# Patient Record
Sex: Male | Born: 1935 | Race: White | Hispanic: No | Smoking: Former smoker
Health system: Southern US, Community
[De-identification: ages and names within clinical notes are randomized; demographics above are authoritative.]

## PROBLEM LIST (undated history)

## (undated) DIAGNOSIS — E119 Type 2 diabetes mellitus without complications: Secondary | ICD-10-CM

## (undated) DIAGNOSIS — H9190 Unspecified hearing loss, unspecified ear: Secondary | ICD-10-CM

## (undated) DIAGNOSIS — I2111 ST elevation (STEMI) myocardial infarction involving right coronary artery: Secondary | ICD-10-CM

## (undated) DIAGNOSIS — C679 Malignant neoplasm of bladder, unspecified: Secondary | ICD-10-CM

## (undated) DIAGNOSIS — I2510 Atherosclerotic heart disease of native coronary artery without angina pectoris: Secondary | ICD-10-CM

## (undated) DIAGNOSIS — F0390 Unspecified dementia without behavioral disturbance: Secondary | ICD-10-CM

## (undated) DIAGNOSIS — C4490 Unspecified malignant neoplasm of skin, unspecified: Secondary | ICD-10-CM

## (undated) DIAGNOSIS — I10 Essential (primary) hypertension: Secondary | ICD-10-CM

## (undated) HISTORY — DX: ST elevation (STEMI) myocardial infarction involving right coronary artery: I21.11

## (undated) HISTORY — PX: EYE SURGERY: SHX253

## (undated) HISTORY — PX: PROSTATE SURGERY: SHX751

## (undated) HISTORY — PX: HERNIA REPAIR: SHX51

## (undated) HISTORY — PX: APPENDECTOMY: SHX54

## (undated) HISTORY — PX: CHOLECYSTECTOMY: SHX55

---

## 1998-02-12 ENCOUNTER — Encounter: Admission: RE | Admit: 1998-02-12 | Discharge: 1998-05-13 | Payer: Self-pay | Admitting: Internal Medicine

## 2000-06-29 DIAGNOSIS — C4490 Unspecified malignant neoplasm of skin, unspecified: Secondary | ICD-10-CM

## 2000-06-29 HISTORY — DX: Unspecified malignant neoplasm of skin, unspecified: C44.90

## 2005-08-19 ENCOUNTER — Encounter: Admission: RE | Admit: 2005-08-19 | Discharge: 2005-08-19 | Payer: Self-pay | Admitting: Orthopedic Surgery

## 2005-08-20 ENCOUNTER — Ambulatory Visit (HOSPITAL_BASED_OUTPATIENT_CLINIC_OR_DEPARTMENT_OTHER): Admission: RE | Admit: 2005-08-20 | Discharge: 2005-08-20 | Payer: Self-pay | Admitting: Orthopedic Surgery

## 2006-06-29 DIAGNOSIS — I2111 ST elevation (STEMI) myocardial infarction involving right coronary artery: Secondary | ICD-10-CM

## 2006-06-29 HISTORY — PX: CORONARY ANGIOPLASTY WITH STENT PLACEMENT: SHX49

## 2006-06-29 HISTORY — DX: ST elevation (STEMI) myocardial infarction involving right coronary artery: I21.11

## 2006-12-18 ENCOUNTER — Ambulatory Visit: Payer: Self-pay | Admitting: *Deleted

## 2006-12-18 ENCOUNTER — Inpatient Hospital Stay (HOSPITAL_COMMUNITY): Admission: EM | Admit: 2006-12-18 | Discharge: 2006-12-20 | Payer: Self-pay | Admitting: Emergency Medicine

## 2006-12-30 ENCOUNTER — Ambulatory Visit: Payer: Self-pay | Admitting: Cardiology

## 2007-01-05 ENCOUNTER — Ambulatory Visit (HOSPITAL_COMMUNITY): Admission: RE | Admit: 2007-01-05 | Discharge: 2007-01-06 | Payer: Self-pay | Admitting: Urology

## 2007-01-18 ENCOUNTER — Ambulatory Visit: Payer: Self-pay | Admitting: Cardiology

## 2007-01-18 LAB — CONVERTED CEMR LAB
AST: 18 units/L (ref 0–37)
Alkaline Phosphatase: 102 units/L (ref 39–117)
Bilirubin, Direct: 0.1 mg/dL (ref 0.0–0.3)
Chloride: 101 meq/L (ref 96–112)
Glucose, Bld: 185 mg/dL — ABNORMAL HIGH (ref 70–99)
HDL: 24.7 mg/dL — ABNORMAL LOW (ref >39.0)
LDL Cholesterol: 48 mg/dL (ref 0–99)
Potassium: 4.3 meq/L (ref 3.5–5.1)
Total Bilirubin: 1.3 mg/dL — ABNORMAL HIGH (ref 0.3–1.2)
Total CHOL/HDL Ratio: 4.5
Total Protein: 7.1 g/dL (ref 6.0–8.3)

## 2008-03-07 ENCOUNTER — Emergency Department (HOSPITAL_COMMUNITY): Admission: EM | Admit: 2008-03-07 | Discharge: 2008-03-07 | Payer: Self-pay | Admitting: Emergency Medicine

## 2008-03-09 ENCOUNTER — Emergency Department (HOSPITAL_COMMUNITY): Admission: EM | Admit: 2008-03-09 | Discharge: 2008-03-09 | Payer: Self-pay | Admitting: Family Medicine

## 2009-04-23 ENCOUNTER — Ambulatory Visit: Admission: RE | Admit: 2009-04-23 | Discharge: 2009-06-12 | Payer: Self-pay | Admitting: Radiation Oncology

## 2010-11-11 NOTE — Op Note (Signed)
NAME:  NUR, KRASINSKI                 ACCOUNT NO.:  [id number]   MEDICAL RECORD NO.:  [id number]          PATIENT TYPE:  OIB   LOCATION:  1406                         FACILITY:  [facility]   PHYSICIAN:  Sigmund I. Tannenbaum, M.D.DATE OF BIRTH:  1926/12/16   DATE OF PROCEDURE:  DATE OF DISCHARGE:                               OPERATIVE REPORT   PREOP DIAGNOSIS:  Acute urinary retention secondary to clot retention,  secondary to Plavix for cardiac stent.   POSTOP DIAGNOSIS:  Acute urinary retention secondary to clot retention,  secondary to Plavix for cardiac stent.   OPERATION:  Cystourethroscopy, clot evacuation, catheterization.   SURGEON:  Tannenbaum.   ANESTHESIA:  General LMA.   PREPARATION:  After appropriate preanesthesia, the patient is brought to  the operating room, placed on the operating table in the dorsal supine  position where general LMA anesthesia was induced.  He was then replaced  in the dorsal lithotomy position.  The pubis was prepped with Betadine  solution and draped in the usual fashion.   HISTORY:  This 77 year old male was status post TURP approximately 20  years per Dr. Etta Grandchild.  More recently, the patient has had a cardiac stent  placed, on Plavix.  He developed difficulty voiding over the weekend,  with gross hematuria.  He currently has urinary clot retention, and  attempts at catheterization in the office have failed.  Cystoscopy in  the office failed because of massive of clot retention.  The patient has  significant pain as well as increased pulse rate, and he is considered  emergency for surgical evacuation of bladder clot.   PROCEDURE:  Cystourethroscopy was accomplished, and the bladder neck  with contracture as identified.  A large amount of clot is identified.  The scope was placed in the bladder, the bladder evacuated of at least 1  unit of old crank case oil blood, along with more recent clots.  Following complete irrigation of clots, repeat  cystoscopy was  accomplished, and showed massive trabeculation, cellule formation, with  regrowth of prostate, bladder neck  contracture.  A guidewire was placed in the bladder, and a 24- Ainsworth  catheter was placed into the bladder with some difficulty.  Irrigation  was accomplished and showed clear results.  The patient was then  awakened and taken to recovery room in good condition.  20 mL were  placed in the Ainsworth catheter.      Sigmund I. Patsi Sears, M.D.  Electronically Signed     SIT/MEDQ  D:  01/05/2007  T:  01/06/2007  Job:  [id number]   cc:   Larina Earthly, M.D.  Fax: [phone]   Everardo Beals. Juanda Chance, MD, [facility]  [address]  [place], [place] [address]

## 2010-11-11 NOTE — Assessment & Plan Note (Signed)
[name]                            CARDIOLOGY OFFICE NOTE   [name], [name]                        MRN:          [id number]  DATE:12/30/2006                            DOB:          12/15/1926    PRIMARY CARE PHYSICIAN:  [name] [name], M.D.   CLINICAL HISTORY:  [name] [name] is 77 years old and came to [facility] via EMS on June 21 with an acute diaphragmatic wall infarction.  He was taken to the cath lab and we stented a subtotal lesion in the  posterior descending branch of the right coronary artery.  He had  residual 80% ostial narrowing in the marginal branch and 80% narrowing  in the distal circumflex artery, which we felt should best be treated  medically.  His overall LV function was good with estimated ejection  fraction of 60%.   He has done quite well since that time, has had no recent chest pain,  shortness of breath or palpitations.  He has been out working in the  yard a little bit already.   PAST MEDICAL HISTORY:  Significant for hyperlipidemia and diabetes.   He has been intolerant to lisinopril.   CURRENT MEDICATIONS INCLUDE:  1. Aspirin.  2. Plavix.  3. Toprol XL 50 mg daily.  4. Avapro 75 mg daily.  5. Lipitor 80 mg daily.  6. Glipizide.   SOCIAL HISTORY:  His wife died in 15-Jul-2022.  They had been married for 57  years.  She had diabetes and amputations and was on dialysis.   ON EXAMINATION:  The blood pressure is 161/78 and the pulse 78 and  regular.  There was no jugular venous distention.  The carotid pulses  were full without bruit.  Chest was clear without rales or rhonchi.  Cardiac rhythm was regular.  The heart sounds were normal, there were no  murmurs or gallops.  There abdomen was soft, without organomegaly.  Peripheral pulses were full and there was no peripheral edema.   Electrocardiogram showed recent diaphragmatic wall infarction.   IMPRESSION:  1. Recent diaphragmatic wall infarction (December 18, 2006), treated with      a Taxus drug eluting stent to the posterior descending branch of      the right coronary artery with residual 80% ostial stenosis of a      marginal branch and 80% distal stenosis in the circumflex artery.  2. Good left ventricular function with ejection fraction of 60%.  3. Diabetes.  4. Hypertension.  5. Hyperlipidemia.   RECOMMENDATIONS:  I think [name] [name] is doing quite well.  We will plan  to check a lipid profile in three weeks to follow up on his recent  initiation of Lipitor.  We will get liver function tests at the same  time.  He has a followup to see [name] [name] in two months and I will plan  to see him back in three months.  His blood pressure was somewhat  elevated today and his heart rate somewhat elevated, as well, so we will  go up on the Toprol  from 50 to 75 a day.  He is to check his pressures  at home and let us or [name] [name] know if his pressures run more than 140  systolic more than occasionally.     [name] Elvera Lennox Juanda Chance, MD, [name] [name]  Electronically Signed    BRB/MedQ  DD: 12/30/2006  DT: 12/31/2006  Job #: [id number]   cc:   [name] [name], M.D.

## 2010-11-11 NOTE — H&P (Signed)
NAME:  [name], [name] NO.:  [id number]   MEDICAL RECORD NO.:  [id number]          PATIENT TYPE:  INP   LOCATION:  1828                         FACILITY:  [facility]   PHYSICIAN:  Rod Holler, MD     DATE OF BIRTH:  12/03/1926   DATE OF ADMISSION:  12/18/2006  DATE OF DISCHARGE:                              HISTORY & PHYSICAL   PRIMARY CARE Korion Cuevas:  Dr. Felipa Eth.   CHIEF COMPLAINT:  Chest pain.   HISTORY OF PRESENT ILLNESS:  [name] [name] is a 77 year old male with a  history of diabetes mellitus who presented to the emergency department  via EMS with complaints of chest pain.  About an hour prior to  presenting to the emergency department, the patient had onset of left-  sided substernal chest discomfort.  There was no associated nausea,  diaphoresis, shortness of breath  he had never had chest pain prior to  this episode.  At home the patient took 4 baby aspirin.  He had no  complaints of syncope, no presyncope, no palpitations.  He has had no  recent PND or orthopnea, no lower extremity swelling, no dyspnea on  exertion.  EMS was activated, and inferior ST elevation was noted by  EMS.  A code STEMI was activated.  In the emergency department the  patient received heparin 3000 units IV, Pepcid 20 mg IV, Plavix 600 mg  p.o.  He continued to have 5/10 chest discomfort.   PAST MEDICAL HISTORY:  Diabetes mellitus.   MEDICATIONS:  1. Metformin 500 mg p.o. b.i.d.  2. Lisinopril 10 mg p.o. daily.   ALLERGIES:  No known drug allergies.   SOCIAL HISTORY:  The patient is a nonsmoker and lives at home.   FAMILY HISTORY:  No known history of coronary artery disease.   REVIEW OF SYSTEMS:  All systems are reviewed in detail and are negative  except as noted in the History of Present Illness.   PHYSICAL EXAM:  VITAL SIGNS: Heart rate 81, blood pressure 119/60/  GENERAL: Well-developed, well-nourished, alert and oriented x3, no  apparent distress.  HEENT: Atraumatic,  normocephalic.  Pupils equal, round, and reactive to  light.  Extraocular movements intact.  NECK: Supple, no adenopathy, no JVD, no carotid bruits.  CHEST: Lungs clear to auscultation bilaterally with equal bilateral  breath sounds.  CORONARY: Regular rhythm, normal rate, normal S1/S2.  No murmurs, rubs  or gallops, 2+ peripheral pulses, 2+ right femoral pulse without bruit.  ABDOMEN: Soft, nontender, nondistended.  Active bowel sounds.  EXTREMITIES: No clubbing, cyanosis or edema.  NEUROLOGIC: No focal deficits.   EKG shows sinus rhythm, inferior ST elevation with bilateral ST segment  depressions.   LABS:  Pending.   IMPRESSION:  Inferior ST-elevation myocardial infarction.   PLAN:  1. To the cardiac catheterization lab emergently.  2. Admit to CCU.  Serial cardiac enzymes, aspirin, Plavix daily, ACE-      inhibitor daily, beta-blocker, Lipitor daily.  We will check a      lipid panel.  3. Sliding scale insulin given his history of diabetes mellitus.  4. Labs to include CMP, CBC, magnesium level, BMP.      Rod Holler, MD  Electronically Signed     TRK/MEDQ  D:  12/18/2006  T:  12/18/2006  Job:  [id number]

## 2010-11-11 NOTE — Cardiovascular Report (Signed)
NAME:  [name] [name], [name] [name] NO.:  [id number]   MEDICAL RECORD NO.:  [id number]          PATIENT TYPE:  INP   LOCATION:  2033                         FACILITY:  [facility]   PHYSICIAN:  Bruce R. Juanda Chance, MD, FACCDATE OF BIRTH:  05-22-1927   DATE OF PROCEDURE:  12/18/2006  DATE OF DISCHARGE:                            CARDIAC CATHETERIZATION   PROCEDURE:  Cardiac catheterization and percutaneous intervention.   PAST MEDICAL HISTORY:  Mr. [name] [name] is 77 years old and is a retired  Agricultural engineer.  There is no prior history of  heart disease.  He developed chest pain at home and called EMS and was  brought to [facility].  A code STEMI was called en route for  EKG changes of an inferior infarction.  He was seen by Dr. Joaquim Lai,  our cardiology fellow; and transported promptly to the catheterization  laboratory.   DESCRIPTION OF PROCEDURE:  The procedure was performed via right femoral  artery and arterial sheath and 6-French preformed coronary catheters.  A  front wall arterial puncture was performed and Omnipaque contrast was  used.  After completion of the diagnostic study I made decision to  proceed with intervention on the lesion in the posterior descending  branch in the right coronary.   The patient had been given chewable aspirin and Plavix earlier.  We gave  Angiomax bolus and infusion.  We chose a right saphenous vein bypass  graft guiding catheter 6-French with side holes.  We passed a Prowater  wire down the vessel and across the lesion without difficulty.  We  predilated with a 2.25 x 20 mm Maverick balloon performing two  inflations up to 8 atmospheres for 30 seconds.  This resulted in some  slow flow which was treated by intracoronary verapamil.  (TIMI 2 flow).  We then deployed a 2.5 x 20 mm Taxus stent deploying this with one  inflation of 10 atmospheres for 30 seconds.  We postdilated with a 2.75  x 50 mm Quantum Maverick  performing two inflations up to 16 atmospheres  for 30 seconds.  Final diagnostic was then performed through the guiding  catheter.  The patient tolerated the procedure well; and left the  laboratory in satisfactory condition.   RESULTS:  1. The aortic pressure was 126/59 with a mean of 85; and the left      ventricle pressure was 126/14.  2. LEFT MAIN CORONARY ARTERY:  The left main coronary artery was free      of significant disease.  3. LEFT ANTERIOR DESCENDING ARTERY:  The left anterior descending      artery gave rise to three diagonal branches and a septal      perforator.  There was 30% of the proximal vessel and      irregularities in the proximal-and-mid vessel.  4. CIRCUMFLEX ARTERY:  The circumflex artery was a small-to-moderate-      sized vessel that gave rise to a marginal branch and a      posterolateral branch.  There was 80% osteal stenosis in the  marginal branch.  There was 80% stenosis in the mid-to-distal      vessel.  This vessel was a small caliber vessel that was 2.25 at      most in diameter.  5. RIGHT CORONARY ARTERY:  The right coronary artery is a moderate-      sized vessel that gave rise to a conus branch, a right ventricle      branch, a posterior descending branch, and two small posterolateral      branches.  There was 30% narrowing in the proximal right coronary      artery.  There was 70% ostial narrowing in a right ventricle      branch.  There was 95% narrowing in the proximal-to-mid, posterior,      descending branch.   LEFT VENTRICULOGRAM:  1. The left ventriculogram performed in the RAO projection showed      hypokinesis of the mid inferior wall.  The overall wall motion was      good with an estimated fraction of 60%.  2. The left ventriculogram performed in the LAO projection showed good      wall motion with no areas of hypokinesis.   Following stenting of the lesion in the posterior descending branch of  the right coronary artery,  stenosis improved from 95% to 0% and the flow  was TIMI 3; before, and at the end of intervention.   The patient had the onset of chest pain at 1730, and arrived in the  emergency department at 1826.  He arrived in the cath lab at 1850.  First balloon inflation was at 1921.  This gave a door to balloon time  of 85 minutes and refusion time of 1 hour 51 minutes.   CONCLUSION:  1. Acute inferior wall myocardial infarction with 95% stenosis in the      posterior branch of the right coronary artery, 30% narrowing in the      proximal right coronary, 30% narrowing in the proximal LAD, 80%      narrowing at the ostium of a first marginal branch of the      circumflex artery, and 80% narrowing in the mid distal circumflex      artery with mild mid inferior wall hypokinesis and estimated      fraction of 60%.  2. Successful PCI of the lesion in the posterior branch of the right      coronary using a Taxus drug-eluting stent with improvement in the      central narrowing from 95% to 0%.   DISPOSITION:  The patient was returned to the recovery room for further  observation.  I will review with my colleagues whether we should  consider intervention on the lesion in the mid-to-distal circumflex  artery.      Bruce Elvera Lennox Juanda Chance, MD, [facility]  Electronically Signed     BRB/MEDQ  D:  12/18/2006  T:  12/19/2006  Job:  [id number]   cc:   Larina Earthly, M.D.  Cardiopulmonary Lab

## 2010-11-11 NOTE — Discharge Summary (Signed)
NAME:  [name] [name], [name] [name] NO.:  [id number]   MEDICAL RECORD NO.:  [id number]          PATIENT TYPE:  INP   LOCATION:  2033                         FACILITY:  [facility]   PHYSICIAN:  Bruce R. Juanda Chance, MD, FACCDATE OF BIRTH:  1926/12/17   DATE OF ADMISSION:  12/18/2006  DATE OF DISCHARGE:  12/20/2006                         DISCHARGE SUMMARY - REFERRING   DISCHARGE DIAGNOSES:  1. Acute inferior myocardial infarction.  2. Coronary artery disease.  3. Status post drug-eluting stent to the posterior descending artery.  4. Hyperglycemia with a elevated hemoglobin A1c.  5. Hyperlipidemia.  6. Intolerance to lisinopril secondary to weakness.   SUMMARY OF HISTORY:  [name] [name] is a 77 year old male who presented to  the emergency room via EMS with chest discomfort that started an hour  prior to presentation.  He did not have any associated symptoms.  EKG on  arrival showed inferior ST-segment elevation and code STEMI was  activated.   PAST MEDICAL HISTORY:  Notable for diabetes.   LABORATORY DATA:  Chest x-ray on December 18, 2006, showed no acute  processes.   Admission H&H was 14.3 and 42, normal indices, platelets 331, WBCs 9.1.  On December 19, 2006, H&H was 12.4 and 36.4, normal indices, platelets 302,  WBCs 9.7.  Admission sodium was 137, potassium of 3.9, BUN 14,  creatinine 0.9, glucose 186.  On December 19, 2006, sodium was 137,  potassium 4.2, BUN 10, creatinine 0.81, glucose 227.  Normal LFTs on  admission.  Hemoglobin A1c was elevated at 7.3.  Initial CK-MB was 187  and 11.4 with a relative index 6.1, troponin of 0.42.  Second CK was  565, MB 64.8, relative index 11.5, troponin 23.71.  Subsequent CK total,  Modified barium swallow, relative indexes and troponins were declining.  BNP was 84.  Fasting lipids on December 19, 2006, showed a total cholesterol  162, triglycerides 276, HDL 26, LDL 81.   EKGs on December 18, 2006, showed inferior ST-segment elevations as well as  in V6,  he ST-segment depression in I and aVL.   HOSPITAL COURSE:  [name] [name] was taken emergently to the cardiac  catheterization lab by Dr. Charlies Constable.  Catheterization showed EF of  60% with inferior hypokinesis.  He had 30% proximal LAD with some  irregularities, 80% OM-1, 80% mid circumflex, 30% proximal RCA, 70%  branch off the RCA,  the 95% PDA.  Dr. Juanda Chance performed Taxus stenting  reducing the 95% lesion to 0% restoring TIMI-3 flow.  Dr. Juanda Chance  commented that he would review with his colleagues on Monday if they  should consider intervention on the circumflex.  Dr. Juanda Chance also noted  that the patient stated that he could not take an ACE inhibitor,  however, prior to admission he was taking an ACE inhibitor.  Overnight  he did not have any further chest discomfort.  Medications were  adjusted.  Metformin was placed on hold given his cardiac  catheterization.  Dr. Antoine Poche noted that the lisinopril that he was on  prior to admission had recently been started and that he was complaining  of general weakness, thus this was discontinued.  He was placed on an  ARB.  By December 20, 2006, the patient was ambulating without difficulty.  Catheterization site was intact and Dr. Juanda Chance felt that the patient  could be discharged home.   PROCEDURES PERFORMED:  Cardiac catheterization and drug-eluting stent to  the PDA by Dr. Juanda Chance on December 18, 2006.   DISPOSITION:  [name] [name] is discharged home.  He is asked to avoid  lifting, driving, sexual activity or heavy exertion for 2 weeks.  Wound  care is per supplemental sheet.  He will follow low-sodium heart-healthy  ADA diet.   NEW MEDICATIONS:  1. Aspirin 325 daily.  2. Plavix 75 daily.  3. Toprol XL 50 daily.  4. Avapro 75 daily.  5. Lipitor 80 mg nightly.  6. Glipizide 5 mg daily.  7. Nitroglycerin 0.4 as needed.  8. Lorazepam 0.5 t.i.d.  9. He was advised not to take metformin or lisinopril.   FOLLOW UP:  He will follow up with Dr. Juanda Chance  on December 30, 2006, at 3:45  p.m.  He is asked to arrange a 3-week appointment with Dr. Felipa Eth to  follow up on his diabetes.  He will need blood work in approximately 6-8  weeks, given the initiation of Lipitor.  He was also asked to bring all  medications to all appointments.  Prior to discharge, I will arrange for  cardiac rehab to see the patient.   DISCHARGE TIME:  35 minutes.      Joellyn Rued, PA-C      Bruce R. Juanda Chance, MD, [facility]  Electronically Signed    EW/MEDQ  D:  12/20/2006  T:  12/20/2006  Job:  [id number]   cc:   Larina Earthly, M.D.

## 2010-11-14 NOTE — Op Note (Signed)
NAME:  [name], [name]                 ACCOUNT NO.:  [id number]   MEDICAL RECORD NO.:  [id number]          PATIENT TYPE:  AMB   LOCATION:  [facility]                         [facility]ACILITY[facility]  [facility]   PHYSICIAN:  Ollen Gross, M.D.    DATE OF BIRTH:  Aug 10, 1926   DATE OF PROCEDURE:  08/20/2005  DATE OF DISCHARGE:                                 OPERATIVE REPORT   PREOPERATIVE DIAGNOSIS:  Left knee medial meniscal tear.   POSTOPERATIVE DIAGNOSIS:  Left knee medial meniscal tear plus osteochondral  defect medial femoral condyle.   PROCEDURE:  Left knee arthroscopy with meniscal debridement and  chondroplasty.   SURGEON:  Dr. Lequita Halt   ASSISTANT:  None.   ANESTHESIA:  Local with MAC.   ESTIMATED BLOOD LOSS:  Minimal.   DRAINS:  None.   COMPLICATIONS:  None.   CONDITION:  Stable to recovery.   BRIEF CLINICAL NOTE:  [name] Cook is a 77 year old male, who has had a  several month history of progressively worsening left knee pain and  mechanical symptoms.  MRI suggested a medial meniscal tear plus  osteochondral defect, and he presents now for arthroscopy and debridement.   PROCEDURE IN DETAIL:  After the successful administration of local with MAC  anesthetic, a tourniquet is placed high on the left thigh and left lower  extremity is prepped and draped in the usual sterile fashion.  Standard  superomedial and inferolateral incisions are made, inflow cannula passed  superomedial and camera passed inferolateral.  Arthroscopic visualization  proceeds.  The undersurface of the patella looks fairly normal with some  grade 2 change of the cartilage but no full-thickness defects or unstable  cartilage.  Trochlea similarly had some grade 2 change but again no full-  thickness defects or unstable cartilage.  Medial and lateral gutters are  visualized.  There are no loose bodies.  Flexion and valgus force is applied  to the knee, and the medial compartment is entered.  It does have  degeneration and a  small tear in the posterior horn of the medial meniscus.  Spinal needles used to localize the inferomedial portal, a small incision  made, dilator placed and probe placed.  The tear is unstable.  It is  debrided back to a stable base with baskets and a 4.2 mm shaver.  On the  femoral surface, there is an area of gross instability in the articular  cartilage.  There is about a 2 x 2 cm area posteriorly.  I probed this, and  the cartilage as well as some bone were coming out of the defect in the bone  itself.  We debrided this back to a stable bony base with stable  cartilaginous edges.  A size of about 2 x 2 cm.  It is again probed, and the  edges are found to be stable.  Intercondylar notch is then visualized; ACL  is normal.  Lateral compartment is normal also.  The arthroscopic  equipment is removed from the inferior portals which are closed with  interrupted 4-0 nylon.  Marcaine 0.25% 20 mL with epinephrine are injected  through the inflow cannula, and then that is removed and that portal closed  with nylon.  Bulky sterile dressing is applied.  He is awakened and  transported to recovery in stable condition.      Ollen Gross, M.D.  Electronically Signed     FA/MEDQ  D:  08/20/2005  T:  08/21/2005  Job:  [id number]

## 2011-04-14 LAB — BASIC METABOLIC PANEL
BUN: 12
CO2: 22
Calcium: 9.1
Chloride: 104
Creatinine, Ser: 0.9
GFR calc Af Amer: >60
Glucose, Bld: 198 — ABNORMAL HIGH

## 2011-04-14 LAB — APTT
aPTT: 24
aPTT: 29

## 2011-04-14 LAB — HEMOGLOBIN AND HEMATOCRIT, BLOOD: HCT: 35.9 — ABNORMAL LOW

## 2011-04-14 LAB — PROTIME-INR: Prothrombin Time: 14.5

## 2011-04-15 LAB — COMPREHENSIVE METABOLIC PANEL
ALT: 17
ALT: 26
Albumin: 3.7
BUN: 10
Calcium: 8.7
Chloride: 105
Chloride: 105
Creatinine, Ser: 0.78
GFR calc Af Amer: >60
GFR calc non Af Amer: >60
Potassium: 3.8
Potassium: 4.2
Sodium: 135
Sodium: 137
Total Bilirubin: 1
Total Protein: 6.9

## 2011-04-15 LAB — TROPONIN I: Troponin I: 0.42 — ABNORMAL HIGH

## 2011-04-15 LAB — POCT CARDIAC MARKERS
Operator id: 161631
Troponin i, poc: 0.28 — ABNORMAL HIGH

## 2011-04-15 LAB — LIPID PANEL
HDL: 26 — ABNORMAL LOW
Total CHOL/HDL Ratio: 6.2
Triglycerides: 276 — ABNORMAL HIGH

## 2011-04-15 LAB — CBC
HCT: 36.4 — ABNORMAL LOW
Hemoglobin: 12.4 — ABNORMAL LOW
Hemoglobin: 13.4
MCV: 91.3
RDW: 13.4
WBC: 9.1
WBC: 9.7

## 2011-04-15 LAB — I-STAT 8, (EC8 V) (CONVERTED LAB)
Acid-Base Excess: 2
BUN: 14
Bicarbonate: 23.8
Glucose, Bld: 186 — ABNORMAL HIGH
HCT: 42
Operator id: 161631

## 2011-04-15 LAB — B-NATRIURETIC PEPTIDE (CONVERTED LAB): Pro B Natriuretic peptide (BNP): 84

## 2011-04-15 LAB — POCT I-STAT CREATININE
Creatinine, Ser: 0.9
Operator id: 161631

## 2011-04-15 LAB — DIFFERENTIAL
Basophils Absolute: 0
Eosinophils Relative: 2
Lymphocytes Relative: 28
Monocytes Absolute: 0.6
Monocytes Relative: 6
Neutro Abs: 5.7
Neutrophils Relative %: 63

## 2011-04-15 LAB — CARDIAC PANEL(CRET KIN+CKTOT+MB+TROPI): Relative Index: 10.4 — ABNORMAL HIGH

## 2011-04-15 LAB — HEMOGLOBIN A1C: Hgb A1c MFr Bld: 7.3 — ABNORMAL HIGH

## 2011-04-15 LAB — MAGNESIUM: Magnesium: 1.7

## 2012-08-05 ENCOUNTER — Encounter (HOSPITAL_COMMUNITY): Payer: Self-pay | Admitting: Emergency Medicine

## 2012-08-05 ENCOUNTER — Emergency Department (HOSPITAL_COMMUNITY): Payer: Medicare Other

## 2012-08-05 ENCOUNTER — Emergency Department (HOSPITAL_COMMUNITY)
Admission: EM | Admit: 2012-08-05 | Discharge: 2012-08-05 | Disposition: A | Discharge status: Home / Self Care | Payer: Medicare Other | Attending: Emergency Medicine | Admitting: Emergency Medicine

## 2012-08-05 DIAGNOSIS — W19XXXA Unspecified fall, initial encounter: Secondary | ICD-10-CM

## 2012-08-05 DIAGNOSIS — S0181XA Laceration without foreign body of other part of head, initial encounter: Secondary | ICD-10-CM

## 2012-08-05 DIAGNOSIS — Z23 Encounter for immunization: Secondary | ICD-10-CM | POA: Insufficient documentation

## 2012-08-05 DIAGNOSIS — S0990XA Unspecified injury of head, initial encounter: Secondary | ICD-10-CM

## 2012-08-05 DIAGNOSIS — W010XXA Fall on same level from slipping, tripping and stumbling without subsequent striking against object, initial encounter: Secondary | ICD-10-CM | POA: Insufficient documentation

## 2012-08-05 DIAGNOSIS — Y929 Unspecified place or not applicable: Secondary | ICD-10-CM | POA: Insufficient documentation

## 2012-08-05 DIAGNOSIS — Y939 Activity, unspecified: Secondary | ICD-10-CM | POA: Insufficient documentation

## 2012-08-05 DIAGNOSIS — Z9861 Coronary angioplasty status: Secondary | ICD-10-CM | POA: Insufficient documentation

## 2012-08-05 DIAGNOSIS — E119 Type 2 diabetes mellitus without complications: Secondary | ICD-10-CM | POA: Insufficient documentation

## 2012-08-05 DIAGNOSIS — IMO0002 Reserved for concepts with insufficient information to code with codable children: Secondary | ICD-10-CM | POA: Insufficient documentation

## 2012-08-05 HISTORY — DX: Type 2 diabetes mellitus without complications: E11.9

## 2012-08-05 MED ORDER — BACITRACIN ZINC 500 UNIT/GM EX OINT
1.0000 "application " | TOPICAL_OINTMENT | Freq: Two times a day (BID) | CUTANEOUS | Status: DC
  Administered 2012-08-05: 1 via TOPICAL
  Filled 2012-08-05 (×3): qty 0.9

## 2012-08-05 MED ORDER — TETANUS-DIPHTH-ACELL PERTUSSIS 5-2.5-18.5 LF-MCG/0.5 IM SUSP
0.5000 mL | Freq: Once | INTRAMUSCULAR | Status: AC
  Administered 2012-08-05: 0.5 mL via INTRAMUSCULAR
  Filled 2012-08-05: qty 0.5

## 2012-08-05 NOTE — ED Notes (Signed)
Pt reports "fell on the curb and hit my knee and my head." Pt has a wound to upper left forehead area, bleeding controlled at this time.

## 2012-08-05 NOTE — ED Provider Notes (Signed)
History     CSN: [id number]  Arrival date & time 08/05/12  1206   First MD Initiated Contact with Patient 08/05/12 1244      Chief Complaint  Patient presents with  . Fall    (Consider location/radiation/quality/duration/timing/severity/associated sxs/prior treatment) HPI  [name] Cook is a 79 y.o. male with past medical history and diabetes complaining of slip and fall when getting out of his car this a.m. Patient had head trauma and has a laceration to the lateral left temple. Patient denies loss of consciousness, headache, nausea vomiting, change in vision, neck pain,  chest pain, shortness of breath, abdominal pain. He also has a abrasion to the left knee. Denies decreased range of motion or difficulty ambulating. Patient rates his pain as mild and refuses pain medication at this time. Patient does not take any anticoagulants or daily aspirin.  Past Medical History  Diagnosis Date  . Stented coronary artery   . Diabetes mellitus without complication     Past Surgical History  Procedure Date  . Coronary angioplasty with stent placement     No family history on file.  History  Substance Use Topics  . Smoking status: Not on file  . Smokeless tobacco: Not on file  . Alcohol Use: No      Review of Systems  Constitutional: Negative for fever.  Respiratory: Negative for shortness of breath.   Cardiovascular: Negative for chest pain.  Gastrointestinal: Negative for nausea, vomiting, abdominal pain and diarrhea.  Skin: Positive for wound.  Neurological: Negative for syncope, speech difficulty, numbness and headaches.  All other systems reviewed and are negative.    Allergies  Review of patient's allergies indicates no known allergies.  Home Medications  No current outpatient prescriptions on file.  BP 169/86  Pulse 118  Temp 98.7 F (37.1 C) (Oral)  Resp 18  SpO2 99%  Physical Exam  Nursing note and vitals reviewed. Constitutional: He is oriented to  person, place, and time. He appears well-developed and well-nourished. No distress.  HENT:  Head: Normocephalic.    Mouth/Throat: Oropharynx is clear and moist.       No tenderness to palpation or crepitus to orbital bones. EOMI, no Pain with movement  Eyes: Conjunctivae normal and EOM are normal. Pupils are equal, round, and reactive to light.  Cardiovascular: Normal rate, regular rhythm, normal heart sounds and intact distal pulses.   Pulmonary/Chest: Effort normal and breath sounds normal. No stridor. No respiratory distress. He has no wheezes. He has no rales. He exhibits no tenderness.  Abdominal: Soft. Bowel sounds are normal. He exhibits no distension and no mass. There is no tenderness. There is no rebound and no guarding.  Musculoskeletal: Normal range of motion.       Left Knee: No deformity. FROM. No effusion or crepitance. Anterior and posterior drawer show no abnormal laxity. Stable to valgus and varus stress. Joint lines are non-tender. Neurovascularly intact. Pt ambulates with non-antalgic gait.   Neurological: He is alert and oriented to person, place, and time.       Strength is 5 out of 5x4 extremities, distal sensation is grossly intact, finger to nose and heel-to-shin coordinated. Patient and relates with a coordinated gait  Skin:     Psychiatric: He has a normal mood and affect.    ED Course  Procedures (including critical care time)  LACERATION REPAIR Performed by: Wynetta Emery Authorized by: Wynetta Emery Consent: Verbal consent obtained. Risks and benefits: risks, benefits and alternatives were discussed Consent  given by: patient Patient identity confirmed: Wrist band  Prepped and Draped in normal sterile fashion  Tetanus: Updated today   Laceration Location: Left temple  Laceration Length: 1.5 cm  Anesthesia: Local   Local anesthetic: 2% with epinephrine  Anesthetic total: 3 ml  Irrigation method: syringe  Amount of cleaning: copious     Wound explored to depth in good light on a bloodless field with no foreign bodies seen or palpated.   Skin closure: 6-0 polypropylene  Number of sutures: 3   Technique:  Simple interrupted   Patient tolerance: Patient tolerated the procedure well with no immediate complications.  Antibx ointment applied. Instructions for care discussed verbally and patient provided with additional written instructions for homecare and f/u.  Labs Reviewed - No data to display Dg Knee 2 Views Left  08/05/2012  *RADIOLOGY REPORT*  Clinical Data: 79 year old male status post fall with left knee pain.  LEFT KNEE - 1-2 VIEW  Comparison: None.  Findings: No joint effusion identified.  The heart joint space loss and moderate to severe irregularity of the articular surface of the femoral medial condyle - the articular surface is flattened with subchondral sclerosis.  Associated medial compartment degenerative spurring.  Other joint spaces appear relatively preserved.  No other fracture or dislocation identified.  Calcified atherosclerosis.  IMPRESSION: Degenerative changes at the medial compartment with osteochondral defect of the medial femoral condyle.  Otherwise no acute osseous abnormality.   Original Report Authenticated By: Erskine Speed, M.D.    Ct Head Wo Contrast  08/05/2012  *RADIOLOGY REPORT*  Clinical Data:  Fall  CT HEAD WITHOUT CONTRAST CT CERVICAL SPINE WITHOUT CONTRAST  Technique:  Multidetector CT imaging of the head and cervical spine was performed following the standard protocol without intravenous contrast.  Multiplanar CT image reconstructions of the cervical spine were also generated.  Comparison:   None  CT HEAD  Findings: Generalized atrophy.  Chronic microvascular ischemia in the white matter.  No acute infarct.  Negative for hemorrhage or mass or skull fracture.  IMPRESSION: Atrophy and chronic microvascular ischemia.  No acute abnormality.  CT CERVICAL SPINE  Findings: Negative for fracture.   Normal alignment.  Diffuse cervical spondylosis is present.  There is uncinate spurring and facet degeneration throughout the cervical spine. Central disc protrusions at C3-4 and C4-5 and C5-6.  C3-4:  Disc degeneration and spondylosis.  Central disc protrusion. Facet degeneration and moderate spinal stenosis.  Foraminal narrowing bilaterally.  C4-5:  Disc degeneration and spondylosis.  Mild facet hypertrophy. Moderate to severe spinal stenosis.  Moderate foraminal encroachment bilaterally.  C5-6:  Central disc protrusion.  Diffuse uncinate spurring with moderate spinal stenosis.  Left foraminal narrowing.  C6-7:  Central disc protrusion.  There is spondylosis and foraminal encroachment bilaterally.  Moderate spinal stenosis.   Bilateral carotid artery calcification.  IMPRESSION: Moderate to severe degenerative changes.  Negative for cervical spine fracture.   Original Report Authenticated By: Janeece Riggers, M.D.    Ct Cervical Spine Wo Contrast  08/05/2012  *RADIOLOGY REPORT*  Clinical Data:  Fall  CT HEAD WITHOUT CONTRAST CT CERVICAL SPINE WITHOUT CONTRAST  Technique:  Multidetector CT imaging of the head and cervical spine was performed following the standard protocol without intravenous contrast.  Multiplanar CT image reconstructions of the cervical spine were also generated.  Comparison:   None  CT HEAD  Findings: Generalized atrophy.  Chronic microvascular ischemia in the white matter.  No acute infarct.  Negative for hemorrhage or mass or skull fracture.  IMPRESSION: Atrophy and chronic microvascular ischemia.  No acute abnormality.  CT CERVICAL SPINE  Findings: Negative for fracture.  Normal alignment.  Diffuse cervical spondylosis is present.  There is uncinate spurring and facet degeneration throughout the cervical spine. Central disc protrusions at C3-4 and C4-5 and C5-6.  C3-4:  Disc degeneration and spondylosis.  Central disc protrusion. Facet degeneration and moderate spinal stenosis.  Foraminal  narrowing bilaterally.  C4-5:  Disc degeneration and spondylosis.  Mild facet hypertrophy. Moderate to severe spinal stenosis.  Moderate foraminal encroachment bilaterally.  C5-6:  Central disc protrusion.  Diffuse uncinate spurring with moderate spinal stenosis.  Left foraminal narrowing.  C6-7:  Central disc protrusion.  There is spondylosis and foraminal encroachment bilaterally.  Moderate spinal stenosis.   Bilateral carotid artery calcification.  IMPRESSION: Moderate to severe degenerative changes.  Negative for cervical spine fracture.   Original Report Authenticated By: Janeece Riggers, M.D.      1. Facial laceration   2. Head trauma   3. Fall       MDM  Patient with negative head and cervical spine CT. Tetanus is updated and patient lacerations are closed. Return precautions discussed with both patient and daughter.    Filed Vitals:   08/05/12 1223  BP: 169/86  Pulse: 118  Temp: 98.7 F (37.1 C)  TempSrc: Oral  Resp: 18  SpO2: 99%     Pt verbalized understanding and agrees with care plan. Outpatient follow-up and return precautions given.    I personally performed the services described in this documentation, which was scribed in my presence. The recorded information has been reviewed and is accurate.         Wynetta Emery, PA-C 08/06/12 1439

## 2012-08-06 NOTE — ED Provider Notes (Signed)
Medical screening examination/treatment/procedure(s) were conducted as a shared visit with non-physician practitioner(s) and myself.  I personally evaluated the patient during the encounter  Seri Kimmer, MD 08/06/12 1825 

## 2012-08-12 ENCOUNTER — Emergency Department (HOSPITAL_COMMUNITY)
Admission: EM | Admit: 2012-08-12 | Discharge: 2012-08-12 | Disposition: A | Discharge status: Home / Self Care | Payer: Medicare Other

## 2014-09-05 ENCOUNTER — Emergency Department (HOSPITAL_COMMUNITY)
Admission: EM | Admit: 2014-09-05 | Discharge: 2014-09-05 | Disposition: A | Discharge status: Home / Self Care | Payer: Medicare Other | Attending: Emergency Medicine | Admitting: Emergency Medicine

## 2014-09-05 ENCOUNTER — Emergency Department (HOSPITAL_COMMUNITY): Payer: Medicare Other

## 2014-09-05 DIAGNOSIS — M25562 Pain in left knee: Secondary | ICD-10-CM

## 2014-09-05 DIAGNOSIS — Y9389 Activity, other specified: Secondary | ICD-10-CM | POA: Diagnosis not present

## 2014-09-05 DIAGNOSIS — Z79899 Other long term (current) drug therapy: Secondary | ICD-10-CM | POA: Insufficient documentation

## 2014-09-05 DIAGNOSIS — Z9861 Coronary angioplasty status: Secondary | ICD-10-CM | POA: Diagnosis not present

## 2014-09-05 DIAGNOSIS — E119 Type 2 diabetes mellitus without complications: Secondary | ICD-10-CM | POA: Insufficient documentation

## 2014-09-05 DIAGNOSIS — W010XXA Fall on same level from slipping, tripping and stumbling without subsequent striking against object, initial encounter: Secondary | ICD-10-CM | POA: Diagnosis not present

## 2014-09-05 DIAGNOSIS — Y998 Other external cause status: Secondary | ICD-10-CM | POA: Diagnosis not present

## 2014-09-05 DIAGNOSIS — Y92194 Driveway of other specified residential institution as the place of occurrence of the external cause: Secondary | ICD-10-CM | POA: Diagnosis not present

## 2014-09-05 DIAGNOSIS — R296 Repeated falls: Secondary | ICD-10-CM | POA: Diagnosis not present

## 2014-09-05 DIAGNOSIS — S8992XA Unspecified injury of left lower leg, initial encounter: Secondary | ICD-10-CM | POA: Insufficient documentation

## 2014-09-05 LAB — I-STAT CHEM 8, ED
BUN: 20 mg/dL (ref 6–23)
CALCIUM ION: 1.15 mmol/L (ref 1.13–1.30)
CHLORIDE: 103 mmol/L (ref 96–112)
CREATININE: 1 mg/dL (ref 0.50–1.35)
GLUCOSE: 149 mg/dL — AB (ref 70–99)
HCT: 42 % (ref 39.0–52.0)
HEMOGLOBIN: 14.3 g/dL (ref 13.0–17.0)
POTASSIUM: 4.1 mmol/L (ref 3.5–5.1)
SODIUM: 139 mmol/L (ref 135–145)
TCO2: 21 mmol/L (ref 0–100)

## 2014-09-05 MED ORDER — HYDROCODONE-ACETAMINOPHEN 5-325 MG PO TABS
1.0000 | ORAL_TABLET | ORAL | Status: DC | PRN
Start: 2014-09-05 — End: 2016-12-13

## 2014-09-05 MED ORDER — HYDROCODONE-ACETAMINOPHEN 5-325 MG PO TABS
1.0000 | ORAL_TABLET | Freq: Once | ORAL | Status: AC
  Administered 2014-09-05: 1 via ORAL
  Filled 2014-09-05: qty 1

## 2014-09-05 NOTE — ED Provider Notes (Signed)
CSN: [id number]     Arrival date & time 09/05/14  1712 History   First MD Initiated Contact with Patient 09/05/14 1714     Chief Complaint  Patient presents with  . Fall      HPI A Ok Edwards and presents to the emergency department after a fall today.  He was moving from his car and getting up his driveway when he slipped and fell.  He caught himself on the light pole and fell injuring his left knee.  He had a fall with a similar experience yesterday also injuring his left knee.  Family reports his had balance issues over the past several months and refuses to use any assistive devices for walking.  Patient denies head injury.  No neck pain.  No weakness of his arms or legs.  Denies chest pain shortness breath.  No abdominal pain.  Pain in his left knee is mild to moderate in severity.  Denies hip pain.  No altered mental status per family.   Past Medical History  Diagnosis Date  . Stented coronary artery   . Diabetes mellitus without complication    Past Surgical History  Procedure Laterality Date  . Coronary angioplasty with stent placement     No family history on file. History  Substance Use Topics  . Smoking status: Not on file  . Smokeless tobacco: Not on file  . Alcohol Use: No    Review of Systems  All other systems reviewed and are negative.     Allergies  Review of patient's allergies indicates no known allergies.  Home Medications   Prior to Admission medications   Medication Sig Start Date End Date Taking? Authorizing Provider  metoprolol (LOPRESSOR) 50 MG tablet Take 50 mg by mouth daily.   Yes Historical Provider, MD  saxagliptin HCl (ONGLYZA) 5 MG TABS tablet Take 5 mg by mouth daily.   Yes Historical Provider, MD   BP 114/51 mmHg  Pulse 107  Temp(Src) 98.6 F (37 C) (Oral)  Resp 20  SpO2 96% Physical Exam  Constitutional: He is oriented to person, place, and time. He appears well-developed and well-nourished.  HENT:  Head: Normocephalic and atraumatic.   Eyes: EOM are normal.  Neck: Normal range of motion.  Cardiovascular: Normal rate, regular rhythm, normal heart sounds and intact distal pulses.   Pulmonary/Chest: Effort normal and breath sounds normal. No respiratory distress.  Abdominal: Soft. He exhibits no distension. There is no tenderness.  Musculoskeletal: Normal range of motion.  Full range of motion bilateral hips knees and ankles.  Full range of motion bilateral wrists elbows and shoulders.  Mild pain with range of motion of his left knee.  There is a small joint effusion and mild tenderness of the anterior left knee.  Neurological: He is alert and oriented to person, place, and time.  Skin: Skin is warm and dry.  Psychiatric: He has a normal mood and affect. Judgment normal.  Nursing note and vitals reviewed.   ED Course  Procedures (including critical care time) Labs Review Labs Reviewed  I-STAT CHEM 8, ED - Abnormal; Notable for the following:    Glucose, Bld 149 (*)    All other components within normal limits    Imaging Review Dg Knee Complete 4 Views Left  09/05/2014   CLINICAL DATA:  Post fall onto left knee yesterday, now with medial knee pain.  EXAM: LEFT KNEE - COMPLETE 4+ VIEW  COMPARISON:  08/05/2012  FINDINGS: No fracture or dislocation. Severe degenerative change  involving the medial compartment of the right knee with joint space loss, articular surface irregularity and subchondral sclerosis. Again, there is deformity involving the weight-bearing surface of the knee joint space worrisome for an osteochondral defect. No joint effusion. Likely hemarthrosis. There is minimal enthesopathic change involving the superior and inferior poles of the patella. Vascular calcifications.  IMPRESSION: 1. No acute findings. 2. Severe degenerative change involving the medial compartment of the knee with suspected osteochondral defect.   Electronically Signed   By: Sandi Mariscal M.D.   On: 09/05/2014 19:42  I personally reviewed the  imaging tests through PACS system I reviewed available ER/hospitalization records through the EMR    EKG Interpretation None      MDM   Final diagnoses:  None    Mechanical fall.  Patient with frequent balance issues.  I have involved case management and he was given a walker to leave this emergency department.  I recommended that he use a walker for assistance to decrease his fall risk.  We will also be sending her home health team to his house to do a safety eval of the house and for a physical therapy evaluation as well to work on strengthening equilibrium.  X-ray of his left knee is normal.  Labs are without abnormality.  C-spine is cleared by Nexus criteria.  Primary care follow-up.    Jola Schmidt, MD 09/05/14 2141

## 2014-09-05 NOTE — Progress Notes (Addendum)
[facility] consulted by EDP to see patient regarding home health services and dme walker.  EDCM spoke to and his family at bedside, daughter Shauna Hugh and son in law [place].  Patient lives alone, patient's family live nearby and check in on patient.  Patient having falls at home per EDP.  Patient reports he is able to complete his ADL's on his own.  Patient has never had home health services.  Patient reports, "I think I have a few canes at home.  I gave quite a bit of equipment away."  Patient reports his pcp is Dr. Dagmar Hait of [facility].  System updated.  EDCM provided patient with a list of home health agencies in [facility], explained services.  Patient has chosen [facility] for home health RN and PT.  [facility] provided patient with walker from [facility] closet, appropriate paperwork filled out and placed on table to [facility] closet.  Patient reports he would like to return on discharge, not a facility.  EDCM assessed for further home health needs.  No further home health needs at this time.  Discussed with EDP who will place orders for home health RN and PT if patient discharged.  Discussed with EDRN.  No further EDCM needs at this time.  09/06/2014 0033am EDCM faxed home health orders for RN and PT to [facility] at 2316pm with confirmation of receipt at 2317pm

## 2014-09-05 NOTE — ED Notes (Signed)
Bed: WF09 Expected date:  Expected time:  Means of arrival:  Comments: Elderly, fall

## 2014-09-05 NOTE — ED Notes (Signed)
Per ems pt from home, co fall today, pt has fallen yesterday as well. Fall was witnessed by neighbor , no loc, no  Neck pain, left knee swelling.  no anticoagulant intake. Pt alert and oriented x 4.

## 2014-09-06 NOTE — Progress Notes (Signed)
  CARE MANAGEMENT ED NOTE 09/06/2014  Patient:  TRESHUN, WOLD   Account Number:  [id number]  Date Initiated:  09/06/2014  Documentation initiated by:  Livia Snellen  Subjective/Objective Assessment:   Patient presented to Ed on 03/09 post fall.     Subjective/Objective Assessment Detail:     Action/Plan:   Action/Plan Detail:   Anticipated DC Date:  09/05/2014     Status Recommendation to Physician:   Result of Recommendation:    Other ED Services  Consult Working [place]  CM consult  Other  Outpatient Services - Pt will follow up   [place]   Choice offered to / List presented to:  C-1 Patient  DME arranged  [place] [facility]     DME agency  [place] arranged  HH-1 RN  [place] agency  [facility]    Status of service:  Completed, signed off  ED Comments:   ED Comments Detail:  09/06/2014 A. Deajah Erkkila RNCM 1843pm ED CM called and spoke to patient's Diane for follow up. Per Laveda Norman has called patient and will be calling again tomorrow to set up RN visit for possibly Sat or Sun and PT on Monday.  Patient is using the walker provided to him by [facility]  Patient's daughter thankful for services.  No further EDCM needs at this time.

## 2015-06-10 ENCOUNTER — Emergency Department (HOSPITAL_COMMUNITY)
Admission: EM | Admit: 2015-06-10 | Discharge: 2015-06-10 | Disposition: A | Discharge status: Home / Self Care | Payer: Medicare Other | Attending: Emergency Medicine | Admitting: Emergency Medicine

## 2015-06-10 ENCOUNTER — Encounter (HOSPITAL_COMMUNITY): Payer: Self-pay

## 2015-06-10 ENCOUNTER — Emergency Department (HOSPITAL_COMMUNITY): Payer: Medicare Other

## 2015-06-10 DIAGNOSIS — Z9861 Coronary angioplasty status: Secondary | ICD-10-CM | POA: Insufficient documentation

## 2015-06-10 DIAGNOSIS — W01198A Fall on same level from slipping, tripping and stumbling with subsequent striking against other object, initial encounter: Secondary | ICD-10-CM | POA: Diagnosis not present

## 2015-06-10 DIAGNOSIS — S298XXA Other specified injuries of thorax, initial encounter: Secondary | ICD-10-CM

## 2015-06-10 DIAGNOSIS — Z79899 Other long term (current) drug therapy: Secondary | ICD-10-CM | POA: Insufficient documentation

## 2015-06-10 DIAGNOSIS — Y998 Other external cause status: Secondary | ICD-10-CM | POA: Insufficient documentation

## 2015-06-10 DIAGNOSIS — Y92009 Unspecified place in unspecified non-institutional (private) residence as the place of occurrence of the external cause: Secondary | ICD-10-CM | POA: Diagnosis not present

## 2015-06-10 DIAGNOSIS — Z87891 Personal history of nicotine dependence: Secondary | ICD-10-CM | POA: Diagnosis not present

## 2015-06-10 DIAGNOSIS — W19XXXA Unspecified fall, initial encounter: Secondary | ICD-10-CM

## 2015-06-10 DIAGNOSIS — S20212A Contusion of left front wall of thorax, initial encounter: Secondary | ICD-10-CM | POA: Diagnosis not present

## 2015-06-10 DIAGNOSIS — Y93E5 Activity, floor mopping and cleaning: Secondary | ICD-10-CM | POA: Insufficient documentation

## 2015-06-10 DIAGNOSIS — E119 Type 2 diabetes mellitus without complications: Secondary | ICD-10-CM | POA: Diagnosis not present

## 2015-06-10 DIAGNOSIS — Z7984 Long term (current) use of oral hypoglycemic drugs: Secondary | ICD-10-CM | POA: Insufficient documentation

## 2015-06-10 DIAGNOSIS — S29001A Unspecified injury of muscle and tendon of front wall of thorax, initial encounter: Secondary | ICD-10-CM | POA: Diagnosis present

## 2015-06-10 MED ORDER — NAPROXEN 375 MG PO TABS: 375.0000 mg | ORAL_TABLET | Freq: Two times a day (BID) | ORAL | Status: DC

## 2015-06-10 MED ORDER — IBUPROFEN 800 MG PO TABS
800.0000 mg | ORAL_TABLET | Freq: Once | ORAL | Status: AC
  Administered 2015-06-10: 800 mg via ORAL
  Filled 2015-06-10: qty 1

## 2015-06-10 NOTE — ED Provider Notes (Signed)
CSN: [id number]     Arrival date & time 06/10/15  1137 History  By signing my name below, I, [name] Cook, attest that this documentation has been prepared under the direction and in the presence of [name] Levering, PA-C  Electronically Signed: Erling Cook, ED Scribe. 06/10/2015. 12:50 PM..    Chief Complaint  Patient presents with  . Fall  . Rib Injury    The history is provided by the patient. No language interpreter was used.    HPI Comments: [name] Cook is a 81 y.o. male with a h/o DM and stented coronary artery who presents to the Emergency Department complaining of constant, moderate, sore, left rib pain onset 2 days ago. He states he was mopping the floor of his house and he slipped on soapy floor and fell and hit the wall on his left side. Describes the pain as very sore. He denies any head injury or LOC. He states he did not hit the floor. Pt reports the pain is exacerbated with certain movements including taking a deep breath. Pt took Tylenol at home with significant relief. He is not currently on any anticoagulant medications. He denies any h/o MI. Pt denies any chest pain, SOB, nausea, vomiting or other associated symptoms. Pt is concerned that he may have damage a rib and would like an xray.     Past Medical History  Diagnosis Date  . Stented coronary artery   . Diabetes mellitus without complication [facility])    Past Surgical History  Procedure Laterality Date  . Coronary angioplasty with stent placement     History reviewed. No pertinent family history. Social History  Substance Use Topics  . Smoking status: Former Research scientist (life sciences)  . Smokeless tobacco: None  . Alcohol Use: No    Review of Systems  All other systems reviewed and are negative.     Allergies  Review of patient's allergies indicates no known allergies.  Home Medications   Prior to Admission medications   Medication Sig Start Date End Date Taking? Authorizing Provider   HYDROcodone-acetaminophen (NORCO/VICODIN) 5-325 MG per tablet Take 1 tablet by mouth every 4 (four) hours as needed for moderate pain. 09/05/14   Jola Schmidt, MD  metoprolol (LOPRESSOR) 50 MG tablet Take 50 mg by mouth daily.    Historical Provider, MD  saxagliptin HCl (ONGLYZA) 5 MG TABS tablet Take 5 mg by mouth daily.    Historical Provider, MD   Triage Vitals: BP 165/73 mmHg  Pulse 75  Temp(Src) 97.3 F (36.3 C) (Oral)  Resp 16  SpO2 100%  Physical Exam  Constitutional: He is oriented to person, place, and time. He appears well-developed and well-nourished. No distress.  HENT:  Head: Normocephalic and atraumatic.  Mouth/Throat: Oropharynx is clear and moist. No oropharyngeal exudate.  Eyes: Conjunctivae and EOM are normal. Pupils are equal, round, and reactive to light. Right eye exhibits no discharge. Left eye exhibits no discharge. No scleral icterus.  Neck: Normal range of motion. Neck supple.  Cardiovascular: Normal rate, regular rhythm, normal heart sounds and intact distal pulses.  Exam reveals no gallop and no friction rub.   No murmur heard. Pulmonary/Chest: Effort normal and breath sounds normal. No respiratory distress. He has no wheezes. He has no rales.  Chest expansion equal bilaterally with inspiration.  Abdominal: Soft. Bowel sounds are normal. He exhibits no distension. There is no tenderness. There is no guarding.  Musculoskeletal: Normal range of motion. He exhibits no edema.  Very mild TTP over left  intercostal muscles in the mid axillary line. No obvious bony deformity. No edema or ecchymosis.  Lymphadenopathy:    He has no cervical adenopathy.  Neurological: He is alert and oriented to person, place, and time. No cranial nerve deficit.  Strength 5/5 throughout. No sensory deficits.  No gait abnormality.  Skin: Skin is warm and dry. No rash noted. He is not diaphoretic. No erythema. No pallor.  Psychiatric: He has a normal mood and affect. His behavior is  normal.  Nursing note and vitals reviewed.   ED Course  Procedures (including critical care time)  DIAGNOSTIC STUDIES: Oxygen Saturation is 100% on RA, normal by my interpretation.    COORDINATION OF CARE:   Labs Review Labs Reviewed - No data to display  Imaging Review Dg Chest 2 View  06/10/2015  CLINICAL DATA:  [place] 2 days ago and complains of left-sided chest pain. EXAM: CHEST  2 VIEW COMPARISON:  08/19/2005 FINDINGS: Both lungs are clear. Heart and mediastinum are within normal limits. Trachea is midline. Negative for a pneumothorax. Mild degenerative changes in the thoracic spine. No large pleural effusions. IMPRESSION: No active cardiopulmonary disease. Electronically Signed   By: Markus Daft M.D.   On: 06/10/2015 12:15   Dg Ribs Unilateral Left  06/10/2015  CLINICAL DATA:  Fall.  Left rib pain EXAM: LEFT RIBS - 2 VIEW COMPARISON:  06/10/2015 FINDINGS: Negative for rib fracture. Minimal left effusion. Negative for pneumothorax. IMPRESSION: Negative for left rib fracture. Electronically Signed   By: Franchot Gallo M.D.   On: 06/10/2015 13:22   I have personally reviewed and evaluated these images and lab results as part of my medical decision-making.   EKG Interpretation None      MDM   Final diagnoses:  Rib contusion, left, initial encounter    Otherwise healthy 81 year old male presents for soreness in his left chest after falling against a wall yesterday. No head injury. No loss of consciousness. No shortness of breath or chest pain. No blood thinners Chest wall expansion equal bilaterally with inspiration. X-ray reveals no acute fracture of rib, no pleural effusion, no pneumothorax. Patient appears well in ED, nontoxic. In no acute distress. Vital signs are stable. Suspect patient has bruising to the left intercostal muscles. Encourage ibuprofen as needed for pain as this is provided him relief at home. Will also given incentive spirometer to encourage patient to take  deep breaths. Patient will follow up with his primary care provider for reevaluation. Return precautions outlined in patient discharge instructions. I personally performed the services described in this documentation, which was scribed in my presence. The recorded information has been reviewed and is accurate.      Dondra Spry Byars, PA-C 06/11/15 [place], MD 06/12/15 [phone]

## 2015-06-10 NOTE — Discharge Instructions (Signed)
Contusion A contusion is a deep bruise. Contusions happen when an injury causes bleeding under the skin. Symptoms of bruising include pain, swelling, and discolored skin. The skin may turn blue, purple, or yellow. HOME CARE   Rest the injured area.  If told, put ice on the injured area.  Put ice in a plastic bag.  Place a towel between your skin and the bag.  Leave the ice on for 20 minutes, 2-3 times per day.  If told, put light pressure (compression) on the injured area using an elastic bandage. Make sure the bandage is not too tight. Remove it and put it back on as told by your doctor.  If possible, raise (elevate) the injured area above the level of your heart while you are sitting or lying down.  Take over-the-counter and prescription medicines only as told by your doctor. GET HELP IF:  Your symptoms do not get better after several days of treatment.  Your symptoms get worse.  You have trouble moving the injured area. GET HELP RIGHT AWAY IF:   You have very bad pain.  You have a loss of feeling (numbness) in a hand or foot.  Your hand or foot turns pale or cold.   This information is not intended to replace advice given to you by your health care provider. Make sure you discuss any questions you have with your health care provider.   Document Released: 12/02/2007 Document Revised: 03/06/2015 Document Reviewed: 10/31/2014 Elsevier Interactive Patient Education 2016 [place].  Rib Contusion A rib contusion is a deep bruise on your rib area. Contusions are the result of a blunt trauma that causes bleeding and injury to the tissues under the skin. A rib contusion may involve bruising of the ribs and of the skin and muscles in the area. The skin overlying the contusion may turn blue, purple, or yellow. Minor injuries will give you a painless contusion, but more severe contusions may stay painful and swollen for a few weeks. CAUSES  A contusion is usually caused by a  blow, trauma, or direct force to an area of the body. This often occurs while playing contact sports. SYMPTOMS  Swelling and redness of the injured area.  Discoloration of the injured area.  Tenderness and soreness of the injured area.  Pain with or without movement. DIAGNOSIS  The diagnosis can be made by taking a medical history and performing a physical exam. An X-ray, CT scan, or MRI may be needed to determine if there were any associated injuries, such as broken bones (fractures) or internal injuries. TREATMENT  Often, the best treatment for a rib contusion is rest. Icing or applying cold compresses to the injured area may help reduce swelling and inflammation. Deep breathing exercises may be recommended to reduce the risk of partial lung collapse and pneumonia. Over-the-counter or prescription medicines may also be recommended for pain control. HOME CARE INSTRUCTIONS   Apply ice to the injured area:  Put ice in a plastic bag.  Place a towel between your skin and the bag.  Leave the ice on for 20 minutes, 2-3 times per day.  Take medicines only as directed by your health care provider.  Rest the injured area. Avoid strenuous activity and any activities or movements that cause pain. Be careful during activities and avoid bumping the injured area.  Perform deep-breathing exercises as directed by your health care provider.  Do not lift anything that is heavier than 5 lb (2.3 kg) until your health care  provider approves.  Do not use any tobacco products, including cigarettes, chewing tobacco, or electronic cigarettes. If you need help quitting, ask your health care provider. SEEK MEDICAL CARE IF:   You have increased bruising or swelling.  You have pain that is not controlled with treatment.  You have a fever. SEEK IMMEDIATE MEDICAL CARE IF:   You have difficulty breathing or shortness of breath.  You develop a continual cough, or you cough up thick or bloody  sputum.  You feel sick to your stomach (nauseous), you throw up (vomit), or you have abdominal pain.   This information is not intended to replace advice given to you by your health care provider. Make sure you discuss any questions you have with your health care provider.  Follow-up with her primary care provider for reevaluation. Use incentive spirometry as instructed. Return to the emergency department if you experience severe increase in your pain, difficulty breathing, chest pain.

## 2015-06-10 NOTE — ED Notes (Signed)
Pt was mopping floor day before yesterday.  Fell against wall.  No head injury.  Did not hit floor. Pt c/o left rib cage.

## 2015-08-29 ENCOUNTER — Encounter (HOSPITAL_COMMUNITY): Payer: Self-pay | Admitting: Emergency Medicine

## 2015-08-29 ENCOUNTER — Emergency Department (HOSPITAL_COMMUNITY)
Admission: EM | Admit: 2015-08-29 | Discharge: 2015-08-30 | Disposition: A | Discharge status: Home / Self Care | Payer: Medicare Other | Attending: Emergency Medicine | Admitting: Emergency Medicine

## 2015-08-29 DIAGNOSIS — Z972 Presence of dental prosthetic device (complete) (partial): Secondary | ICD-10-CM | POA: Diagnosis not present

## 2015-08-29 DIAGNOSIS — Z0389 Encounter for observation for other suspected diseases and conditions ruled out: Secondary | ICD-10-CM | POA: Insufficient documentation

## 2015-08-29 DIAGNOSIS — E119 Type 2 diabetes mellitus without complications: Secondary | ICD-10-CM | POA: Diagnosis not present

## 2015-08-29 DIAGNOSIS — Z79899 Other long term (current) drug therapy: Secondary | ICD-10-CM | POA: Diagnosis not present

## 2015-08-29 DIAGNOSIS — T189XXA Foreign body of alimentary tract, part unspecified, initial encounter: Secondary | ICD-10-CM

## 2015-08-29 DIAGNOSIS — Z87891 Personal history of nicotine dependence: Secondary | ICD-10-CM | POA: Insufficient documentation

## 2015-08-29 DIAGNOSIS — Z9861 Coronary angioplasty status: Secondary | ICD-10-CM | POA: Insufficient documentation

## 2015-08-29 NOTE — ED Notes (Signed)
Patient states he believe he swallowed his body dentures because he can't find them. Patient denies pain, difficulty swallowing, SOB, N/V.

## 2015-08-30 ENCOUNTER — Emergency Department (HOSPITAL_COMMUNITY): Payer: Medicare Other

## 2015-08-30 DIAGNOSIS — Z0389 Encounter for observation for other suspected diseases and conditions ruled out: Secondary | ICD-10-CM | POA: Diagnosis not present

## 2015-08-30 NOTE — Discharge Instructions (Signed)
Your x-rays showed no evidence of any swallowed dentures. Your physical exam was reassuring. Follow-up with your doctor for reevaluation as needed.

## 2015-08-30 NOTE — ED Provider Notes (Signed)
CSN: [id number]     Arrival date & time 08/29/15  2313 History   First MD Initiated Contact with Patient 08/30/15 0105     Chief Complaint  Patient presents with  . Foreign Body     (Consider location/radiation/quality/duration/timing/severity/associated sxs/prior Treatment) HPI [name] Cook is a 82 y.o. male who comes in for evaluation of an suspected foreign body. Patient reports earlier last evening, he possibly swallowed his lower dentures. Patient reports he had his dentures on the end table earlier this evening, but then they were lost and he could not find them. He is concerned that he may have swallowed them. He does not remember swallowing dentures, denies any cough, difficulty swallowing, voice changes, chest pain, shortness of breath or any other medical symptoms. He does report that he lives with 4 cats and they will frequently play with his dentures and he thinks, possibly, they may have knocked his dentures under the furniture. No other alleviating or modifying factors.  Past Medical History  Diagnosis Date  . Stented coronary artery   . Diabetes mellitus without complication [facility])    Past Surgical History  Procedure Laterality Date  . Coronary angioplasty with stent placement     No family history on file. Social History  Substance Use Topics  . Smoking status: Former Research scientist (life sciences)  . Smokeless tobacco: None  . Alcohol Use: No    Review of Systems A 10 point review of systems was completed and was negative except for pertinent positives and negatives as mentioned in the history of present illness     Allergies  Review of patient's allergies indicates no known allergies.  Home Medications   Prior to Admission medications   Medication Sig Start Date End Date Taking? Authorizing Provider  metoprolol (LOPRESSOR) 50 MG tablet Take 50 mg by mouth daily.   Yes Historical Provider, MD  HYDROcodone-acetaminophen (NORCO/VICODIN) 5-325 MG per tablet Take 1 tablet by mouth every  4 (four) hours as needed for moderate pain. Patient not taking: Reported on 08/30/2015 09/05/14   Jola Schmidt, MD  naproxen (NAPROSYN) 375 MG tablet Take 1 tablet (375 mg total) by mouth 2 (two) times daily. Patient not taking: Reported on 08/30/2015 06/10/15   Samantha Tripp Dowless, PA-C   BP 178/85 mmHg  Pulse 90  Temp(Src) 98.5 F (36.9 C) (Oral)  Resp 20  SpO2 97% Physical Exam  Constitutional: He appears well-developed and well-nourished. No distress.  Awake, alert, nontoxic appearance.  HENT:  Head: Atraumatic.  Eyes: Right eye exhibits no discharge. Left eye exhibits no discharge.  Neck: Normal range of motion. Neck supple.  Cardiovascular: Normal rate, regular rhythm and normal heart sounds.   Pulmonary/Chest: Effort normal and breath sounds normal. No respiratory distress. He has no wheezes. He has no rales. He exhibits no tenderness.  Abdominal: Soft. There is no tenderness. There is no rebound.  Musculoskeletal: He exhibits no tenderness.  Baseline ROM, no obvious new focal weakness.  Neurological:  Mental status and motor strength appears baseline for patient and situation.  Skin: No rash noted. He is not diaphoretic.  Psychiatric: He has a normal mood and affect.  Nursing note and vitals reviewed.   ED Course  Procedures (including critical care time) Labs Review Labs Reviewed - No data to display  Imaging Review Dg Chest 1 View  08/30/2015  CLINICAL DATA:  82 year old male with concern for swelling dentures EXAM: ABDOMEN - 1 VIEW; CHEST  1 VIEW COMPARISON:  Chest radiograph dated 06/10/2015 FINDINGS: No radiopaque  foreign object identified in the chest abdomen or pelvis. Single-view of the chest demonstrates emphysematous changes of the lungs. There is no focal consolidation. No pleural effusion or pneumothorax. The cardiac silhouette is within normal limits. There is degenerative changes of the spine. No acute fracture. Copious amount of stool noted throughout the  colon. No evidence of bowel obstruction or free air. No radiopaque calculi. Right upper quadrant cholecystectomy clips. There is degenerative changes of the spine. IMPRESSION: No radiopaque foreign object. Electronically Signed   By: Anner Crete M.D.   On: 08/30/2015 01:20   Dg Abd 1 View  08/30/2015  CLINICAL DATA:  82 year old male with concern for swelling dentures EXAM: ABDOMEN - 1 VIEW; CHEST  1 VIEW COMPARISON:  Chest radiograph dated 06/10/2015 FINDINGS: No radiopaque foreign object identified in the chest abdomen or pelvis. Single-view of the chest demonstrates emphysematous changes of the lungs. There is no focal consolidation. No pleural effusion or pneumothorax. The cardiac silhouette is within normal limits. There is degenerative changes of the spine. No acute fracture. Copious amount of stool noted throughout the colon. No evidence of bowel obstruction or free air. No radiopaque calculi. Right upper quadrant cholecystectomy clips. There is degenerative changes of the spine. IMPRESSION: No radiopaque foreign object. Electronically Signed   By: Anner Crete M.D.   On: 08/30/2015 01:20   I have personally reviewed and evaluated these images and lab results as part of my medical decision-making.   EKG Interpretation None     Meds given in ED:  Medications - No data to display  Discharge Medication List as of 08/30/2015  1:31 AM     Filed Vitals:   08/29/15 2335  BP: 178/85  Pulse: 90  Temp: 98.5 F (36.9 C)  TempSrc: Oral  Resp: 20  SpO2: 97%    MDM  [name] Cook is a 82 y.o. male who comes in concerned that he swallowed his lower dentures. Patient confirms lower dentures is full set of teeth. I doubt patient swallowed a full set of teeth. Requesting x-rays. X-rays of chest and abdomen are negative. Denies any cardiopulmonary complaints, difficulty swallowing, chest pain, shortness of breath. Possibility exists that his cats knocked his dentures under furniture. Patient  overall appears very well, nontoxic, hemodynamically stable. Prior to patient discharge, I discussed and reviewed this case with Dr.Ward who also saw and evaluated the patient and agrees with plan. Final diagnoses:  Wears dentures        Comer Locket, PA-C 08/30/15 [place], DO 08/30/15 [id number]

## 2016-06-04 ENCOUNTER — Encounter: Payer: Self-pay | Admitting: *Deleted

## 2016-06-08 NOTE — H&P (Signed)
See scanned note.

## 2016-06-10 ENCOUNTER — Encounter
Admission: RE | Disposition: A | Discharge status: Home / Self Care | Payer: Self-pay | Source: Ambulatory Visit | Attending: Ophthalmology

## 2016-06-10 ENCOUNTER — Encounter: Payer: Self-pay | Admitting: *Deleted

## 2016-06-10 ENCOUNTER — Ambulatory Visit
Admission: RE | Admit: 2016-06-10 | Discharge: 2016-06-10 | Disposition: A | Discharge status: Home / Self Care | Payer: Medicare Other | Source: Ambulatory Visit | Attending: Ophthalmology | Admitting: Ophthalmology

## 2016-06-10 ENCOUNTER — Ambulatory Visit: Payer: Medicare Other | Admitting: Certified Registered Nurse Anesthetist

## 2016-06-10 DIAGNOSIS — I2510 Atherosclerotic heart disease of native coronary artery without angina pectoris: Secondary | ICD-10-CM | POA: Diagnosis not present

## 2016-06-10 DIAGNOSIS — Z955 Presence of coronary angioplasty implant and graft: Secondary | ICD-10-CM | POA: Insufficient documentation

## 2016-06-10 DIAGNOSIS — Z87891 Personal history of nicotine dependence: Secondary | ICD-10-CM | POA: Insufficient documentation

## 2016-06-10 DIAGNOSIS — I10 Essential (primary) hypertension: Secondary | ICD-10-CM | POA: Insufficient documentation

## 2016-06-10 DIAGNOSIS — E1136 Type 2 diabetes mellitus with diabetic cataract: Secondary | ICD-10-CM | POA: Diagnosis not present

## 2016-06-10 DIAGNOSIS — Z85828 Personal history of other malignant neoplasm of skin: Secondary | ICD-10-CM | POA: Insufficient documentation

## 2016-06-10 HISTORY — DX: Unspecified hearing loss, unspecified ear: H91.90

## 2016-06-10 HISTORY — DX: Unspecified dementia, unspecified severity, without behavioral disturbance, psychotic disturbance, mood disturbance, and anxiety: F03.90

## 2016-06-10 HISTORY — DX: Essential (primary) hypertension: I10

## 2016-06-10 HISTORY — DX: Atherosclerotic heart disease of native coronary artery without angina pectoris: I25.10

## 2016-06-10 HISTORY — PX: CATARACT EXTRACTION W/PHACO: SHX586

## 2016-06-10 LAB — GLUCOSE, CAPILLARY: GLUCOSE-CAPILLARY: 148 mg/dL — AB (ref 65–99)

## 2016-06-10 SURGERY — PHACOEMULSIFICATION, CATARACT, WITH IOL INSERTION
Anesthesia: General | Site: Eye | Laterality: Left | Wound class: Clean

## 2016-06-10 MED ORDER — METOPROLOL TARTRATE 50 MG PO TABS
50.0000 mg | ORAL_TABLET | Freq: Once | ORAL | Status: AC
Start: 2016-06-10 — End: 2016-06-10
  Administered 2016-06-10: 50 mg via ORAL

## 2016-06-10 MED ORDER — PHENYLEPHRINE HCL 10 % OP SOLN
OPHTHALMIC | Status: AC
  Administered 2016-06-10: 1 [drp] via OPHTHALMIC
  Filled 2016-06-10: qty 5

## 2016-06-10 MED ORDER — ALFENTANIL 500 MCG/ML IJ INJ
INJECTION | INTRAMUSCULAR | Status: DC | PRN
  Administered 2016-06-10: 200 ug via INTRAVENOUS
  Administered 2016-06-10: 300 ug via INTRAVENOUS

## 2016-06-10 MED ORDER — NA CHONDROIT SULF-NA HYALURON 40-17 MG/ML IO SOLN
INTRAOCULAR | Status: DC | PRN
  Administered 2016-06-10: 1 mL via INTRAOCULAR

## 2016-06-10 MED ORDER — CYCLOPENTOLATE HCL 2 % OP SOLN
OPHTHALMIC | Status: AC
  Administered 2016-06-10: 1 [drp] via OPHTHALMIC
  Filled 2016-06-10: qty 2

## 2016-06-10 MED ORDER — CEFUROXIME OPHTHALMIC INJECTION 1 MG/0.1 ML
INJECTION | OPHTHALMIC | Status: DC | PRN
  Administered 2016-06-10: 1 mg via INTRACAMERAL

## 2016-06-10 MED ORDER — MOXIFLOXACIN HCL 0.5 % OP SOLN
OPHTHALMIC | Status: AC
Start: 2016-06-10 — End: 2016-06-10
  Administered 2016-06-10: 1 [drp] via OPHTHALMIC
  Filled 2016-06-10: qty 3

## 2016-06-10 MED ORDER — METOPROLOL TARTRATE 50 MG PO TABS
ORAL_TABLET | ORAL | Status: AC
  Administered 2016-06-10: 50 mg via ORAL
  Filled 2016-06-10: qty 1

## 2016-06-10 MED ORDER — MOXIFLOXACIN HCL 0.5 % OP SOLN
OPHTHALMIC | Status: DC | PRN
  Administered 2016-06-10: 1 [drp] via OPHTHALMIC

## 2016-06-10 MED ORDER — POVIDONE-IODINE 5 % OP SOLN
OPHTHALMIC | Status: DC | PRN
  Administered 2016-06-10: 1 via OPHTHALMIC

## 2016-06-10 MED ORDER — EPINEPHRINE PF 1 MG/ML IJ SOLN
INTRAOCULAR | Status: DC | PRN
  Administered 2016-06-10: 200 mL via OPHTHALMIC

## 2016-06-10 MED ORDER — CEFUROXIME OPHTHALMIC INJECTION 1 MG/0.1 ML
INJECTION | OPHTHALMIC | Status: AC
  Filled 2016-06-10: qty 0.1

## 2016-06-10 MED ORDER — LIDOCAINE HCL (PF) 4 % IJ SOLN
INTRAMUSCULAR | Status: DC | PRN
  Administered 2016-06-10: 5 mL via OPHTHALMIC

## 2016-06-10 MED ORDER — NA CHONDROIT SULF-NA HYALURON 40-17 MG/ML IO SOLN
INTRAOCULAR | Status: AC
  Filled 2016-06-10: qty 1

## 2016-06-10 MED ORDER — HYALURONIDASE HUMAN 150 UNIT/ML IJ SOLN
INTRAMUSCULAR | Status: AC
  Filled 2016-06-10: qty 1

## 2016-06-10 MED ORDER — BUPIVACAINE HCL (PF) 0.75 % IJ SOLN
INTRAMUSCULAR | Status: AC
  Filled 2016-06-10: qty 10

## 2016-06-10 MED ORDER — TETRACAINE HCL 0.5 % OP SOLN
OPHTHALMIC | Status: DC | PRN
  Administered 2016-06-10: 2 [drp] via OPHTHALMIC

## 2016-06-10 MED ORDER — TETRACAINE HCL 0.5 % OP SOLN
OPHTHALMIC | Status: AC
  Filled 2016-06-10: qty 2

## 2016-06-10 MED ORDER — SODIUM CHLORIDE 0.9 % IV SOLN
INTRAVENOUS | Status: DC
  Administered 2016-06-10: 50 mL/h via INTRAVENOUS

## 2016-06-10 MED ORDER — EPINEPHRINE PF 1 MG/ML IJ SOLN
INTRAMUSCULAR | Status: AC
  Filled 2016-06-10: qty 1

## 2016-06-10 MED ORDER — CYCLOPENTOLATE HCL 2 % OP SOLN
1.0000 [drp] | OPHTHALMIC | Status: AC
  Administered 2016-06-10 (×4): 1 [drp] via OPHTHALMIC

## 2016-06-10 MED ORDER — LIDOCAINE HCL (PF) 4 % IJ SOLN
INTRAOCULAR | Status: DC | PRN
  Administered 2016-06-10: 4 mL via OPHTHALMIC

## 2016-06-10 MED ORDER — CARBACHOL 0.01 % IO SOLN
INTRAOCULAR | Status: DC | PRN
  Administered 2016-06-10: 0.5 mL via INTRAOCULAR

## 2016-06-10 MED ORDER — LIDOCAINE HCL (PF) 4 % IJ SOLN
INTRAMUSCULAR | Status: AC
  Filled 2016-06-10: qty 5

## 2016-06-10 MED ORDER — PHENYLEPHRINE HCL 10 % OP SOLN
1.0000 [drp] | OPHTHALMIC | Status: AC
  Administered 2016-06-10 (×4): 1 [drp] via OPHTHALMIC

## 2016-06-10 MED ORDER — POVIDONE-IODINE 5 % OP SOLN
OPHTHALMIC | Status: AC
  Filled 2016-06-10: qty 30

## 2016-06-10 MED ORDER — MOXIFLOXACIN HCL 0.5 % OP SOLN
1.0000 [drp] | OPHTHALMIC | Status: AC
  Administered 2016-06-10 (×3): 1 [drp] via OPHTHALMIC

## 2016-06-10 SURGICAL SUPPLY — 30 items
CANNULA ANT/CHMB 27G (MISCELLANEOUS) ×1 IMPLANT
CANNULA ANT/CHMB 27GA (MISCELLANEOUS) ×3 IMPLANT
CORD BIP STRL DISP 12FT (MISCELLANEOUS) ×3 IMPLANT
CUP MEDICINE 2OZ PLAST GRAD ST (MISCELLANEOUS) ×3 IMPLANT
DRAPE XRAY CASSETTE 23X24 (DRAPES) ×3 IMPLANT
ERASER HMR WETFIELD 18G (MISCELLANEOUS) ×3 IMPLANT
GLOVE BIO SURGEON STRL SZ8 (GLOVE) ×3 IMPLANT
GLOVE SURG LX 6.5 MICRO (GLOVE) ×2
GLOVE SURG LX 8.0 MICRO (GLOVE) ×2
GLOVE SURG LX STRL 6.5 MICRO (GLOVE) ×1 IMPLANT
GLOVE SURG LX STRL 8.0 MICRO (GLOVE) ×1 IMPLANT
GOWN STRL REUS W/ TWL LRG LVL3 (GOWN DISPOSABLE) ×1 IMPLANT
GOWN STRL REUS W/ TWL XL LVL3 (GOWN DISPOSABLE) ×1 IMPLANT
GOWN STRL REUS W/TWL LRG LVL3 (GOWN DISPOSABLE) ×3
GOWN STRL REUS W/TWL XL LVL3 (GOWN DISPOSABLE) ×3
LENS IOL ACRYSOF IQ 20.0 (Intraocular Lens) ×2 IMPLANT
PACK CATARACT (MISCELLANEOUS) ×3 IMPLANT
PACK CATARACT DINGLEDEIN LX (MISCELLANEOUS) ×3 IMPLANT
PACK EYE AFTER SURG (MISCELLANEOUS) ×3 IMPLANT
SHLD EYE VISITEC  UNIV (MISCELLANEOUS) ×3 IMPLANT
SOL BSS BAG (MISCELLANEOUS) ×3
SOL PREP PVP 2OZ (MISCELLANEOUS) ×3
SOLUTION BSS BAG (MISCELLANEOUS) ×1 IMPLANT
SOLUTION PREP PVP 2OZ (MISCELLANEOUS) ×1 IMPLANT
SUT SILK 5-0 (SUTURE) ×3 IMPLANT
SYR 3ML LL SCALE MARK (SYRINGE) ×3 IMPLANT
SYR 5ML LL (SYRINGE) ×3 IMPLANT
SYR TB 1ML 27GX1/2 LL (SYRINGE) ×3 IMPLANT
WATER STERILE IRR 250ML POUR (IV SOLUTION) ×3 IMPLANT
WIPE NON LINTING 3.25X3.25 (MISCELLANEOUS) ×3 IMPLANT

## 2016-06-10 NOTE — Anesthesia Postprocedure Evaluation (Signed)
Anesthesia Post Note  Patient: [name] Cook  Procedure(s) Performed: Procedure(s) (LRB): CATARACT EXTRACTION PHACO AND INTRAOCULAR LENS PLACEMENT (IOC) (Left)  Patient location during evaluation: PACU Anesthesia Type: MAC Level of consciousness: awake and alert and oriented Pain management: satisfactory to patient Vital Signs Assessment: post-procedure vital signs reviewed and stable Respiratory status: respiratory function stable Cardiovascular status: stable Anesthetic complications: no    Last Vitals:  Vitals:   06/10/16 0649  BP: (!) 157/68  Pulse: 88  Resp: 17  Temp: 36.5 C    Last Pain:  Vitals:   06/10/16 0649  TempSrc: Tympanic                 Blima Singer

## 2016-06-10 NOTE — Anesthesia Preprocedure Evaluation (Signed)
Anesthesia Evaluation  Patient identified by MRN, date of birth, ID band Patient awake    Reviewed: Allergy & Precautions, H&P , NPO status , Patient's Chart, lab work & pertinent test results, reviewed documented beta blocker date and time   Airway Mallampati: II   Neck ROM: full    Dental  (+) Poor Dentition, Teeth Intact   Pulmonary neg pulmonary ROS, former smoker,    Pulmonary exam normal        Cardiovascular hypertension, + CAD  negative cardio ROS Normal cardiovascular exam Rhythm:regular Rate:Normal     Neuro/Psych negative neurological ROS  negative psych ROS   GI/Hepatic negative GI ROS, Neg liver ROS,   Endo/Other  negative endocrine ROSdiabetes  Renal/GU negative Renal ROS  negative genitourinary   Musculoskeletal   Abdominal   Peds  Hematology negative hematology ROS (+)   Anesthesia Other Findings Past Medical History: 2002: Cancer ([place])     Comment: skin, removed from face No date: Coronary artery disease No date: Dementia     Comment: mild No date: Diabetes mellitus without complication ([place]) No date: HOH (hard of hearing)     Comment: does not wear hearing aides, but can hear you               if you speak loudly No date: Hypertension 2013: Stented coronary artery     Comment: was done as a precautionary approach due to               chest pain Past Surgical History: No date: APPENDECTOMY No date: CHOLECYSTECTOMY No date: CORONARY ANGIOPLASTY WITH STENT PLACEMENT No date: HERNIA REPAIR     Comment: bilateral inguinal hernia BMI    Body Mass Index:  22.43 kg/m     Reproductive/Obstetrics negative OB ROS                             Anesthesia Physical Anesthesia Plan  ASA: III  Anesthesia Plan: General   Post-op Pain Management:    Induction:   Airway Management Planned:   Additional Equipment:   Intra-op Plan:   Post-operative Plan:    Informed Consent: I have reviewed the patients History and Physical, chart, labs and discussed the procedure including the risks, benefits and alternatives for the proposed anesthesia with the patient or authorized representative who has indicated his/her understanding and acceptance.   Dental Advisory Given  Plan Discussed with: CRNA  Anesthesia Plan Comments:         Anesthesia Quick Evaluation

## 2016-06-10 NOTE — Op Note (Signed)
Date of Surgery: 06/10/2016 Date of Dictation: 06/10/2016 8:17 AM Pre-operative Diagnosis:  Nuclear Sclerotic Cataract left Eye Post-operative Diagnosis: same Procedure performed: Extra-capsular Cataract Extraction (ECCE) with placement of a posterior chamber intraocular lens (IOL) left Eye IOL:  Implant Name Type Inv. Item Serial No. Manufacturer Lot No. LRB No. Used  LENS IOL ACRYSOF IQ 20.0 - [id number] Intraocular Lens LENS IOL ACRYSOF IQ 20.0 [id number] ALCON   Left 1   Anesthesia: 2% Lidocaine and 4% Marcaine in a 50/50 mixture with 10 unites/ml of Hylenex given as a peribulbar Anesthesiologist: Anesthesiologist: Molli Barrows, MD CRNA: Demetrius Charity, CRNA Complications: none Estimated Blood Loss: less than 1 ml  Description of procedure:  The patient was given anesthesia and sedation via intravenous access. The patient was then prepped and draped in the usual fashion. A 25-gauge needle was bent for initiating the capsulorhexis. A 5-0 silk suture was placed through the conjunctiva superior and inferiorly to serve as bridle sutures. Hemostasis was obtained at the superior limbus using an eraser cautery. A partial thickness groove was made at the anterior surgical limbus with a 64 Beaver blade and this was dissected anteriorly with an Avaya. The anterior chamber was entered at 10 o'clock with a 1.0 mm paracentesis knife and through the lamellar dissection with a 2.6 mm Alcon keratome. Epi-Shugarcaine 0.5 CC [9 cc BSS Plus (Alcon), 3 cc 4% preservative-free lidocaine (Hospira) and 4 cc 1:1000 preservative-free, bisulfite-free epinephrine] was injected into the anterior chamber via the paracentesis tract. Epi-Shugarcaine 0.5 CC [9 cc BSS Plus (Alcon), 3 cc 4% preservative-free lidocaine (Hospira) and 4 cc 1:1000 preservative-free, bisulfite-free epinephrine] was injected into the anterior chamber via the paracentesis tract. DiscoVisc was injected to replace the aqueous and a  continuous tear curvilinear capsulorhexis was performed using a bent 25-gauge needle.  Balance salt on a syringe was used to perform hydro-dissection and phacoemulsification was carried out using a divide and conquer technique. Procedure(s) with comments: CATARACT EXTRACTION PHACO AND INTRAOCULAR LENS PLACEMENT (IOC) (Left) - Lot # [id number] H [place]: 01:04.0 AP% 25.5 CDE: 29.46. Irrigation/aspiration was used to remove the residual cortex and the capsular bag was inflated with DiscoVisc. The intraocular lens was inserted into the capsular bag using a pre-loaded UltraSert Delivery System. Irrigation/aspiration was used to remove the residual DiscoVisc. The wound was inflated with balanced salt and checked for leaks. None were found. Miostat was injected via the paracentesis track and 0.1 ml of cefuroxime containing 1 mg of drug  was injected via the paracentesis track. The wound was checked for leaks again and none were found.   The bridal sutures were removed and two drops of Vigamox were placed on the eye. An eye shield was placed to protect the eye and the patient was discharged to the recovery area in good condition.   Keldric Poyer MD

## 2016-06-10 NOTE — Discharge Instructions (Signed)
Eye Surgery Discharge Instructions  Expect mild scratchy sensation or mild soreness. DO NOT RUB YOUR EYE!  The day of surgery:  Minimal physical activity, but bed rest is not required  No reading, computer work, or close hand work  No bending, lifting, or straining.  May watch TV  For 24 hours:  No driving, legal decisions, or alcoholic beverages  Safety precautions  Eat anything you prefer: It is better to start with liquids, then soup then solid foods.  _____ Eye patch should be worn until postoperative exam tomorrow.  ____ Solar shield eyeglasses should be worn for comfort in the sunlight/patch while sleeping  Resume all regular medications including aspirin or Coumadin if these were discontinued prior to surgery. You may shower, bathe, shave, or wash your hair. Tylenol may be taken for mild discomfort.  Call your doctor if you experience significant pain, nausea, or vomiting, fever > 101 or other signs of infection. [phone] or [phone] Specific instructions:  Follow-up Information    Jailan Trimm, MD Follow up.   Specialty:  Ophthalmology Why:  December 14 at 10:10am Contact information: [address]   [place] [address] [phone]

## 2016-06-10 NOTE — Interval H&P Note (Signed)
History and Physical Interval Note:  06/10/2016 7:24 AM  [name] Cook  has presented today for surgery, with the diagnosis of cataract  The various methods of treatment have been discussed with the patient and family. After consideration of risks, benefits and other options for treatment, the patient has consented to  Procedure(s): CATARACT EXTRACTION PHACO AND INTRAOCULAR LENS PLACEMENT ([place]) (Left) as a surgical intervention .  The patient's history has been reviewed, patient examined, no change in status, stable for surgery.  I have reviewed the patient's chart and labs.  Questions were answered to the patient's satisfaction.     [name]

## 2016-06-10 NOTE — Transfer of Care (Signed)
Immediate Anesthesia Transfer of Care Note  Patient: [name] Cook  Procedure(s) Performed: Procedure(s) with comments: CATARACT EXTRACTION PHACO AND INTRAOCULAR LENS PLACEMENT (IOC) (Left) - Lot # [id number] H [place]: 01:04.0 AP% 25.5 CDE: 29.46  Patient Location: PACU  Anesthesia Type:MAC  Level of Consciousness: awake, alert  and oriented  Airway & Oxygen Therapy: Patient Spontanous Breathing  Post-op Assessment: Report given to RN and Post -op Vital signs reviewed and stable  Post vital signs: Reviewed and stable  Last Vitals:  Vitals:   06/10/16 0649  BP: (!) 157/68  Pulse: 88  Resp: 17  Temp: 36.5 C    Last Pain:  Vitals:   06/10/16 0649  TempSrc: Tympanic         Complications: No apparent anesthesia complications

## 2016-06-10 NOTE — Anesthesia Procedure Notes (Signed)
Procedure Name: MAC Performed by: Lani Havlik Pre-anesthesia Checklist: Patient identified, Emergency Drugs available, Patient being monitored, Suction available and Timeout performed Oxygen Delivery Method: Nasal cannula       

## 2016-12-12 ENCOUNTER — Encounter (HOSPITAL_COMMUNITY): Payer: Self-pay | Admitting: Emergency Medicine

## 2016-12-12 ENCOUNTER — Observation Stay (HOSPITAL_COMMUNITY)
Admission: EM | Admit: 2016-12-12 | Discharge: 2016-12-13 | Disposition: A | Discharge status: Home / Self Care | Payer: Medicare Other | Attending: Family Medicine | Admitting: Family Medicine

## 2016-12-12 ENCOUNTER — Emergency Department (HOSPITAL_COMMUNITY): Payer: Medicare Other

## 2016-12-12 DIAGNOSIS — I2510 Atherosclerotic heart disease of native coronary artery without angina pectoris: Secondary | ICD-10-CM | POA: Diagnosis not present

## 2016-12-12 DIAGNOSIS — E119 Type 2 diabetes mellitus without complications: Secondary | ICD-10-CM | POA: Diagnosis present

## 2016-12-12 DIAGNOSIS — R079 Chest pain, unspecified: Principal | ICD-10-CM | POA: Diagnosis present

## 2016-12-12 DIAGNOSIS — R945 Abnormal results of liver function studies: Secondary | ICD-10-CM

## 2016-12-12 DIAGNOSIS — Z79899 Other long term (current) drug therapy: Secondary | ICD-10-CM | POA: Insufficient documentation

## 2016-12-12 DIAGNOSIS — R9431 Abnormal electrocardiogram [ECG] [EKG]: Secondary | ICD-10-CM

## 2016-12-12 DIAGNOSIS — Z87891 Personal history of nicotine dependence: Secondary | ICD-10-CM | POA: Diagnosis not present

## 2016-12-12 DIAGNOSIS — F0390 Unspecified dementia without behavioral disturbance: Secondary | ICD-10-CM | POA: Insufficient documentation

## 2016-12-12 DIAGNOSIS — Z955 Presence of coronary angioplasty implant and graft: Secondary | ICD-10-CM | POA: Insufficient documentation

## 2016-12-12 DIAGNOSIS — R7989 Other specified abnormal findings of blood chemistry: Secondary | ICD-10-CM

## 2016-12-12 DIAGNOSIS — I10 Essential (primary) hypertension: Secondary | ICD-10-CM | POA: Diagnosis present

## 2016-12-12 LAB — CBC
HEMATOCRIT: 41.6 % (ref 39.0–52.0)
HEMOGLOBIN: 14.1 g/dL (ref 13.0–17.0)
MCH: 31.1 pg (ref 26.0–34.0)
MCHC: 33.9 g/dL (ref 30.0–36.0)
MCV: 91.6 fL (ref 78.0–100.0)
Platelets: 338 10*3/uL (ref 150–400)
RBC: 4.54 MIL/uL (ref 4.22–5.81)
RDW: 13.1 % (ref 11.5–15.5)
WBC: 10.6 10*3/uL — ABNORMAL HIGH (ref 4.0–10.5)

## 2016-12-12 LAB — I-STAT TROPONIN, ED: Troponin i, poc: 0.01 ng/mL (ref 0.00–0.08)

## 2016-12-12 LAB — BASIC METABOLIC PANEL
ANION GAP: 10 (ref 5–15)
BUN: 22 mg/dL — ABNORMAL HIGH (ref 6–20)
CHLORIDE: 96 mmol/L — AB (ref 101–111)
CO2: 24 mmol/L (ref 22–32)
Calcium: 8.9 mg/dL (ref 8.9–10.3)
Creatinine, Ser: 1.04 mg/dL (ref 0.61–1.24)
GFR calc non Af Amer: >60 mL/min (ref >60)
Glucose, Bld: 171 mg/dL — ABNORMAL HIGH (ref 65–99)
POTASSIUM: 4.2 mmol/L (ref 3.5–5.1)
Sodium: 130 mmol/L — ABNORMAL LOW (ref 135–145)

## 2016-12-12 MED ORDER — SODIUM CHLORIDE 0.9 % IV BOLUS (SEPSIS)
500.0000 mL | Freq: Once | INTRAVENOUS | Status: AC
  Administered 2016-12-13: 500 mL via INTRAVENOUS

## 2016-12-12 MED ORDER — GI COCKTAIL ~~LOC~~
30.0000 mL | Freq: Once | ORAL | Status: AC
  Administered 2016-12-12: 30 mL via ORAL
  Filled 2016-12-12: qty 30

## 2016-12-12 MED ORDER — ONDANSETRON HCL 4 MG/2ML IJ SOLN
4.0000 mg | Freq: Once | INTRAMUSCULAR | Status: AC
  Administered 2016-12-13: 4 mg via INTRAVENOUS
  Filled 2016-12-12: qty 2

## 2016-12-12 NOTE — ED Triage Notes (Signed)
Pt presents with family for midsternal CP with no radiation with N/V x 3 days; pt reports hx of stent placement; pt denies SOB, diaphoresis, lightheadedness

## 2016-12-12 NOTE — ED Provider Notes (Signed)
[facility] DEPT Provider Note   CSN: [id number] Arrival date & time: 12/12/16  2110  By signing my name below, I, Marcello Moores, attest that this documentation has been prepared under the direction and in the presence of Jamieson Hetland, Annie Main, MD. Electronically Signed: Marcello Moores, ED Scribe. 12/12/16. 11:40 PM.  History   Chief Complaint Chief Complaint  Patient presents with  . Chest Pain  . Emesis   The history is provided by the patient and a relative. No language interpreter was used.    HPI Comments: [name] is a 83 y.o. male, HTN, who presents to the Emergency Department complaining of currently mild non- radiating constant "achey" central chest pain with severe episodes of emesis that began 3 days ago. He indicates that he has not had similar problems before and has not tried any methods to alleviate his pain. Per daughter, the pt lives alone. The pt has a h/x of placement of coronary angioplasty with stent. He has a PMHx of DM where he has refused medication and denies a PMHx of PUD and GERD. He also has a h/x of appendicitis and cholecystectomy. Pt denies recent falls. He also denies diarrhea, change in appetite, back pain, cough, SOB, nausea, fever, testicular pain, and dysuria.   Past Medical History:  Diagnosis Date  . Cancer ([place]) 2002   skin, removed from face  . Coronary artery disease   . Dementia    mild  . Diabetes mellitus without complication ([place])   . HOH (hard of hearing)    does not wear hearing aides, but can hear you if you speak loudly  . Hypertension   . Stented coronary artery 2013   was done as a precautionary approach due to chest pain    There are no active problems to display for this patient.   Past Surgical History:  Procedure Laterality Date  . APPENDECTOMY    . CATARACT EXTRACTION W/PHACO Left 06/10/2016   Procedure: CATARACT EXTRACTION PHACO AND INTRAOCULAR LENS PLACEMENT (IOC);  Surgeon: Estill Cotta, MD;  Location:  [facility] ORS;  Service: Ophthalmology;  Laterality: Left;  Lot # [id number] H [place]: 01:04.0 AP% 25.5 CDE: 29.46  . CHOLECYSTECTOMY    . CORONARY ANGIOPLASTY WITH STENT PLACEMENT    . HERNIA REPAIR     bilateral inguinal hernia       Home Medications    Prior to Admission medications   Medication Sig Start Date End Date Taking? Authorizing Provider  glimepiride (AMARYL) 1 MG tablet Take 1 mg by mouth daily with breakfast. As needed if glucose more than 120    [provider]  HYDROcodone-acetaminophen (NORCO/VICODIN) 5-325 MG per tablet Take 1 tablet by mouth every 4 (four) hours as needed for moderate pain. 09/05/14   Jola Schmidt, MD  lisinopril (PRINIVIL,ZESTRIL) 10 MG tablet Take 10 mg by mouth daily.    [provider]  metFORMIN (GLUCOPHAGE) 500 MG tablet Take by mouth daily with breakfast.    [provider]  metoprolol (LOPRESSOR) 50 MG tablet Take 50 mg by mouth daily.    [provider]  naproxen (NAPROSYN) 375 MG tablet Take 1 tablet (375 mg total) by mouth 2 (two) times daily. Patient not taking: Reported on 08/30/2015 06/10/15   Dowless, Dondra Spry, PA-C    Family History History reviewed. No pertinent family history.  Social History Social History  Substance Use Topics  . Smoking status: Former Smoker    Quit date: 06/29/1968  . Smokeless tobacco: Never Used  .  Alcohol use No     Allergies   Patient has no known allergies.   Review of Systems Review of Systems   Physical Exam Updated Vital Signs BP (!) 157/81 (BP Location: Right Arm)   Pulse 92   Temp 99.4 F (37.4 C) (Oral)   Resp 16   Ht 6\' 1"  (1.854 m)   Wt 190 lb (86.2 kg)   SpO2 99%   BMI 25.07 kg/m   Physical Exam  Constitutional: He is oriented to person, place, and time. He appears well-developed and well-nourished. No distress.  HENT:  Head: Normocephalic and atraumatic.  Mouth/Throat: Oropharynx is clear and moist. No oropharyngeal exudate.  Eyes:  Conjunctivae and EOM are normal. Pupils are equal, round, and reactive to light.  Neck: Normal range of motion. Neck supple.  No meningismus.  Cardiovascular: Normal rate, regular rhythm, normal heart sounds and intact distal pulses.   No murmur heard. Pulmonary/Chest: Effort normal and breath sounds normal. No respiratory distress. He exhibits no tenderness.  Abdominal: Soft. Bowel sounds are normal. There is no tenderness. There is no rebound and no guarding.  Musculoskeletal: Normal range of motion. He exhibits no edema or tenderness.  Neurological: He is alert and oriented to person, place, and time. No cranial nerve deficit. He exhibits normal muscle tone. Coordination normal.   5/5 strength throughout. CN 2-12 intact.Equal grip strength.   Skin: Skin is warm.  Psychiatric: He has a normal mood and affect. His behavior is normal.  Nursing note and vitals reviewed.    ED Treatments / Results   DIAGNOSTIC STUDIES: Oxygen Saturation is 99% on RA, normal by my interpretation.   COORDINATION OF CARE: 11:29 PM-Discussed next steps with pt. Pt verbalized understanding and is agreeable with the plan.   Labs (all labs ordered are listed, but only abnormal results are displayed) Labs Reviewed  BASIC METABOLIC PANEL - Abnormal; Notable for the following:       Result Value   Sodium 130 (*)    Chloride 96 (*)    Glucose, Bld 171 (*)    BUN 22 (*)    All other components within normal limits  CBC - Abnormal; Notable for the following:    WBC 10.6 (*)    All other components within normal limits  HEPATIC FUNCTION PANEL - Abnormal; Notable for the following:    Total Bilirubin 2.5 (*)    Indirect Bilirubin 2.1 (*)    All other components within normal limits  LIPID PANEL - Abnormal; Notable for the following:    HDL 31 (*)    LDL Cholesterol 130 (*)    All other components within normal limits  GLUCOSE, CAPILLARY - Abnormal; Notable for the following:    Glucose-Capillary 127  (*)    All other components within normal limits  LIPASE, BLOOD  TROPONIN I  TROPONIN I  HEMOGLOBIN A1C  TROPONIN I  TROPONIN I  I-STAT TROPOININ, ED    EKG  EKG Interpretation  Date/Time:  Saturday December 12 2016 21:20:38 EDT Ventricular Rate:  87 PR Interval:  166 QRS Duration: 114 QT Interval:  384 QTC Calculation: 462 R Axis:   72 Text Interpretation:  Sinus rhythm with Premature supraventricular complexes Incomplete right bundle branch block Cannot rule out Inferior infarct , age undetermined Abnormal ECG new T wave inversion and ST depression v3 Confirmed by Ezequiel Essex [phone]) on 12/12/2016 11:21:09 PM       Radiology Dg Chest 2 View  Result Date: 12/12/2016 CLINICAL DATA:  Acute onset of midsternal chest pain, nausea and vomiting. Initial encounter. EXAM: CHEST  2 VIEW COMPARISON:  Chest radiograph performed 08/30/2015 FINDINGS: The lungs are well-aerated and clear. There is no evidence of focal opacification, pleural effusion or pneumothorax. The heart is normal in size; the mediastinal contour is within normal limits. No acute osseous abnormalities are seen. IMPRESSION: No acute cardiopulmonary process seen. Electronically Signed   By: Garald Balding M.D.   On: 12/12/2016 22:11    Procedures Procedures (including critical care time)  Medications Ordered in ED Medications - No data to display   Initial Impression / Assessment and Plan / ED Course  I have reviewed the triage vital signs and the nursing notes.  Pertinent labs & imaging results that were available during my care of the patient were reviewed by me and considered in my medical decision making (see chart for details).     Central chest pain x3 days with generalized weakness. Non exertional. Does not radiate, constant.   EKG with new T wave inversion in v3.  Troponin negative after 3 days of constant pain.  Pain improved somewhat with GI cocktail.  LFTs and lipase normal. Abdomen soft.  Elevate  bilirubin of uncertain etiology. Will obtain RUQ [place].  Plan admission for ACS rule out. D/w Dr. Blaine Hamper.  Final Clinical Impressions(s) / ED Diagnoses   Final diagnoses:  Diabetes mellitus without complication ([place])  Essential hypertension  Chest pain, unspecified type  Abnormal EKG  Elevated LFTs    New Prescriptions New Prescriptions   No medications on file  I personally performed the services described in this documentation, which was scribed in my presence. The recorded information has been reviewed and is accurate.     Ezequiel Essex, MD 12/13/16 [phone]

## 2016-12-13 ENCOUNTER — Observation Stay (HOSPITAL_COMMUNITY): Payer: Medicare Other

## 2016-12-13 ENCOUNTER — Encounter (HOSPITAL_COMMUNITY): Payer: Self-pay | Admitting: Internal Medicine

## 2016-12-13 DIAGNOSIS — I2510 Atherosclerotic heart disease of native coronary artery without angina pectoris: Secondary | ICD-10-CM | POA: Diagnosis present

## 2016-12-13 DIAGNOSIS — I10 Essential (primary) hypertension: Secondary | ICD-10-CM

## 2016-12-13 DIAGNOSIS — R9431 Abnormal electrocardiogram [ECG] [EKG]: Secondary | ICD-10-CM

## 2016-12-13 DIAGNOSIS — R079 Chest pain, unspecified: Secondary | ICD-10-CM | POA: Diagnosis present

## 2016-12-13 DIAGNOSIS — E119 Type 2 diabetes mellitus without complications: Secondary | ICD-10-CM

## 2016-12-13 LAB — HEPATIC FUNCTION PANEL
ALBUMIN: 3.9 g/dL (ref 3.5–5.0)
ALT: 18 U/L (ref 17–63)
AST: 22 U/L (ref 15–41)
Alkaline Phosphatase: 94 U/L (ref 38–126)
Bilirubin, Direct: 0.4 mg/dL (ref 0.1–0.5)
Indirect Bilirubin: 2.1 mg/dL — ABNORMAL HIGH (ref 0.3–0.9)
Total Bilirubin: 2.5 mg/dL — ABNORMAL HIGH (ref 0.3–1.2)
Total Protein: 7.1 g/dL (ref 6.5–8.1)

## 2016-12-13 LAB — LIPASE, BLOOD: Lipase: 44 U/L (ref 11–51)

## 2016-12-13 LAB — GLUCOSE, CAPILLARY
GLUCOSE-CAPILLARY: 127 mg/dL — AB (ref 65–99)
Glucose-Capillary: 194 mg/dL — ABNORMAL HIGH (ref 65–99)

## 2016-12-13 LAB — LIPID PANEL
CHOL/HDL RATIO: 6 ratio
CHOLESTEROL: 187 mg/dL (ref 0–200)
HDL: 31 mg/dL — AB (ref >40)
LDL Cholesterol: 130 mg/dL — ABNORMAL HIGH (ref 0–99)
Triglycerides: 129 mg/dL (ref <150)
VLDL: 26 mg/dL (ref 0–40)

## 2016-12-13 LAB — TROPONIN I
TROPONIN I: 0.11 ng/mL — AB (ref <0.03)
Troponin I: <0.03 ng/mL (ref <0.03)

## 2016-12-13 LAB — BILIRUBIN, FRACTIONATED(TOT/DIR/INDIR)
BILIRUBIN TOTAL: 3 mg/dL — AB (ref 0.3–1.2)
Bilirubin, Direct: 0.5 mg/dL (ref 0.1–0.5)
Indirect Bilirubin: 2.5 mg/dL — ABNORMAL HIGH (ref 0.3–0.9)

## 2016-12-13 MED ORDER — INSULIN ASPART 100 UNIT/ML ~~LOC~~ SOLN: 0.0000 [IU] | Freq: Every day | SUBCUTANEOUS | Status: DC

## 2016-12-13 MED ORDER — PANTOPRAZOLE SODIUM 40 MG PO TBEC: 40.0000 mg | DELAYED_RELEASE_TABLET | Freq: Every day | ORAL | Status: DC

## 2016-12-13 MED ORDER — NITROGLYCERIN 0.4 MG SL SUBL: 0.4000 mg | SUBLINGUAL_TABLET | SUBLINGUAL | Status: DC | PRN

## 2016-12-13 MED ORDER — ASPIRIN 325 MG PO TABS
325.0000 mg | ORAL_TABLET | Freq: Every day | ORAL | Status: DC
  Administered 2016-12-13: 325 mg via ORAL
  Filled 2016-12-13 (×2): qty 1

## 2016-12-13 MED ORDER — SODIUM CHLORIDE 0.9 % IV SOLN
INTRAVENOUS | Status: DC
  Administered 2016-12-13: 04:00:00 via INTRAVENOUS

## 2016-12-13 MED ORDER — MORPHINE SULFATE (PF) 4 MG/ML IV SOLN: 1.0000 mg | INTRAVENOUS | Status: DC | PRN

## 2016-12-13 MED ORDER — HYDROCODONE-ACETAMINOPHEN 5-325 MG PO TABS: 1.0000 | ORAL_TABLET | ORAL | Status: DC | PRN

## 2016-12-13 MED ORDER — HYDRALAZINE HCL 20 MG/ML IJ SOLN: 5.0000 mg | INTRAMUSCULAR | Status: DC | PRN

## 2016-12-13 MED ORDER — ENOXAPARIN SODIUM 30 MG/0.3ML ~~LOC~~ SOLN
30.0000 mg | SUBCUTANEOUS | Status: DC
  Administered 2016-12-13: 30 mg via SUBCUTANEOUS
  Filled 2016-12-13: qty 0.3

## 2016-12-13 MED ORDER — ACETAMINOPHEN 325 MG PO TABS: 650.0000 mg | ORAL_TABLET | ORAL | Status: DC | PRN

## 2016-12-13 MED ORDER — ATORVASTATIN CALCIUM 40 MG PO TABS: 40.0000 mg | ORAL_TABLET | Freq: Every day | ORAL | 0 refills | Status: DC

## 2016-12-13 MED ORDER — ONDANSETRON HCL 4 MG/2ML IJ SOLN: 4.0000 mg | Freq: Three times a day (TID) | INTRAMUSCULAR | Status: DC | PRN

## 2016-12-13 MED ORDER — METOPROLOL TARTRATE 50 MG PO TABS
50.0000 mg | ORAL_TABLET | Freq: Every day | ORAL | Status: DC
  Administered 2016-12-13: 50 mg via ORAL
  Filled 2016-12-13: qty 1

## 2016-12-13 MED ORDER — ZOLPIDEM TARTRATE 5 MG PO TABS: 5.0000 mg | ORAL_TABLET | Freq: Every evening | ORAL | Status: DC | PRN

## 2016-12-13 MED ORDER — INSULIN ASPART 100 UNIT/ML ~~LOC~~ SOLN: 0.0000 [IU] | Freq: Three times a day (TID) | SUBCUTANEOUS | Status: DC

## 2016-12-13 NOTE — H&P (Signed)
History and Physical    [name] Cook UXL:[id number] DOB: [birth year] DOA: 12/12/2016  Referring MD/NP/PA:   PCP: Prince Solian, MD   Patient coming from:  The patient is coming from home.  At baseline, pt is independent for most of ADL.  Chief Complaint: chest pain  HPI: [name] Cook is a 83 y.o. male with medical history significant of hypertension, diabetes mellitus, CAD, s/p of stent placement, HOH, dementia, who presents with chest pain.  Patient states that he has been having chest pain in the past 3 days, it is located in the substernal area, constant, dull, mild, 2 out of 10 in severity, nonradiating. It is not aggravated or alleviated by any factors. Patient denies cough, SOB, fever or chills. She has nausea, and vomited once yesterday, which has resolved. Currently no nausea, vomiting, diarrhea or abdominal pain. Denies symptoms of UTI or unilateral weakness.  ED Course: pt was found to have negative troponin, WBC 10.6, lipase 44, creatinine 1.04, temperature 99.4, oxygen saturation 98% on room air, negative chest x-ray. Patient is placed on telemetry bed for observation.  Review of Systems:   General: no fevers, chills, no changes in body weight, has poor appetite, has fatigue HEENT: no blurry vision, hearing changes or sore throat Respiratory: no dyspnea, coughing, wheezing CV: has chest pain, no palpitations GI: had nausea, vomiting, no abdominal pain, diarrhea, constipation GU: no dysuria, burning on urination, increased urinary frequency, hematuria  Ext: no leg edema Neuro: no unilateral weakness, numbness, or tingling, no vision change or hearing loss Skin:  Has dry skin MSK: No muscle spasm, no deformity, no limitation of range of movement in spin Heme: No easy bruising.  Travel history: No recent long distant travel.  Allergy: No Known Allergies  Past Medical History:  Diagnosis Date  . Cancer ([place]) 2002   skin, removed from face  . Coronary artery disease    . Dementia    mild  . Diabetes mellitus without complication ([place])   . HOH (hard of hearing)    does not wear hearing aides, but can hear you if you speak loudly  . Hypertension   . Stented coronary artery 2013   was done as a precautionary approach due to chest pain    Past Surgical History:  Procedure Laterality Date  . APPENDECTOMY    . CATARACT EXTRACTION W/PHACO Left 06/10/2016   Procedure: CATARACT EXTRACTION PHACO AND INTRAOCULAR LENS PLACEMENT (IOC);  Surgeon: Estill Cotta, MD;  Location: [facility] ORS;  Service: Ophthalmology;  Laterality: Left;  Lot # [id number] H [place]: 01:04.0 AP% 25.5 CDE: 29.46  . CHOLECYSTECTOMY    . CORONARY ANGIOPLASTY WITH STENT PLACEMENT    . HERNIA REPAIR     bilateral inguinal hernia    Social History:  reports that he quit smoking about 48 years ago. He has never used smokeless tobacco. He reports that he does not drink alcohol or use drugs.  Family History: Reviewed with patient, but patient does not remember any family medical history.   Prior to Admission medications   Medication Sig Start Date End Date Taking? Authorizing Provider  metoprolol (LOPRESSOR) 50 MG tablet Take 50 mg by mouth daily.   Yes [provider]  HYDROcodone-acetaminophen (NORCO/VICODIN) 5-325 MG per tablet Take 1 tablet by mouth every 4 (four) hours as needed for moderate pain. Patient not taking: Reported on 12/13/2016 09/05/14   Jola Schmidt, MD  naproxen (NAPROSYN) 375 MG tablet Take 1 tablet (375 mg total) by mouth 2 (two) times  daily. Patient not taking: Reported on 08/30/2015 06/10/15   [name], PA-C    Physical Exam: Vitals:   12/13/16 0200 12/13/16 0220 12/13/16 0240 12/13/16 0315  BP: (!) 177/104 (!) 152/68 (!) 145/72 (!) 156/63  Pulse: 73   73  Resp: 16 10 17 16   Temp:    98 F (36.7 C)  TempSrc:    Oral  SpO2: 97%   98%  Weight:    75 kg (165 lb 6.4 oz)  Height:    6\' 1"  (1.854 m)   General: Not in acute distress HEENT:        Eyes: PERRL, EOMI, no scleral icterus.       ENT: No discharge from the ears and nose, no pharynx injection, no tonsillar enlargement.        Neck: No JVD, no bruit, no mass felt. Heme: No neck lymph node enlargement. Cardiac: S1/S2, RRR, No murmurs, No gallops or rubs. Respiratory: Good air movement bilaterally. No rales, wheezing, rhonchi or rubs. GI: Soft, nondistended, nontender, no rebound pain, no organomegaly, BS present. GU: No hematuria Ext: No pitting leg edema bilaterally. 2+DP/PT pulse bilaterally. Musculoskeletal: No joint deformities, No joint redness or warmth, no limitation of ROM in spin. Skin: Has dry skin. Neuro: Alert, oriented X3, cranial nerves II-XII grossly intact, moves all extremities normally. Psych: Patient is not psychotic, no suicidal or hemocidal ideation.  Labs on Admission: I have personally reviewed following labs and imaging studies  CBC:  Recent Labs Lab 12/12/16 2135  WBC 10.6*  HGB 14.1  HCT 41.6  MCV 91.6  PLT 973   Basic Metabolic Panel:  Recent Labs Lab 12/12/16 2135  NA 130*  K 4.2  CL 96*  CO2 24  GLUCOSE 171*  BUN 22*  CREATININE 1.04  CALCIUM 8.9   GFR: Estimated Creatinine Clearance: 53.1 mL/min (by C-G formula based on SCr of 1.04 mg/dL). Liver Function Tests:  Recent Labs Lab 12/12/16 2333  AST 22  ALT 18  ALKPHOS 94  BILITOT 2.5*  PROT 7.1  ALBUMIN 3.9    Recent Labs Lab 12/12/16 2333  LIPASE 44   No results for input(s): AMMONIA in the last 168 hours. Coagulation Profile: No results for input(s): INR, PROTIME in the last 168 hours. Cardiac Enzymes:  Recent Labs Lab 12/13/16 0048  TROPONINI <0.03   BNP (last 3 results) No results for input(s): PROBNP in the last 8760 hours. HbA1C: No results for input(s): HGBA1C in the last 72 hours. CBG: No results for input(s): GLUCAP in the last 168 hours. Lipid Profile: No results for input(s): CHOL, HDL, LDLCALC, TRIG, CHOLHDL, LDLDIRECT in the last  72 hours. Thyroid Function Tests: No results for input(s): TSH, T4TOTAL, FREET4, T3FREE, THYROIDAB in the last 72 hours. Anemia Panel: No results for input(s): VITAMINB12, FOLATE, FERRITIN, TIBC, IRON, RETICCTPCT in the last 72 hours. Urine analysis: No results found for: COLORURINE, APPEARANCEUR, LABSPEC, PHURINE, GLUCOSEU, HGBUR, BILIRUBINUR, KETONESUR, PROTEINUR, UROBILINOGEN, NITRITE, LEUKOCYTESUR Sepsis Labs: @LABRCNTIP (procalcitonin:4,lacticidven:4) )No results found for this or any previous visit (from the past 240 hour(s)).   Radiological Exams on Admission: Dg Chest 2 View  Result Date: 12/12/2016 CLINICAL DATA:  Acute onset of midsternal chest pain, nausea and vomiting. Initial encounter. EXAM: CHEST  2 VIEW COMPARISON:  Chest radiograph performed 08/30/2015 FINDINGS: The lungs are well-aerated and clear. There is no evidence of focal opacification, pleural effusion or pneumothorax. The heart is normal in size; the mediastinal contour is within normal limits. No acute osseous  abnormalities are seen. IMPRESSION: No acute cardiopulmonary process seen. Electronically Signed   By: Garald Balding M.D.   On: 12/12/2016 22:11     EKG: Independently reviewed. Sinus rhythm, QTC 462, incomplete right bundle blockage, Q-wave in inferior leads   Assessment/Plan Principal Problem:   Chest pain Active Problems:   Diabetes mellitus without complication ([facility])   Coronary artery disease   Hypertension   Chest pain and hx of CAD: Etiology is not clear. Chest x-rays negative. No SOB, no signs of DVT, less likely to have PE. Has hx of stent placement, HTN, DM and old age as risk factors, will need to r/o ACS.  - will place on Tele bed for obs - cycle CE q6 x3 and repeat EKG in the am  - prn Nitroglycerin, Morphine, and aspirin, and metoprolol - Risk factor stratification: will check FLP and A1C  - 2d echo - start protonix for possible gerd  DM-II: Last A1c 7.3, not well controled. Patient  was taking metformin before, but not taking any medications currently.  -SSI -Check A1c  HTN: -continue metoprolol Additional IV hydralazine when necessary   DVT ppx: SQ Lovenox Code Status: Full code Family Communication: None at bed side.  Disposition Plan:  Anticipate discharge back to previous home environment Consults called:  none Admission status: Obs / tele    Date of Service 12/13/2016    Ivor Costa [facility] Pager [phone]  If 7PM-7AM, please contact night-coverage [email or web address] Password [facility] 12/13/2016, 3:23 AM

## 2016-12-13 NOTE — ED Notes (Signed)
Pt reports he hasnt taken his BP meds in 3 days.

## 2016-12-13 NOTE — Discharge Summary (Addendum)
Physician Discharge Summary  [name]:[id number] DOB: 12/27/1926 DOA: 12/12/2016  PCP: Prince Solian, MD  Admit date: 12/12/2016 Discharge date: 12/13/2016  Admitted From: Home Disposition: Home  Recommendations for Outpatient Follow-up:  1. Follow up with PCP in 1 week 2. Follow up with cardiology for echocardiogram/stress test (patient did not want to stay in the hospital for this) 3. Started on Lipitor for an LDL of 130 4. Follow-up bilirubin 5. Pending labs: Hemoglobin A1c  Home Health: None Equipment/Devices: None  Discharge Condition: Stable CODE STATUS: Full code Diet recommendation: Heart healthy   Brief/Interim Summary:  Admission HPI written by Ivor Costa, MD   Chief Complaint: chest pain  HPI: [name] Cook is a 83 y.o. male with medical history significant of hypertension, diabetes mellitus, CAD, s/p of stent placement, HOH, dementia, who presents with chest pain.  Patient states that he has been having chest pain in the past 3 days, it is located in the substernal area, constant, dull, mild, 2 out of 10 in severity, nonradiating. It is not aggravated or alleviated by any factors. Patient denies cough, SOB, fever or chills. She has nausea, and vomited once yesterday, which has resolved. Currently no nausea, vomiting, diarrhea or abdominal pain. Denies symptoms of UTI or unilateral weakness.  ED Course: pt was found to have negative troponin, WBC 10.6, lipase 44, creatinine 1.04, temperature 99.4, oxygen saturation 98% on room air, negative chest x-ray. Patient is placed on telemetry bed for observation.    Hospital course:  Chest pain Troponin trended up slightly to 0.11. Repeat EKG significant for resolution of t-wave changes. Chest pain resolved spontaneously. Non-specific and atypical. LDL of 130, so atorvastatin was started. Recommend outpatient echocardiogram and possible stress test since patient refusing to stay for longer workup.  Diabetes  mellitus, type 2 Diet controlled currently. A1c still pending.  Hypertension Continued metoprolol.  Hyperbilirubinemia Unknown etiology. Recommend outpatient follow-up.  Discharge Diagnoses:  Principal Problem:   Chest pain Active Problems:   Diabetes mellitus without complication [facility])   Coronary artery disease   Hypertension    Discharge Instructions  Discharge Instructions    Call MD for:  difficulty breathing, headache or visual disturbances    Complete by:  As directed    Call MD for:  persistant dizziness or light-headedness    Complete by:  As directed    Call MD for:  severe uncontrolled pain    Complete by:  As directed    Diet - low sodium heart healthy    Complete by:  As directed    Increase activity slowly    Complete by:  As directed      Allergies as of 12/13/2016   No Known Allergies     Medication List    STOP taking these medications   HYDROcodone-acetaminophen 5-325 MG tablet Commonly known as:  NORCO/VICODIN   naproxen 375 MG tablet Commonly known as:  NAPROSYN     TAKE these medications   atorvastatin 40 MG tablet Commonly known as:  LIPITOR Take 1 tablet (40 mg total) by mouth daily.   metoprolol tartrate 50 MG tablet Commonly known as:  LOPRESSOR Take 50 mg by mouth daily.      Follow-up Information    Avva, Ravisankar, MD. Schedule an appointment as soon as possible for a visit in 1 week(s).   Specialty:  Internal Medicine Contact information: [place] [address] [phone]        [facility]. Schedule an appointment as soon as possible for a visit in 1 week(s).   Why:  Echocardiogram. Contact information: [place] [address] [phone]         No Known Allergies  Consultations:  None   Procedures/Studies: Dg Chest 2 View  Result Date: 12/12/2016 CLINICAL DATA:  Acute onset of midsternal chest  pain, nausea and vomiting. Initial encounter. EXAM: CHEST  2 VIEW COMPARISON:  Chest radiograph performed 08/30/2015 FINDINGS: The lungs are well-aerated and clear. There is no evidence of focal opacification, pleural effusion or pneumothorax. The heart is normal in size; the mediastinal contour is within normal limits. No acute osseous abnormalities are seen. IMPRESSION: No acute cardiopulmonary process seen. Electronically Signed   By: Garald Balding M.D.   On: 12/12/2016 22:11       Subjective: Patient reports no chest pain or dyspnea. No palpitations  Discharge Exam: Vitals:   12/13/16 0240 12/13/16 0315  BP: (!) 145/72 (!) 156/63  Pulse:  73  Resp: 17 16  Temp:  98 F (36.7 C)   Vitals:   12/13/16 0200 12/13/16 0220 12/13/16 0240 12/13/16 0315  BP: (!) 177/104 (!) 152/68 (!) 145/72 (!) 156/63  Pulse: 73   73  Resp: 16 10 17 16   Temp:    98 F (36.7 C)  TempSrc:    Oral  SpO2: 97%   98%  Weight:    75 kg (165 lb 6.4 oz)  Height:    6\' 1"  (1.854 m)    General: Pt is alert, awake, not in acute distress Cardiovascular: RRR, S1/S2 +, no rubs, no gallops Respiratory: CTA bilaterally, no wheezing, no rhonchi Abdominal: Soft, NT, ND, bowel sounds + Extremities: no edema, no cyanosis    The results of significant diagnostics from this hospitalization (including imaging, microbiology, ancillary and laboratory) are listed below for reference.     Microbiology: No results found for this or any previous visit (from the past 240 hour(s)).   Labs: BNP (last 3 results) No results for input(s): BNP in the last 8760 hours. Basic Metabolic Panel:  Recent Labs Lab 12/12/16 2135  NA 130*  K 4.2  CL 96*  CO2 24  GLUCOSE 171*  BUN 22*  CREATININE 1.04  CALCIUM 8.9   Liver Function Tests:  Recent Labs Lab 12/12/16 2333  AST 22  ALT 18  ALKPHOS 94  BILITOT 2.5*  PROT 7.1  ALBUMIN 3.9    Recent Labs Lab 12/12/16 2333  LIPASE 44   No results for input(s):  AMMONIA in the last 168 hours. CBC:  Recent Labs Lab 12/12/16 2135  WBC 10.6*  HGB 14.1  HCT 41.6  MCV 91.6  PLT 338   Cardiac Enzymes:  Recent Labs Lab 12/13/16 0048 12/13/16 0331  TROPONINI <0.03 <0.03   CBG:  Recent Labs Lab 12/13/16 0645  GLUCAP 127*   Lipid Profile  Recent Labs  12/13/16 0331  CHOL 187  HDL 31*  LDLCALC 130*  TRIG 129  CHOLHDL 6.0     SIGNED:   Cordelia Poche, MD [facility] 12/13/2016, 10:14 AM Pager [phone]  If 7PM-7AM, please contact night-coverage [email or web address] Password [email or web address]

## 2016-12-13 NOTE — Progress Notes (Signed)
troponin  0.115 called to doctor, Discharged to home with family office visits in place teaching done

## 2016-12-13 NOTE — Discharge Instructions (Signed)
Nonspecific Chest Pain °Chest pain can be caused by many different conditions. There is always a chance that your pain could be related to something serious, such as a heart attack or a blood clot in your lungs. Chest pain can also be caused by conditions that are not life-threatening. If you have chest pain, it is very important to follow up with your health care provider. °What are the causes? °Causes of this condition include: °· Heartburn. °· Pneumonia or bronchitis. °· Anxiety or stress. °· Inflammation around your heart (pericarditis) or lung (pleuritis or pleurisy). °· A blood clot in your lung. °· A collapsed lung (pneumothorax). This can develop suddenly on its own (spontaneous pneumothorax) or from trauma to the chest. °· Shingles infection (varicella-zoster virus). °· Heart attack. °· Damage to the bones, muscles, and cartilage that make up your chest wall. This can include: °? Bruised bones due to injury. °? Strained muscles or cartilage due to frequent or repeated coughing or overwork. °? Fracture to one or more ribs. °? Sore cartilage due to inflammation (costochondritis). ° °What increases the risk? °Risk factors for this condition may include: °· Activities that increase your risk for trauma or injury to your chest. °· Respiratory infections or conditions that cause frequent coughing. °· Medical conditions or overeating that can cause heartburn. °· Heart disease or family history of heart disease. °· Conditions or health behaviors that increase your risk of developing a blood clot. °· Having had chicken pox (varicella zoster). ° °What are the signs or symptoms? °Chest pain can feel like: °· Burning or tingling on the surface of your chest or deep in your chest. °· Crushing, pressure, aching, or squeezing pain. °· Dull or sharp pain that is worse when you move, cough, or take a deep breath. °· Pain that is also felt in your back, neck, shoulder, or arm, or pain that spreads to any of these  areas. ° °Your chest pain may come and go, or it may stay constant. °How is this diagnosed? °Lab tests or other studies may be needed to find the cause of your pain. Your health care provider may have you take a test called an ECG (electrocardiogram). An ECG records your heartbeat patterns at the time the test is performed. You may also have other tests, such as: °· Transthoracic echocardiogram (TTE). In this test, sound waves are used to create a picture of the heart structures and to look at how blood flows through your heart. °· Transesophageal echocardiogram (TEE). This is a more advanced imaging test that takes images from inside your body. It allows your health care provider to see your heart in finer detail. °· Cardiac monitoring. This allows your health care provider to monitor your heart rate and rhythm in real time. °· Holter monitor. This is a portable device that records your heartbeat and can help to diagnose abnormal heartbeats. It allows your health care provider to track your heart activity for several days, if needed. °· Stress tests. These can be done through exercise or by taking medicine that makes your heart beat more quickly. °· Blood tests. °· Other imaging tests. ° °How is this treated? °Treatment depends on what is causing your chest pain. Treatment may include: °· Medicines. These may include: °? Acid blockers for heartburn. °? Anti-inflammatory medicine. °? Pain medicine for inflammatory conditions. °? Antibiotic medicine, if an infection is present. °? Medicines to dissolve blood clots. °? Medicines to treat coronary artery disease (CAD). °· Supportive care for conditions that   do not require medicines. This may include: °? Resting. °? Applying heat or cold packs to injured areas. °? Limiting activities until pain decreases. ° °Follow these instructions at home: °Medicines °· If you were prescribed an antibiotic, take it as told by your health care provider. Do not stop taking the  antibiotic even if you start to feel better. °· Take over-the-counter and prescription medicines only as told by your health care provider. °Lifestyle °· Do not use any products that contain nicotine or tobacco, such as cigarettes and e-cigarettes. If you need help quitting, ask your health care provider. °· Do not drink alcohol. °· Make lifestyle changes as directed by your health care provider. These may include: °? Getting regular exercise. Ask your health care provider to suggest some activities that are safe for you. °? Eating a heart-healthy diet. A registered dietitian can help you to learn healthy eating options. °? Maintaining a healthy weight. °? Managing diabetes, if necessary. °? Reducing stress, such as with yoga or relaxation techniques. °General instructions °· Avoid any activities that bring on chest pain. °· If heartburn is the cause for your chest pain, raise (elevate) the head of your bed about 6 inches (15 cm) by putting blocks under the legs. Sleeping with more pillows does not effectively relieve heartburn because it only changes the position of your head. °· Keep all follow-up visits as told by your health care provider. This is important. This includes any further testing if your chest pain does not go away. °Contact a health care provider if: °· Your chest pain does not go away. °· You have a rash with blisters on your chest. °· You have a fever. °· You have chills. °Get help right away if: °· Your chest pain is worse. °· You have a cough that gets worse, or you cough up blood. °· You have severe pain in your abdomen. °· You have severe weakness. °· You faint. °· You have sudden, unexplained chest discomfort. °· You have sudden, unexplained discomfort in your arms, back, neck, or jaw. °· You have shortness of breath at any time. °· You suddenly start to sweat, or your skin gets clammy. °· You feel nauseous or you vomit. °· You suddenly feel light-headed or dizzy. °· Your heart begins to beat  quickly, or it feels like it is skipping beats. °These symptoms may represent a serious problem that is an emergency. Do not wait to see if the symptoms will go away. Get medical help right away. Call your local emergency services (911 in the [place] Do not drive yourself to the hospital. °This information is not intended to replace advice given to you by your health care provider. Make sure you discuss any questions you have with your health care provider. °Document Released: 03/25/2005 Document Revised: 03/09/2016 Document Reviewed: 03/09/2016 °Elsevier Interactive Patient Education © 2017 [employer] ° °

## 2016-12-14 LAB — HEMOGLOBIN A1C
Hgb A1c MFr Bld: 6.9 % — ABNORMAL HIGH (ref 4.8–5.6)
Mean Plasma Glucose: 151 mg/dL

## 2016-12-22 ENCOUNTER — Ambulatory Visit (INDEPENDENT_AMBULATORY_CARE_PROVIDER_SITE_OTHER): Payer: Medicare Other | Admitting: Physician Assistant

## 2016-12-22 ENCOUNTER — Encounter: Payer: Self-pay | Admitting: Physician Assistant

## 2016-12-22 ENCOUNTER — Other Ambulatory Visit: Payer: Self-pay | Admitting: Physician Assistant

## 2016-12-22 DIAGNOSIS — I2510 Atherosclerotic heart disease of native coronary artery without angina pectoris: Secondary | ICD-10-CM

## 2016-12-22 DIAGNOSIS — I248 Other forms of acute ischemic heart disease: Secondary | ICD-10-CM

## 2016-12-22 DIAGNOSIS — E785 Hyperlipidemia, unspecified: Secondary | ICD-10-CM

## 2016-12-22 DIAGNOSIS — I10 Essential (primary) hypertension: Secondary | ICD-10-CM | POA: Diagnosis not present

## 2016-12-22 DIAGNOSIS — E1169 Type 2 diabetes mellitus with other specified complication: Secondary | ICD-10-CM | POA: Diagnosis not present

## 2016-12-22 MED ORDER — NITROGLYCERIN 0.4 MG SL SUBL
0.4000 mg | SUBLINGUAL_TABLET | SUBLINGUAL | 5 refills | Status: DC | PRN
Start: 2016-12-22 — End: 2017-05-23

## 2016-12-22 MED ORDER — METOPROLOL TARTRATE 50 MG PO TABS: 50.0000 mg | ORAL_TABLET | Freq: Two times a day (BID) | ORAL | 11 refills | Status: DC

## 2016-12-22 MED ORDER — ASPIRIN EC 81 MG PO TBEC: 81.0000 mg | DELAYED_RELEASE_TABLET | Freq: Every day | ORAL | 3 refills | Status: DC

## 2016-12-22 NOTE — Patient Instructions (Signed)
Medication Instructions:  START ASPRIN 81MG -BABY ASPIRIN START NITRO 0.4MG  AS NEEDED INCREASE METOPROLOL 50MG  TWICE DAILY  If you need a refill on your cardiac medications before your next appointment, please call your pharmacy.  Follow-Up: Your physician wants you to follow-up in: 3 MONTHS WITH DR Gwenlyn Found.   Thank you for choosing [facility] at [facility]!!

## 2016-12-22 NOTE — Progress Notes (Signed)
Cardiology Office Note   Date:  12/22/2016   ID:  [name] Cook, DOB 1927-05-16, MRN [id number]  PCP:  Chesley Noon, MD  Cardiologist:  New, was Dr Olevia Perches, now Dr Stacy Gardner, PA-C   History of Present Illness: [name] Cook is a 83 y.o. male with a history of HTN, DM, inf STEMI 2008 w/ DES RCA and med rx for 80% dCFX and 80% OM1, EF 60% at cath, [facility], dementia  Admitted 06/16-06/17/2018 for N&V, abd pain. Also had some mild chest pain, LDL was 130 and he was started on a statin, patient did not wish to stay in the hospital for stress test. Troponin trended up slightly to 0.11 and he had resolution of the T-wave changes seen on admission, A1c was 6.9. Appt made  [name] Cook presents for cardiology evaluation.  He lives alone and does his own housework and some yard work. He denies any chest pain with these activities.  His CP during his recent hospitalization was in the setting of an acute illness, felt to be a GI bug. He has had none since. The H&P describes 3 days of CP, but the pt says that was mainly an upset stomach.   He cannot remember the last time he had chest pain before then.   He has to climb 3 steps to get to his home, he does not get SOB with this. He walks around the block every day, does not get SOB or CP with this. No LE edema, no orthopnea or PND.   His BP is up today but pt has not had am Rx. He also takes metformin 500 mg, 2 tab in the am.  Sometimes his heart beats fast, it is brief and causes no sx. No hx presyncope or syncope. He has not fallen in a year or so. He has balance problems, uses a cane when he goes out.    Past Medical History:  Diagnosis Date  . Cancer ([place]) 2002   skin, removed from face  . Dementia    mild  . Diabetes mellitus without complication ([place])   . HOH (hard of hearing)    does not wear hearing aides, but can hear you if you speak loudly  . Hypertension   . STEMI involving right coronary artery ([place]) 2008   S/P 2.5 x 20 mm Taxus DES RCA, med rx for 30% LAD, 80% OM1 & dCFX, EF 60%    Past Surgical History:  Procedure Laterality Date  . APPENDECTOMY    . CATARACT EXTRACTION W/PHACO Left 06/10/2016   Procedure: CATARACT EXTRACTION PHACO AND INTRAOCULAR LENS PLACEMENT (IOC);  Surgeon: Estill Cotta, MD;  Location: [facility] ORS;  Service: Ophthalmology;  Laterality: Left;  Lot # [id number] H [place]: 01:04.0 AP% 25.5 CDE: 29.46  . CHOLECYSTECTOMY    . CORONARY ANGIOPLASTY WITH STENT PLACEMENT  2008   2.5 x 20 mm Taxus DES to 95% RCA, 80% dCFX, 80% OM1, 30% LAD > med rx, EF 60%  . HERNIA REPAIR     bilateral inguinal hernia    Current Outpatient Prescriptions  Medication Sig Dispense Refill  . atorvastatin (LIPITOR) 40 MG tablet Take 1 tablet (40 mg total) by mouth daily. 30 tablet 0  . metoprolol (LOPRESSOR) 50 MG tablet Take 50 mg by mouth daily.     No current facility-administered medications for this visit.     Allergies:   Patient has no known allergies.    Social History:  The  patient  reports that he quit smoking about 48 years ago. He has never used smokeless tobacco. He reports that he does not drink alcohol or use drugs.   Family History:  The patient's family history is not on file.    ROS:  Please see the history of present illness. All other systems are reviewed and negative.    PHYSICAL EXAM: VS:  There were no vitals taken for this visit. , BMI There is no height or weight on file to calculate BMI. GEN: Well nourished, well developed, male in no acute distress  HEENT: normal for age  Neck: no JVD, no carotid bruit, no masses Cardiac: RRR; Soft murmur, no rubs, or gallops Respiratory: Essentially clear to auscultation bilaterally, normal work of breathing GI: soft, nontender, nondistended, + BS MS: no deformity or atrophy; no edema; distal pulses are 2+ in all 4 extremities   Skin: warm and dry, no rash; skin is very dry and multiple lesions seen, possible sun  damage Neuro:  Strength and sensation are intact Psych: euthymic mood, full affect   EKG:  EKG is ordered today. The ekg ordered today demonstrates sinus rhythm, rate 70, borderline LVH and borderline right bundle branch block 06/16 ECG had right bundle branch block with septal T wave changes that resolved by 06/17  CATH: 12/18/2006 RESULTS:  1. The aortic pressure was 126/59 with a mean of 85; and the left      ventricle pressure was 126/14.  2. LEFT MAIN CORONARY ARTERY:  The left main coronary artery was free      of significant disease.  3. LEFT ANTERIOR DESCENDING ARTERY:  The left anterior descending      artery gave rise to three diagonal branches and a septal      perforator.  There was 30% of the proximal vessel and      irregularities in the proximal-and-mid vessel.  4. CIRCUMFLEX ARTERY:  The circumflex artery was a small-to-moderate-      sized vessel that gave rise to a marginal branch and a      posterolateral branch.  There was 80% osteal stenosis in the      marginal branch.  There was 80% stenosis in the mid-to-distal      vessel.  This vessel was a small caliber vessel that was 2.25 at      most in diameter.  5. RIGHT CORONARY ARTERY:  The right coronary artery is a moderate-      sized vessel that gave rise to a conus branch, a right ventricle      branch, a posterior descending branch, and two small posterolateral      branches.  There was 30% narrowing in the proximal right coronary      artery.  There was 70% ostial narrowing in a right ventricle      branch.  There was 95% narrowing in the proximal-to-mid, posterior,      descending branch.  LEFT VENTRICULOGRAM:  1. The left ventriculogram performed in the RAO projection showed      hypokinesis of the mid inferior wall.  The overall wall motion was      good with an estimated fraction of 60%.  2. The left ventriculogram performed in the LAO projection showed good      wall motion with no areas of  hypokinesis.  Following 2.5 x 20 mm Taxus stenting of the lesion in the posterior descending branch of  the right coronary artery, stenosis improved from 95%  to 0% and the flow  was TIMI 3; before, and at the end of intervention.  The patient had the onset of chest pain at 1730, and arrived in the  emergency department at [place].  He arrived in the cath lab at 1850.  First balloon inflation was at [place].  This gave a door to balloon time  of 85 minutes and refusion time of 1 hour 51 minutes.  CONCLUSION:  1. Acute inferior wall myocardial infarction with 95% stenosis in the      posterior branch of the right coronary artery, 30% narrowing in the      proximal right coronary, 30% narrowing in the proximal LAD, 80%      narrowing at the ostium of a first marginal branch of the      circumflex artery, and 80% narrowing in the mid distal circumflex      artery with mild mid inferior wall hypokinesis and estimated      fraction of 60%.  2. Successful PCI of the lesion in the posterior branch of the right      coronary using a Taxus drug-eluting stent with improvement in the      central narrowing from 95% to 0%.   Recent Labs: 12/12/2016: ALT 18; BUN 22; Creatinine, Ser 1.04; Hemoglobin 14.1; Platelets 338; Potassium 4.2; Sodium 130    Lipid Panel    Component Value Date/Time   CHOL 187 12/13/2016 0331   TRIG 129 12/13/2016 0331   HDL 31 (L) 12/13/2016 0331   CHOLHDL 6.0 12/13/2016 0331   VLDL 26 12/13/2016 0331   LDLCALC 130 (H) 12/13/2016 0331     Wt Readings from Last 3 Encounters:  12/13/16 165 lb 6.4 oz (75 kg)  06/10/16 170 lb (77.1 kg)     Other studies Reviewed: Additional studies/ records that were reviewed today include: Office notes, hospital records and testing.  ASSESSMENT AND PLAN: The patient and the plan were reviewed with Dr. Gwenlyn Found who agrees  1.  Demand ischemia, CAD: Had a stent to his RCA/PDA in 2008, medical therapy for moderate disease in other vessels.  He's had no ischemic evaluation since then. He had a minimal elevation in his troponin during his recent hospitalization, consistent with supply-demand mismatch. He has no ongoing ischemic symptoms.  I discussed the possibility of stress testing versus cardiac catheterization with the patient. I advised that I felt cardiac catheterization would be the better test since he has known disease. However, the patient does not wish to pursue any further evaluation at this time. He feels his functional status is good and he is not having any symptoms at baseline. He is encouraged to contact us if he changes his mind or gets more chest pain.  Continue high-dose statin, add baby aspirin and sublingual nitroglycerin when necessary, increase his beta blocker.  2. Hypertension: He takes Lopressor 50 mg once a day, we will increase this to twice a day and see how it is tolerated  3. Dyslipidemia with high LDL, associated with type 2 diabetes: Continue Lipitor, follow-up in 3 months  4. CODE STATUS: I described the different interventions that were possible with CPR, intubation, defibrillation medications. He does not wish to pursue any extraordinary measures. He is clear in his wishes. He will be made a no CODE BLUE.   Current medicines are reviewed at length with the patient today.  The patient does not have concerns regarding medicines.  The following changes have been made:  Add baby aspirin, sublingual  nitroglycerin, increase beta blocker  Labs/ tests ordered today include:  No orders of the defined types were placed in this encounter.    Disposition:   FU with Dr. Gwenlyn Found  Signed, Barrett, [address]  12/22/2016 9:25 AM    [place] Phone: [phone]; Fax: [phone]  This note was written with the assistance of speech recognition software. Please excuse any transcriptional errors.

## 2017-01-04 ENCOUNTER — Other Ambulatory Visit: Payer: Self-pay | Admitting: Physician Assistant

## 2017-03-04 ENCOUNTER — Emergency Department (HOSPITAL_COMMUNITY)
Admission: EM | Admit: 2017-03-04 | Discharge: 2017-03-04 | Disposition: A | Discharge status: Home / Self Care | Payer: Medicare Other | Attending: Emergency Medicine | Admitting: Emergency Medicine

## 2017-03-04 ENCOUNTER — Encounter (HOSPITAL_COMMUNITY): Payer: Self-pay | Admitting: Emergency Medicine

## 2017-03-04 DIAGNOSIS — W230XXA Caught, crushed, jammed, or pinched between moving objects, initial encounter: Secondary | ICD-10-CM | POA: Diagnosis not present

## 2017-03-04 DIAGNOSIS — Y999 Unspecified external cause status: Secondary | ICD-10-CM | POA: Insufficient documentation

## 2017-03-04 DIAGNOSIS — Y929 Unspecified place or not applicable: Secondary | ICD-10-CM | POA: Diagnosis not present

## 2017-03-04 DIAGNOSIS — Y939 Activity, unspecified: Secondary | ICD-10-CM | POA: Insufficient documentation

## 2017-03-04 DIAGNOSIS — Z5321 Procedure and treatment not carried out due to patient leaving prior to being seen by health care provider: Secondary | ICD-10-CM | POA: Insufficient documentation

## 2017-03-04 DIAGNOSIS — S59911A Unspecified injury of right forearm, initial encounter: Secondary | ICD-10-CM | POA: Diagnosis present

## 2017-03-04 NOTE — ED Notes (Signed)
Pt stated he wanted to get home because he was very worried about his cats. Pt was informed the PA would be with him shortly. Pt still wished to leave. Bleeding controlled on wound. Pt verbalized understanding of risks of leaving AMA

## 2017-03-04 NOTE — ED Triage Notes (Signed)
Pt reports his car hood fell on his R arm. Pt has a skin tear to R lower arm. Pt denies any pain or difficulty moving elbow.

## 2017-03-06 ENCOUNTER — Encounter (HOSPITAL_COMMUNITY): Payer: Self-pay | Admitting: Emergency Medicine

## 2017-03-06 ENCOUNTER — Emergency Department (HOSPITAL_COMMUNITY)
Admission: EM | Admit: 2017-03-06 | Discharge: 2017-03-06 | Disposition: A | Discharge status: Home / Self Care | Payer: Medicare Other | Attending: Emergency Medicine | Admitting: Emergency Medicine

## 2017-03-06 DIAGNOSIS — Z7982 Long term (current) use of aspirin: Secondary | ICD-10-CM | POA: Diagnosis not present

## 2017-03-06 DIAGNOSIS — Y9389 Activity, other specified: Secondary | ICD-10-CM | POA: Diagnosis not present

## 2017-03-06 DIAGNOSIS — S51819A Laceration without foreign body of unspecified forearm, initial encounter: Secondary | ICD-10-CM

## 2017-03-06 DIAGNOSIS — W228XXA Striking against or struck by other objects, initial encounter: Secondary | ICD-10-CM | POA: Insufficient documentation

## 2017-03-06 DIAGNOSIS — Y929 Unspecified place or not applicable: Secondary | ICD-10-CM | POA: Insufficient documentation

## 2017-03-06 DIAGNOSIS — I259 Chronic ischemic heart disease, unspecified: Secondary | ICD-10-CM | POA: Insufficient documentation

## 2017-03-06 DIAGNOSIS — Z79899 Other long term (current) drug therapy: Secondary | ICD-10-CM | POA: Insufficient documentation

## 2017-03-06 DIAGNOSIS — Z87891 Personal history of nicotine dependence: Secondary | ICD-10-CM | POA: Insufficient documentation

## 2017-03-06 DIAGNOSIS — E119 Type 2 diabetes mellitus without complications: Secondary | ICD-10-CM | POA: Insufficient documentation

## 2017-03-06 DIAGNOSIS — Z85828 Personal history of other malignant neoplasm of skin: Secondary | ICD-10-CM | POA: Insufficient documentation

## 2017-03-06 DIAGNOSIS — S51801A Unspecified open wound of right forearm, initial encounter: Secondary | ICD-10-CM | POA: Diagnosis not present

## 2017-03-06 DIAGNOSIS — Z955 Presence of coronary angioplasty implant and graft: Secondary | ICD-10-CM | POA: Diagnosis not present

## 2017-03-06 DIAGNOSIS — I10 Essential (primary) hypertension: Secondary | ICD-10-CM | POA: Insufficient documentation

## 2017-03-06 DIAGNOSIS — S59911A Unspecified injury of right forearm, initial encounter: Secondary | ICD-10-CM | POA: Diagnosis present

## 2017-03-06 DIAGNOSIS — Y999 Unspecified external cause status: Secondary | ICD-10-CM | POA: Diagnosis not present

## 2017-03-06 MED ORDER — CEPHALEXIN 500 MG PO CAPS: 500.0000 mg | ORAL_CAPSULE | Freq: Three times a day (TID) | ORAL | 0 refills | Status: DC

## 2017-03-06 NOTE — Discharge Instructions (Signed)
Take keflex three times daily for 5 days to prevent infection.   Keep dressing on today. You may remove it tomorrow. Keep wound clean and dry.   See your doctor this week   Return to ER if you have fever, worse redness around the are, purulent discharge, severe pain and swelling

## 2017-03-06 NOTE — ED Provider Notes (Signed)
[facility] DEPT Provider Note   CSN: [id number] Arrival date & time: 03/06/17  6378     History   Chief Complaint Chief Complaint  Patient presents with  . Wound Check    HPI MACARIO SHEAR is a 83 y.o. male hx of DM, dementia, HTN Here presenting with right forearm injury. Patient states that 2 days ago he was fixing his car and the car cart fell on the right arm. Is noted to have a skin tear to the right arm. He came to the ER but left without being seen due to the wait times. He states that there is some slightly more warmth and redness to the right elbow area. He states that he is still able to move his elbow and denies any pain on his elbow. States that his tetanus is up-to-date. He checked his blood sugar and that was 1:30 today and denies any fevers or chills.    The history is provided by the patient.    Past Medical History:  Diagnosis Date  . Cancer ([place]) 2002   skin, removed from face  . Dementia    mild  . Diabetes mellitus without complication ([place])   . HOH (hard of hearing)    does not wear hearing aides, but can hear you if you speak loudly  . Hypertension   . STEMI involving right coronary artery ([place]) 2008   S/P 2.5 x 20 mm Taxus DES RCA, med rx for 30% LAD, 80% OM1 & dCFX, EF 60%    Patient Active Problem List   Diagnosis Date Noted  . Chest pain 12/13/2016  . Diabetes mellitus without complication ([place])   . Coronary artery disease   . Hypertension     Past Surgical History:  Procedure Laterality Date  . APPENDECTOMY    . CATARACT EXTRACTION W/PHACO Left 06/10/2016   Procedure: CATARACT EXTRACTION PHACO AND INTRAOCULAR LENS PLACEMENT (IOC);  Surgeon: Estill Cotta, MD;  Location: [facility] ORS;  Service: Ophthalmology;  Laterality: Left;  Lot # [id number] H [place]: 01:04.0 AP% 25.5 CDE: 29.46  . CHOLECYSTECTOMY    . CORONARY ANGIOPLASTY WITH STENT PLACEMENT  2008   2.5 x 20 mm Taxus DES to 95% RCA, 80% dCFX, 80% OM1, 30% LAD > med rx, EF 60%  . HERNIA  REPAIR     bilateral inguinal hernia       Home Medications    Prior to Admission medications   Medication Sig Start Date End Date Taking? Authorizing Provider  aspirin EC 81 MG tablet Take 1 tablet (81 mg total) by mouth daily. 12/22/16   Barrett, Evelene Croon, PA-C  atorvastatin (LIPITOR) 40 MG tablet Take 1 tablet (40 mg total) by mouth daily. 01/05/17   Barrett, Evelene Croon, PA-C  metoprolol tartrate (LOPRESSOR) 50 MG tablet Take 1 tablet (50 mg total) by mouth 2 (two) times daily. 12/22/16   Barrett, Evelene Croon, PA-C  nitroGLYCERIN (NITROSTAT) 0.4 MG SL tablet Place 1 tablet (0.4 mg total) under the tongue every 5 (five) minutes as needed for chest pain. 12/22/16 03/22/17  Barrett, Evelene Croon, PA-C    Family History History reviewed. No pertinent family history.  Social History Social History  Substance Use Topics  . Smoking status: Former Smoker    Quit date: 06/29/1968  . Smokeless tobacco: Never Used  . Alcohol use No     Allergies   Patient has no known allergies.   Review of Systems Review of Systems  Skin: Positive for wound.  All other  systems reviewed and are negative.    Physical Exam Updated Vital Signs BP (!) 163/71 (BP Location: Left Arm)   Pulse 76   Temp 99.3 F (37.4 C) (Oral)   Resp 16   SpO2 95%   Physical Exam  Constitutional: He is oriented to person, place, and time. He appears well-developed.  HENT:  Head: Normocephalic.  Mouth/Throat: Oropharynx is clear and moist.  Eyes: Pupils are equal, round, and reactive to light.  Neck: Normal range of motion.  Cardiovascular: Normal rate.   Pulmonary/Chest: Effort normal.  Abdominal: Soft.  Musculoskeletal:  2 cm skin tear R proximal forearm, mild redness and drainage around it. No obvious abscess. No active bleeding. Nl ROM R elbow, no obvious elbow effusion. No bony tenderness. Neurovascular intact RUE   Neurological: He is alert and oriented to person, place, and time.  Skin: Skin is warm. There is  erythema.  Psychiatric: He has a normal mood and affect.  Nursing note and vitals reviewed.    ED Treatments / Results  Labs (all labs ordered are listed, but only abnormal results are displayed) Labs Reviewed - No data to display  EKG  EKG Interpretation None       Radiology No results found.  Procedures Procedures (including critical care time)  The wound is cleansed, debrided of foreign material as much as possible, and dressed. The patient is alerted to watch for any signs of infection (redness, pus, pain, increased swelling or fever) and call if such occurs. Home wound care instructions are provided. Tetanus vaccination status reviewed: last tetanus booster within 10 years.   Medications Ordered in ED Medications - No data to display   Initial Impression / Assessment and Plan / ED Course  I have reviewed the triage vital signs and the nursing notes.  Pertinent labs & imaging results that were available during my care of the patient were reviewed by me and considered in my medical decision making (see chart for details).     ARMSTRONG CREASY is a 83 y.o. male here with R forearm skin tear. There is some redness around it. Tdap up to date. I cleaned the area and applied petroleum dressing and ace wrap. No signs of fracture. Will dc home with keflex for prophylaxis. Gave strict return precautions.   Final Clinical Impressions(s) / ED Diagnoses   Final diagnoses:  None    New Prescriptions New Prescriptions   No medications on file     Drenda Freeze, MD 03/06/17 [phone]

## 2017-03-06 NOTE — ED Triage Notes (Signed)
The hood of pt's car fell on his R forearm 2 days ago. Pt has skin tear to proximal forearm. Came 2 days ago for same, but left prior to being seen. Full ROM of elbow.

## 2017-03-26 ENCOUNTER — Ambulatory Visit: Payer: Medicare Other | Admitting: Cardiovascular Disease

## 2017-05-17 ENCOUNTER — Inpatient Hospital Stay (HOSPITAL_COMMUNITY)
Admission: EM | Admit: 2017-05-17 | Discharge: 2017-05-20 | DRG: 543 | Disposition: A | Discharge status: Skilled Nursing Facility | Payer: Medicare Other | Attending: Internal Medicine | Admitting: Internal Medicine

## 2017-05-17 ENCOUNTER — Emergency Department (HOSPITAL_COMMUNITY): Payer: Medicare Other

## 2017-05-17 ENCOUNTER — Other Ambulatory Visit: Payer: Self-pay

## 2017-05-17 ENCOUNTER — Encounter (HOSPITAL_COMMUNITY): Payer: Self-pay

## 2017-05-17 DIAGNOSIS — R5381 Other malaise: Secondary | ICD-10-CM

## 2017-05-17 DIAGNOSIS — W1830XA Fall on same level, unspecified, initial encounter: Secondary | ICD-10-CM | POA: Diagnosis present

## 2017-05-17 DIAGNOSIS — E119 Type 2 diabetes mellitus without complications: Secondary | ICD-10-CM | POA: Diagnosis present

## 2017-05-17 DIAGNOSIS — Z7982 Long term (current) use of aspirin: Secondary | ICD-10-CM | POA: Diagnosis not present

## 2017-05-17 DIAGNOSIS — R748 Abnormal levels of other serum enzymes: Secondary | ICD-10-CM

## 2017-05-17 DIAGNOSIS — T796XXA Traumatic ischemia of muscle, initial encounter: Secondary | ICD-10-CM | POA: Diagnosis present

## 2017-05-17 DIAGNOSIS — Z9842 Cataract extraction status, left eye: Secondary | ICD-10-CM | POA: Diagnosis not present

## 2017-05-17 DIAGNOSIS — Z66 Do not resuscitate: Secondary | ICD-10-CM | POA: Diagnosis present

## 2017-05-17 DIAGNOSIS — Z9049 Acquired absence of other specified parts of digestive tract: Secondary | ICD-10-CM

## 2017-05-17 DIAGNOSIS — Z85828 Personal history of other malignant neoplasm of skin: Secondary | ICD-10-CM

## 2017-05-17 DIAGNOSIS — Z955 Presence of coronary angioplasty implant and graft: Secondary | ICD-10-CM

## 2017-05-17 DIAGNOSIS — Z7984 Long term (current) use of oral hypoglycemic drugs: Secondary | ICD-10-CM

## 2017-05-17 DIAGNOSIS — N390 Urinary tract infection, site not specified: Secondary | ICD-10-CM | POA: Diagnosis not present

## 2017-05-17 DIAGNOSIS — M4856XA Collapsed vertebra, not elsewhere classified, lumbar region, initial encounter for fracture: Secondary | ICD-10-CM | POA: Diagnosis present

## 2017-05-17 DIAGNOSIS — Z961 Presence of intraocular lens: Secondary | ICD-10-CM | POA: Diagnosis present

## 2017-05-17 DIAGNOSIS — I252 Old myocardial infarction: Secondary | ICD-10-CM | POA: Diagnosis not present

## 2017-05-17 DIAGNOSIS — R269 Unspecified abnormalities of gait and mobility: Secondary | ICD-10-CM | POA: Diagnosis present

## 2017-05-17 DIAGNOSIS — S22000A Wedge compression fracture of unspecified thoracic vertebra, initial encounter for closed fracture: Secondary | ICD-10-CM | POA: Diagnosis not present

## 2017-05-17 DIAGNOSIS — I10 Essential (primary) hypertension: Secondary | ICD-10-CM | POA: Diagnosis present

## 2017-05-17 DIAGNOSIS — N329 Bladder disorder, unspecified: Secondary | ICD-10-CM | POA: Diagnosis present

## 2017-05-17 DIAGNOSIS — F0390 Unspecified dementia without behavioral disturbance: Secondary | ICD-10-CM | POA: Diagnosis present

## 2017-05-17 DIAGNOSIS — R319 Hematuria, unspecified: Secondary | ICD-10-CM | POA: Diagnosis present

## 2017-05-17 DIAGNOSIS — M4854XA Collapsed vertebra, not elsewhere classified, thoracic region, initial encounter for fracture: Secondary | ICD-10-CM | POA: Diagnosis not present

## 2017-05-17 DIAGNOSIS — Z87891 Personal history of nicotine dependence: Secondary | ICD-10-CM | POA: Diagnosis not present

## 2017-05-17 DIAGNOSIS — H9190 Unspecified hearing loss, unspecified ear: Secondary | ICD-10-CM | POA: Diagnosis present

## 2017-05-17 DIAGNOSIS — N136 Pyonephrosis: Secondary | ICD-10-CM | POA: Diagnosis present

## 2017-05-17 LAB — URINALYSIS, ROUTINE W REFLEX MICROSCOPIC
Bilirubin Urine: NEGATIVE
Glucose, UA: NEGATIVE mg/dL
Ketones, ur: 5 mg/dL — AB
Nitrite: NEGATIVE
Protein, ur: 100 mg/dL — AB
Specific Gravity, Urine: 1.02 (ref 1.005–1.030)
pH: 5 (ref 5.0–8.0)

## 2017-05-17 LAB — CBC WITH DIFFERENTIAL/PLATELET
Basophils Absolute: 0 10*3/uL (ref 0.0–0.1)
Basophils Relative: 0 %
Eosinophils Absolute: 0 10*3/uL (ref 0.0–0.7)
Eosinophils Relative: 0 %
HCT: 41.2 % (ref 39.0–52.0)
Hemoglobin: 13.8 g/dL (ref 13.0–17.0)
Lymphocytes Relative: 8 %
Lymphs Abs: 1.1 10*3/uL (ref 0.7–4.0)
MCH: 30.8 pg (ref 26.0–34.0)
MCHC: 33.5 g/dL (ref 30.0–36.0)
MCV: 92 fL (ref 78.0–100.0)
Monocytes Absolute: 0.7 10*3/uL (ref 0.1–1.0)
Monocytes Relative: 5 %
Neutro Abs: 12.3 10*3/uL — ABNORMAL HIGH (ref 1.7–7.7)
Neutrophils Relative %: 87 %
Platelets: 279 10*3/uL (ref 150–400)
RBC: 4.48 MIL/uL (ref 4.22–5.81)
RDW: 13.3 % (ref 11.5–15.5)
WBC: 14.3 10*3/uL — ABNORMAL HIGH (ref 4.0–10.5)

## 2017-05-17 LAB — BASIC METABOLIC PANEL
Anion gap: 9 (ref 5–15)
BUN: 19 mg/dL (ref 6–20)
CO2: 26 mmol/L (ref 22–32)
Calcium: 8.7 mg/dL — ABNORMAL LOW (ref 8.9–10.3)
Chloride: 100 mmol/L — ABNORMAL LOW (ref 101–111)
Creatinine, Ser: 0.86 mg/dL (ref 0.61–1.24)
GFR calc Af Amer: >60 mL/min (ref >60)
GFR calc non Af Amer: >60 mL/min (ref >60)
Glucose, Bld: 180 mg/dL — ABNORMAL HIGH (ref 65–99)
Potassium: 3.4 mmol/L — ABNORMAL LOW (ref 3.5–5.1)
Sodium: 135 mmol/L (ref 135–145)

## 2017-05-17 LAB — GLUCOSE, CAPILLARY
Glucose-Capillary: 140 mg/dL — ABNORMAL HIGH (ref 65–99)
Glucose-Capillary: 140 mg/dL — ABNORMAL HIGH (ref 65–99)

## 2017-05-17 LAB — MAGNESIUM: Magnesium: 1.6 mg/dL — ABNORMAL LOW (ref 1.7–2.4)

## 2017-05-17 LAB — CK: Total CK: 3259 U/L — ABNORMAL HIGH (ref 49–397)

## 2017-05-17 LAB — CBG MONITORING, ED: Glucose-Capillary: 139 mg/dL — ABNORMAL HIGH (ref 65–99)

## 2017-05-17 MED ORDER — ASPIRIN EC 81 MG PO TBEC
81.0000 mg | DELAYED_RELEASE_TABLET | Freq: Every day | ORAL | Status: DC
  Administered 2017-05-17 – 2017-05-20 (×4): 81 mg via ORAL
  Filled 2017-05-17 (×4): qty 1

## 2017-05-17 MED ORDER — ROSUVASTATIN CALCIUM 20 MG PO TABS
20.0000 mg | ORAL_TABLET | Freq: Every day | ORAL | Status: DC
  Administered 2017-05-17 – 2017-05-18 (×2): 20 mg via ORAL
  Filled 2017-05-17 (×2): qty 1

## 2017-05-17 MED ORDER — DEXTROSE 5 % IV SOLN
1.0000 g | Freq: Once | INTRAVENOUS | Status: AC
  Administered 2017-05-17: 1 g via INTRAVENOUS
  Filled 2017-05-17: qty 10

## 2017-05-17 MED ORDER — DEXTROSE 5 % IV SOLN
1.0000 g | INTRAVENOUS | Status: DC
  Administered 2017-05-18 – 2017-05-19 (×2): 1 g via INTRAVENOUS
  Filled 2017-05-17 (×2): qty 10

## 2017-05-17 MED ORDER — MORPHINE SULFATE (PF) 2 MG/ML IV SOLN
1.0000 mg | INTRAVENOUS | Status: DC | PRN
  Administered 2017-05-18 (×2): 1 mg via INTRAVENOUS
  Filled 2017-05-17 (×2): qty 1

## 2017-05-17 MED ORDER — SODIUM CHLORIDE 0.9% FLUSH: 3.0000 mL | Freq: Two times a day (BID) | INTRAVENOUS | Status: DC

## 2017-05-17 MED ORDER — FENTANYL CITRATE (PF) 100 MCG/2ML IJ SOLN
50.0000 ug | Freq: Once | INTRAMUSCULAR | Status: AC
  Administered 2017-05-17: 50 ug via INTRAVENOUS
  Filled 2017-05-17: qty 2

## 2017-05-17 MED ORDER — INSULIN ASPART 100 UNIT/ML ~~LOC~~ SOLN
0.0000 [IU] | Freq: Three times a day (TID) | SUBCUTANEOUS | Status: DC
  Administered 2017-05-17: 1 [IU] via SUBCUTANEOUS
  Administered 2017-05-18: 2 [IU] via SUBCUTANEOUS
  Administered 2017-05-18 – 2017-05-19 (×4): 1 [IU] via SUBCUTANEOUS
  Administered 2017-05-19: 2 [IU] via SUBCUTANEOUS
  Administered 2017-05-20: 1 [IU] via SUBCUTANEOUS
  Administered 2017-05-20: 3 [IU] via SUBCUTANEOUS

## 2017-05-17 MED ORDER — SODIUM CHLORIDE 0.9 % IV SOLN
INTRAVENOUS | Status: DC
  Administered 2017-05-18: 1000 mL via INTRAVENOUS
  Administered 2017-05-18: 14:00:00 via INTRAVENOUS

## 2017-05-17 MED ORDER — HYDROMORPHONE HCL 2 MG/ML IJ SOLN
INTRAMUSCULAR | Status: AC
  Filled 2017-05-17: qty 1

## 2017-05-17 MED ORDER — ACETAMINOPHEN 650 MG RE SUPP: 650.0000 mg | Freq: Four times a day (QID) | RECTAL | Status: DC | PRN

## 2017-05-17 MED ORDER — ACETAMINOPHEN 325 MG PO TABS: 650.0000 mg | ORAL_TABLET | Freq: Four times a day (QID) | ORAL | Status: DC | PRN

## 2017-05-17 MED ORDER — ONDANSETRON HCL 4 MG/2ML IJ SOLN: 4.0000 mg | Freq: Four times a day (QID) | INTRAMUSCULAR | Status: DC | PRN

## 2017-05-17 MED ORDER — SODIUM CHLORIDE 0.9 % IV BOLUS (SEPSIS)
500.0000 mL | Freq: Once | INTRAVENOUS | Status: AC
  Administered 2017-05-17: 500 mL via INTRAVENOUS

## 2017-05-17 MED ORDER — ONDANSETRON HCL 4 MG PO TABS: 4.0000 mg | ORAL_TABLET | Freq: Four times a day (QID) | ORAL | Status: DC | PRN

## 2017-05-17 MED ORDER — SODIUM CHLORIDE 0.9 % IV SOLN
INTRAVENOUS | Status: DC
  Administered 2017-05-17: 14:00:00 via INTRAVENOUS

## 2017-05-17 MED ORDER — ENOXAPARIN SODIUM 40 MG/0.4ML ~~LOC~~ SOLN
40.0000 mg | SUBCUTANEOUS | Status: DC
  Administered 2017-05-17 – 2017-05-19 (×3): 40 mg via SUBCUTANEOUS
  Filled 2017-05-17 (×3): qty 0.4

## 2017-05-17 MED ORDER — NITROGLYCERIN 0.4 MG SL SUBL: 0.4000 mg | SUBLINGUAL_TABLET | SUBLINGUAL | Status: DC | PRN

## 2017-05-17 MED ORDER — METOPROLOL TARTRATE 50 MG PO TABS
50.0000 mg | ORAL_TABLET | Freq: Every day | ORAL | Status: DC
  Administered 2017-05-17 – 2017-05-20 (×4): 50 mg via ORAL
  Filled 2017-05-17 (×4): qty 1

## 2017-05-17 NOTE — ED Notes (Signed)
Bed: WA21 Expected date:  Expected time:  Means of arrival:  Comments: EMS fall  

## 2017-05-17 NOTE — ED Notes (Signed)
Called to give report. Was told by secretary that I needed to wait until the phone number was put in. I relayed that this was incorrect as we are to call report when bed is assigned for 20 mins. RN taking report is with another patient and I am awaiting a call back when she is available. Will call back at 1540 if call is not received.
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
## 2017-05-17 NOTE — ED Notes (Signed)
ED TO INPATIENT HANDOFF REPORT  Name/Age/Gender [name] Cook 83 y.o. male  Code Status Code Status History    Date Active Date Inactive Code Status Order ID Comments User Context   12/22/2016 10:46 03/04/2017 15:22 DNR [id number]  [facility]   12/13/2016 02:34 12/13/2016 15:46 Full Code [id number]  Ivor Costa, MD ED    Questions for Most Recent Historical Code Status (Order [id number])    Question Answer Comment   In the event of cardiac or respiratory ARREST Do not call a "code blue"    In the event of cardiac or respiratory ARREST Do not perform Intubation, CPR, defibrillation or ACLS    In the event of cardiac or respiratory ARREST Use medication by any route, position, wound care, and other measures to relive pain and suffering. May use oxygen, suction and manual treatment of airway obstruction as needed for comfort.       Home/SNF/Other Home  Chief Complaint fall  Level of Care/Admitting Diagnosis ED Disposition    ED Disposition Condition Comment   Admit  Hospital Area: [facility] [[id number]]  Level of Care: Med-Surg [16]  Diagnosis: Compression fracture of body of thoracic vertebra [facility]) [[id number]]  Admitting Physician: Tawni Millers [[id number]]  Attending Physician: Tawni Millers [[id number]]  Estimated length of stay: 3 - 4 days  Certification:: I certify this patient will need inpatient services for at least 2 midnights  PT Class (Do Not Modify): Inpatient [101]  PT Acc Code (Do Not Modify): Private [1]       Medical History Past Medical History:  Diagnosis Date  . Cancer ([place]) 2002   skin, removed from face  . Dementia    mild  . Diabetes mellitus without complication ([place])   . HOH (hard of hearing)    does not wear hearing aides, but can hear you if you speak loudly  . Hypertension   . STEMI involving right coronary artery ([place]) 2008   S/P 2.5 x 20 mm Taxus DES RCA, med rx for 30% LAD, 80% OM1 & dCFX,  EF 60%    Allergies No Known Allergies  IV Location/Drains/Wounds Patient Lines/Drains/Airways Status   Active Line/Drains/Airways    Name:   Placement date:   Placement time:   Site:   Days:   Peripheral IV 05/17/17 Left Antecubital   05/17/17    1206    Antecubital   less than 1   Incision (Closed) 06/10/16 Eye Left   06/10/16    0755     341          Labs/Imaging Results for orders placed or performed during the hospital encounter of 05/17/17 (from the past 48 hour(s))  CBG monitoring, ED     Status: Abnormal   Collection Time: 05/17/17 10:46 AM  Result Value Ref Range   Glucose-Capillary 139 (H) 65 - 99 mg/dL  Urinalysis, Routine w reflex microscopic     Status: Abnormal   Collection Time: 05/17/17 11:59 AM  Result Value Ref Range   Color, Urine RED (A) YELLOW    Comment: BIOCHEMICALS MAY BE AFFECTED BY COLOR   APPearance CLOUDY (A) CLEAR   Specific Gravity, Urine 1.020 1.005 - 1.030   pH 5.0 5.0 - 8.0   Glucose, UA NEGATIVE NEGATIVE mg/dL   Hgb urine dipstick LARGE (A) NEGATIVE   Bilirubin Urine NEGATIVE NEGATIVE   Ketones, ur 5 (A) NEGATIVE mg/dL   Protein, ur 100 (A) NEGATIVE mg/dL   Nitrite NEGATIVE  NEGATIVE   Leukocytes, UA TRACE (A) NEGATIVE   RBC / HPF TOO NUMEROUS TO COUNT 0 - 5 RBC/hpf   WBC, UA TOO NUMEROUS TO COUNT 0 - 5 WBC/hpf   Bacteria, UA RARE (A) NONE SEEN   Squamous Epithelial / LPF 0-5 (A) NONE SEEN   Mucus PRESENT   CBC with Differential     Status: Abnormal   Collection Time: 05/17/17 12:04 PM  Result Value Ref Range   WBC 14.3 (H) 4.0 - 10.5 K/uL   RBC 4.48 4.22 - 5.81 MIL/uL   Hemoglobin 13.8 13.0 - 17.0 g/dL   HCT 41.2 39.0 - 52.0 %   MCV 92.0 78.0 - 100.0 fL   MCH 30.8 26.0 - 34.0 pg   MCHC 33.5 30.0 - 36.0 g/dL   RDW 13.3 11.5 - 15.5 %   Platelets 279 150 - 400 K/uL   Neutrophils Relative % 87 %   Neutro Abs 12.3 (H) 1.7 - 7.7 K/uL   Lymphocytes Relative 8 %   Lymphs Abs 1.1 0.7 - 4.0 K/uL   Monocytes Relative 5 %   Monocytes  Absolute 0.7 0.1 - 1.0 K/uL   Eosinophils Relative 0 %   Eosinophils Absolute 0.0 0.0 - 0.7 K/uL   Basophils Relative 0 %   Basophils Absolute 0.0 0.0 - 0.1 K/uL  Basic metabolic panel     Status: Abnormal   Collection Time: 05/17/17 12:04 PM  Result Value Ref Range   Sodium 135 135 - 145 mmol/L   Potassium 3.4 (L) 3.5 - 5.1 mmol/L   Chloride 100 (L) 101 - 111 mmol/L   CO2 26 22 - 32 mmol/L   Glucose, Bld 180 (H) 65 - 99 mg/dL   BUN 19 6 - 20 mg/dL   Creatinine, Ser 0.86 0.61 - 1.24 mg/dL   Calcium 8.7 (L) 8.9 - 10.3 mg/dL   GFR calc non Af Amer >60 >60 mL/min   GFR calc Af Amer >60 >60 mL/min    Comment: (NOTE) The eGFR has been calculated using the CKD EPI equation. This calculation has not been validated in all clinical situations. eGFR's persistently <60 mL/min signify possible Chronic Kidney Disease.    Anion gap 9 5 - 15  CK     Status: Abnormal   Collection Time: 05/17/17 12:04 PM  Result Value Ref Range   Total CK 3,259 (H) 49 - 397 U/L  Magnesium     Status: Abnormal   Collection Time: 05/17/17 12:04 PM  Result Value Ref Range   Magnesium 1.6 (L) 1.7 - 2.4 mg/dL   Dg Lumbar Spine Complete  Result Date: 05/17/2017 CLINICAL DATA:  Low back pain after falling today. Initial encounter. EXAM: LUMBAR SPINE - COMPLETE 4+ VIEW COMPARISON:  Abdominal radiographs 08/30/2015. Chest radiographs 12/12/2016 and 06/10/2015. FINDINGS: Five lumbar type vertebral bodies. The bones are demineralized. A superior endplate compression deformity at T12 is unchanged from the chest radiographs of 5 months ago. There is a possible minimal compression deformity or Schmorl's node involving the superior endplate of L2, age indeterminate. There is mild intervertebral spurring and facet hypertrophy throughout the lumbar spine. There is diffuse aortoiliac atherosclerosis. IMPRESSION: Age indeterminate minimal compression deformity or Schmorl's node involving the superior endplate of L2. T12 compression  deformity, stable from chest radiographs of 5 months ago. Spondylosis and aortic atherosclerosis. Electronically Signed   By: Richardean Sale M.D.   On: 05/17/2017 11:52    Pending Labs [employer] (From admission, onward)   Start  Ordered   05/17/17 1245  Urine culture  Add-on,   STAT     05/17/17 1244      Vitals/Pain Today's Vitals   05/17/17 0947 05/17/17 0948 05/17/17 1255 05/17/17 1449  BP: 121/76  109/64   Pulse: 91  85   Resp:   (!) 22   Temp: 97.8 F (36.6 C)     TempSrc: Oral     SpO2: 97%  98%   Weight:  160 lb (72.6 kg)    Height:  6' 1"  (1.854 m)    PainSc:  5   0-No pain    Isolation Precautions No active isolations  Medications Medications  0.9 %  sodium chloride infusion ( Intravenous New Bag/Given 05/17/17 1342)  sodium chloride 0.9 % bolus 500 mL (500 mLs Intravenous New Bag/Given 05/17/17 1207)  cefTRIAXone (ROCEPHIN) 1 g in dextrose 5 % 50 mL IVPB (0 g Intravenous Stopped 05/17/17 1327)  fentaNYL (SUBLIMAZE) injection 50 mcg (50 mcg Intravenous Given 05/17/17 1327)    Mobility non-ambulatory

## 2017-05-17 NOTE — Progress Notes (Signed)
05/17/17  1945  Patient states he lost one lens out of his glasses. Sophia and I looked inside patients bed we changed all linen and shook out linen and was not able to find lens.

## 2017-05-17 NOTE — Plan of Care (Signed)
  Nutrition: Adequate nutrition will be maintained 05/17/2017 1648 - Progressing by Dorene Sorrow, RN   Pain Managment: General experience of comfort will improve 05/17/2017 1648 - Progressing by Dorene Sorrow, RN   Safety: Ability to remain free from injury will improve 05/17/2017 1648 - Progressing by Dorene Sorrow, RN

## 2017-05-17 NOTE — ED Triage Notes (Signed)
EMS reports fall. Lying on floor since yesterday morning, c/o back pain, 5-10. Hx of diabetes, hypertension, hypercholestermia, lives alone with caregiver x 3 days a week, .No obvious deformity, hematoma or lacerations  BP 140/100 HR 100 sp02 95 RA CBG 186

## 2017-05-17 NOTE — H&P (Signed)
History and Physical    [name] Cook ZJI:[id number] DOB: 1927/01/02 DOA: 05/17/2017  PCP: Chesley Noon, MD   Patient coming from: Home  Chief Complaint: fall and ambulatory dysfunction.   HPI: [name] Cook is a 83 y.o. male with medical history significant of diabetes mellitus, hypertension and cognitive impairment. Presents after being found down by home health, apparently he felt the night before while trying to climb steps, denies any head trauma or loss of consciousness but apparently he was not able to stand back on his feet. Currently he does have pain on his lower back which is mild to moderate intensity, worse with movement, no improving factors, no radiation. Denies any fevers or chills.  He has been noticed to have ambulatory dysfunction he does have a cane that apparently does not use on a regular basis. Most information obtained from his family at bedside.   ED Course: Patient was workup with x-rays of his lumbar spine showing a compression fracture on L2. Considering his ambulatory dysfunction, risk of requiring falls, and complications associated with this acute fracture, he was referred for admission for evaluation  Review of Systems:  1. General: No fevers, no chills, no weight gain or weight loss 2. ENT: No runny nose or sore throat, no hearing disturbances 3. Pulmonary: No dyspnea, cough, wheezing, or hemoptysis 4. Cardiovascular: No angina, claudication, lower extremity edema, pnd or orthopnea 5. Gastrointestinal: No nausea or vomiting, no diarrhea or constipation 6. Hematology: No easy bruisability or frequent infections 7. Urology: No dysuria, hematuria or increased urinary frequency 8. Dermatology: No rashes. 9. Neurology: No seizures or paresthesias 10. Musculoskeletal: Positive low back pain as mentioned in history present illness  Past Medical History:  Diagnosis Date  . Cancer ([place]) 2002   skin, removed from face  . Dementia    mild  . Diabetes  mellitus without complication ([place])   . HOH (hard of hearing)    does not wear hearing aides, but can hear you if you speak loudly  . Hypertension   . STEMI involving right coronary artery ([place]) 2008   S/P 2.5 x 20 mm Taxus DES RCA, med rx for 30% LAD, 80% OM1 & dCFX, EF 60%    Past Surgical History:  Procedure Laterality Date  . APPENDECTOMY    . CATARACT EXTRACTION PHACO AND INTRAOCULAR LENS PLACEMENT (IOC) Left 06/10/2016   Performed by Estill Cotta, MD at [facility] ORS  . CHOLECYSTECTOMY    . CORONARY ANGIOPLASTY WITH STENT PLACEMENT  2008   2.5 x 20 mm Taxus DES to 95% RCA, 80% dCFX, 80% OM1, 30% LAD > med rx, EF 60%  . HERNIA REPAIR     bilateral inguinal hernia     reports that he quit smoking about 48 years ago. he has never used smokeless tobacco. He reports that he does not drink alcohol or use drugs.  No Known Allergies  History reviewed. No pertinent family history.  Prior to Admission medications   Medication Sig Start Date End Date Taking? Authorizing Provider  aspirin EC 81 MG tablet Take 1 tablet (81 mg total) by mouth daily. 12/22/16  Yes Barrett, Evelene Croon, PA-C  glipiZIDE (GLUCOTROL XL) 10 MG 24 hr tablet Take 10 mg daily with breakfast by mouth.   Yes [provider]  metoprolol tartrate (LOPRESSOR) 50 MG tablet Take 1 tablet (50 mg total) by mouth 2 (two) times daily. Patient taking differently: Take 50 mg daily by mouth.  12/22/16  Yes Barrett, Suanne Marker  G, PA-C  rosuvastatin (CRESTOR) 20 MG tablet Take 20 mg daily by mouth.   Yes [provider]  atorvastatin (LIPITOR) 40 MG tablet Take 1 tablet (40 mg total) by mouth daily. Patient not taking: Reported on 05/17/2017 01/05/17   Barrett, Evelene Croon, PA-C  cephALEXin (KEFLEX) 500 MG capsule Take 1 capsule (500 mg total) by mouth 3 (three) times daily. Patient not taking: Reported on 05/17/2017 03/06/17   Drenda Freeze, MD  nitroGLYCERIN (NITROSTAT) 0.4 MG SL tablet Place 1 tablet (0.4 mg total)  under the tongue every 5 (five) minutes as needed for chest pain. 12/22/16 03/22/17  Lonn Georgia, PA-C    Physical Exam: Vitals:   05/17/17 0947 05/17/17 0948 05/17/17 1255  BP: 121/76  109/64  Pulse: 91  85  Resp:   (!) 22  Temp: 97.8 F (36.6 C)    TempSrc: Oral    SpO2: 97%  98%  Weight:  72.6 kg (160 lb)   Height:  6\' 1"  (1.854 m)     Constitutional: deconditioned and ill looking appearing Vitals:   05/17/17 0947 05/17/17 0948 05/17/17 1255  BP: 121/76  109/64  Pulse: 91  85  Resp:   (!) 22  Temp: 97.8 F (36.6 C)    TempSrc: Oral    SpO2: 97%  98%  Weight:  72.6 kg (160 lb)   Height:  6\' 1"  (1.854 m)    Eyes: PERRL, lids and conjunctivae pale, no icterus.  Head normocephalic, nose and ears with no deformities.  ENMT: Mucous membranes are dry. Posterior pharynx clear of any exudate or lesions.Normal dentition.  Neck: normal, supple, no masses, no thyromegaly Respiratory: decreased breath auscultation bilaterally at bases, no wheezing, no crackles. Normal respiratory effort. No accessory muscle use.  Cardiovascular: Regular rate and rhythm, no murmurs / rubs / gallops. No extremity edema. 2+ pedal pulses. No carotid bruits.  Abdomen: no tenderness, no masses palpated. No hepatosplenomegaly. Bowel sounds positive.  Musculoskeletal: no clubbing / cyanosis. No joint deformity upper and lower extremities. Good ROM, no contractures. Normal muscle tone.  Skin: no rashes, lesions, ulcers. No induration Neurologic: CN 2-12 grossly intact. Sensation intact, DTR normal. Strength 5/5 in all 4.     Labs on Admission: I have personally reviewed following labs and imaging studies  CBC: Recent Labs  Lab 05/17/17 1204  WBC 14.3*  NEUTROABS 12.3*  HGB 13.8  HCT 41.2  MCV 92.0  PLT 161   Basic Metabolic Panel: Recent Labs  Lab 05/17/17 1204  NA 135  K 3.4*  CL 100*  CO2 26  GLUCOSE 180*  BUN 19  CREATININE 0.86  CALCIUM 8.7*  MG 1.6*   GFR: Estimated  Creatinine Clearance: 61 mL/min (by C-G formula based on SCr of 0.86 mg/dL). Liver Function Tests: No results for input(s): AST, ALT, ALKPHOS, BILITOT, PROT, ALBUMIN in the last 168 hours. No results for input(s): LIPASE, AMYLASE in the last 168 hours. No results for input(s): AMMONIA in the last 168 hours. Coagulation Profile: No results for input(s): INR, PROTIME in the last 168 hours. Cardiac Enzymes: Recent Labs  Lab 05/17/17 1204  CKTOTAL 3,259*   BNP (last 3 results) No results for input(s): PROBNP in the last 8760 hours. HbA1C: No results for input(s): HGBA1C in the last 72 hours. CBG: Recent Labs  Lab 05/17/17 1046  GLUCAP 139*   Lipid Profile: No results for input(s): CHOL, HDL, LDLCALC, TRIG, CHOLHDL, LDLDIRECT in the last 72 hours. Thyroid Function Tests: No  results for input(s): TSH, T4TOTAL, FREET4, T3FREE, THYROIDAB in the last 72 hours. Anemia Panel: No results for input(s): VITAMINB12, FOLATE, FERRITIN, TIBC, IRON, RETICCTPCT in the last 72 hours. Urine analysis:    Component Value Date/Time   COLORURINE RED (A) 05/17/2017 1159   APPEARANCEUR CLOUDY (A) 05/17/2017 1159   LABSPEC 1.020 05/17/2017 1159   PHURINE 5.0 05/17/2017 1159   GLUCOSEU NEGATIVE 05/17/2017 1159   HGBUR LARGE (A) 05/17/2017 1159   BILIRUBINUR NEGATIVE 05/17/2017 1159   KETONESUR 5 (A) 05/17/2017 1159   PROTEINUR 100 (A) 05/17/2017 1159   NITRITE NEGATIVE 05/17/2017 1159   LEUKOCYTESUR TRACE (A) 05/17/2017 1159    Radiological Exams on Admission: Dg Lumbar Spine Complete  Result Date: 05/17/2017 CLINICAL DATA:  Low back pain after falling today. Initial encounter. EXAM: LUMBAR SPINE - COMPLETE 4+ VIEW COMPARISON:  Abdominal radiographs 08/30/2015. Chest radiographs 12/12/2016 and 06/10/2015. FINDINGS: Five lumbar type vertebral bodies. The bones are demineralized. A superior endplate compression deformity at T12 is unchanged from the chest radiographs of 5 months ago. There is a  possible minimal compression deformity or Schmorl's node involving the superior endplate of L2, age indeterminate. There is mild intervertebral spurring and facet hypertrophy throughout the lumbar spine. There is diffuse aortoiliac atherosclerosis. IMPRESSION: Age indeterminate minimal compression deformity or Schmorl's node involving the superior endplate of L2. T12 compression deformity, stable from chest radiographs of 5 months ago. Spondylosis and aortic atherosclerosis. Electronically Signed   By: Richardean Sale M.D.   On: 05/17/2017 11:52    EKG: Independently reviewed. NA  Assessment/Plan Active Problems:   Compression fracture of body of thoracic vertebra [facility])  83 year old male who presented after sustaining a mechanical fall and posteriorly worsening upper dysfunction. On initial physical examination his blood pressures 120/94, temperature 98.7, heart rate 98, respiratory 20, oxygen saturation 96% on room air, dry mucous membranes, lungs clear to auscultation bilaterally, heart S1-S2 present rhythmic, abdomen soft nontender, no lower extremity edema. Sodium 135, potassium 3.4, chloride 100, bicarbonate 26, glucose 180, BUN 19, creatinine 0.86, CPK 3259, white count 14.3, hemoglobin 13.8, hematocrit 41.2, platelets 279. Urinalysis with too numerous count white cells. Lumbar x-ray with indeterminate minimal compression deformity or Schmorl's node involving superior endplate of L2. T12 compression deformity stable from chest radiograph 5 months ago.   Working diagnosis, acute worsening of her dysfunction due to suspected L2 compression fracture complicated by urinary tract infection, impending rhabdomyolysis.  1. Acute ambulatory dysfunction due to suspected L2 compression fracture. Will admit patient to medical ward, continue pain control with IV morphine, physical therapy as tolerated. Will need to assess patient's safety before returning home.  2. Urinary tract infection, present on  admission. Will give IV antibiotic therapy with ceftriaxone, follow up on cultures, cell count and temperature curve. Will order ultrasonography of the kidneys as further workup, rule out obstructive uropathy.   3. Impending rhabdomyolysis. CPK 3295, will continue hydration with IV fluids, renal function is preserved per serum creatinine, follow-up kidney function morning. Avoid hypotension and nephrotoxic agents.  4. Type 2 diabetes mellitus. Will hold on oral hypoglycemic agents, continue insulin sliding scale for glucose coverage and monitoring.  5. Hypertension. Continue metoprolol tartrate, per his home regimen. Continue IV fluids for hydration.   DVT prophylaxis: enoxaparin Code Status: dnr  Family Communication: I spoke with patient's family at bedside and all questions were addressed. Disposition Plan: snf  Consults called: none  Admission status: Inpatient.     Mauricio Gerome Apley MD [facility] Pager [phone]  If 7PM-7AM, please contact night-coverage [email or web address] Password [email or web address]  05/17/2017, 2:48 PM

## 2017-05-18 ENCOUNTER — Inpatient Hospital Stay (HOSPITAL_COMMUNITY): Payer: Medicare Other

## 2017-05-18 LAB — COMPREHENSIVE METABOLIC PANEL
ALBUMIN: 2.8 g/dL — AB (ref 3.5–5.0)
ALT: 28 U/L (ref 17–63)
AST: 56 U/L — AB (ref 15–41)
Alkaline Phosphatase: 75 U/L (ref 38–126)
Anion gap: 8 (ref 5–15)
BUN: 21 mg/dL — AB (ref 6–20)
CHLORIDE: 106 mmol/L (ref 101–111)
CO2: 23 mmol/L (ref 22–32)
CREATININE: 0.83 mg/dL (ref 0.61–1.24)
Calcium: 8.1 mg/dL — ABNORMAL LOW (ref 8.9–10.3)
GFR calc non Af Amer: >60 mL/min (ref >60)
GLUCOSE: 143 mg/dL — AB (ref 65–99)
Potassium: 3.7 mmol/L (ref 3.5–5.1)
SODIUM: 137 mmol/L (ref 135–145)
Total Bilirubin: 1.7 mg/dL — ABNORMAL HIGH (ref 0.3–1.2)
Total Protein: 5.7 g/dL — ABNORMAL LOW (ref 6.5–8.1)

## 2017-05-18 LAB — GLUCOSE, CAPILLARY
GLUCOSE-CAPILLARY: 144 mg/dL — AB (ref 65–99)
GLUCOSE-CAPILLARY: 128 mg/dL — AB (ref 65–99)
Glucose-Capillary: 143 mg/dL — ABNORMAL HIGH (ref 65–99)
Glucose-Capillary: 155 mg/dL — ABNORMAL HIGH (ref 65–99)

## 2017-05-18 LAB — CBC
HCT: 36.5 % — ABNORMAL LOW (ref 39.0–52.0)
Hemoglobin: 11.9 g/dL — ABNORMAL LOW (ref 13.0–17.0)
MCH: 30.4 pg (ref 26.0–34.0)
MCHC: 32.6 g/dL (ref 30.0–36.0)
MCV: 93.4 fL (ref 78.0–100.0)
PLATELETS: 230 10*3/uL (ref 150–400)
RBC: 3.91 MIL/uL — AB (ref 4.22–5.81)
RDW: 13.6 % (ref 11.5–15.5)
WBC: 10 10*3/uL (ref 4.0–10.5)

## 2017-05-18 LAB — CK: Total CK: 913 U/L — ABNORMAL HIGH (ref 49–397)

## 2017-05-18 MED ORDER — ACETAMINOPHEN 500 MG PO TABS
1000.0000 mg | ORAL_TABLET | Freq: Three times a day (TID) | ORAL | Status: DC
  Administered 2017-05-18 – 2017-05-20 (×5): 1000 mg via ORAL
  Filled 2017-05-18 (×4): qty 2

## 2017-05-18 MED ORDER — OXYCODONE HCL 5 MG PO TABS
2.5000 mg | ORAL_TABLET | Freq: Four times a day (QID) | ORAL | Status: DC | PRN
  Administered 2017-05-19 – 2017-05-20 (×2): 2.5 mg via ORAL
  Filled 2017-05-18 (×2): qty 1

## 2017-05-18 MED ORDER — MORPHINE SULFATE (PF) 2 MG/ML IV SOLN: 1.0000 mg | INTRAVENOUS | Status: DC | PRN

## 2017-05-18 NOTE — Clinical Social Work Note (Addendum)
Clinical Social Work Assessment  Patient Details  Name: [name] Cook MRN: [id number] Date of Birth: 1927/03/25  Date of referral:  05/18/17               Reason for consult:  Facility Placement                Permission sought to share information with:  Family Supports, Chartered certified accountant granted to share information::  Yes, Verbal Permission Granted  Name::        Agency::  SNF  Relationship::  Daughter/Son in Financial trader Information:     Housing/Transportation Living arrangements for the past 2 months:  Single Family Home Source of Information:  Adult Children Patient Interpreter Needed:  None Criminal Activity/Legal Involvement Pertinent to Current Situation/Hospitalization:  No - Comment as needed Significant Relationships:  Adult Children Lives with:  Self Do you feel safe going back to the place where you live?  Yes Need for family participation in patient care:  Yes (Dependent with mobility, confused at times)  Care giving concerns:  Patient admitted for fall and ambulatory dysfunction Patient was found down by home health staff while trying to climb stairs.Patient has a cane but does not use it on a regular basis. The patient daughter and son in law are concern about patient ability to care for himself and live alone. She reports the patient is very independent but will not allow for others to help him. 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 She is concern about the patient is not remembering to take his medications.  She states his home conditions are not sanitary and he unable to properly care his cats.   Patient family would prefer to see the patient living in a facility where he is able to receive 24 hour care and supervision. The patient is not agreeable to this.   She states the patient receives a monthly pension and does not qualify for medicaid to help cover cost and has made it difficult to find  a place.  Patient family has contacted Adult Scientist, forensic for guidance for possible Legal Guardianship.  Family thinks the patient will benefit from rehab at facility before returning home.   Social Worker assessment / plan:  CSW met with patient at bedside, explain role and reason for visit- assist with SNF placement.Patient gave CSW permission to talk with daughter and son in law.   CSW listened attentively and provided support. Patient daughter and son in law expressed concerns about the patient ability to care for self. Physical Therapy evaluated  the patient and recommends SNF.  CSW explained SNF process. Patient will need 3 night stay inpatient to qualify for medicare to pay for the  rehab stay. If patient does not meet 3 night stay, pt.will go home w/Home Health.   FL2 complete. Submitted for PASRR.  Plan: Assist w/ SNF placement.    Employment status:  Retired Forensic scientist:  Medicare PT Recommendations:   [place] / Referral to community resources:  [place]  Patient/Family's Response to care:  Patient is agreeable to SNF placement but is concern about having to "stay for several nights" at [facility].   Patient/Family's Understanding of and Emotional Response to Diagnosis, Current Treatment, and Prognosis: Patient confused at times.  Patient has referred CSW to talk with daughter.   Emotional Assessment Appearance:  Appears stated age Attitude/Demeanor/Rapport:    Affect (typically observed):  Calm Orientation:  Oriented to Self, Oriented to Place, Oriented to Situation Alcohol / Substance use:  Not Applicable Psych involvement (Current and /or in the community):  No (Comment)  Discharge Needs  Concerns to be addressed:  Discharge Planning Concerns Readmission within the last 30 days:  Yes Current discharge risk:  Dependent with Mobility Barriers to Discharge:  Continued Medical Work up   [employer],  LCSW 05/18/2017, 12:35 PM

## 2017-05-18 NOTE — Progress Notes (Signed)
PROGRESS NOTE    [name] Cook  ZOX:[id number] DOB: 1927/01/19 DOA: 05/17/2017 PCP: Chesley Noon, MD   Brief Narrative:  [name] Cook is a 83 y.o. male with medical history significant of diabetes mellitus, hypertension and cognitive impairment. Presents after being found down by home health, apparently he felt the night before while trying to climb steps, denies any head trauma or loss of consciousness but apparently he was not able to stand back on his feet. Currently he does have pain on his lower back which is mild to moderate intensity, worse with movement, no improving factors, no radiation. Denies any fevers or chills.  He has been noticed to have ambulatory dysfunction he does have a cane that apparently does not use on a regular basis. Most information obtained from his family at bedside.  Assessment & Plan:   Active Problems:   Compression fracture of body of thoracic vertebra (HCC)  1. Acute ambulatory dysfunction due to suspected L2 compression fracture. Sounds like mechanical fall walking up steps.  He reports his hands slipped on banister.  Lumbar film notes age indeterminate minimal compression deformity or schmorl's node of superior endplate of L2 and stable T12 compression deformity.   Pain control with scheduled APAP, oxycodone, IV morphine prn PT/OT Check vitamin D level  2. Urinary tract infection, present on admission.  Continue ceftriaxone Follow up urine cx and sensitivities Follow up renal US   3. Impending rhabdomyolysis. CPK 3295, improved today to 900's. Hold statin for now.  4. Type 2 diabetes mellitus. Will hold on oral hypoglycemic agents, continue insulin sliding scale for glucose coverage and monitoring.  5. Hypertension. Continue metoprolol tartrate, per his home regimen. Continue IV fluids for hydration.    DVT prophylaxis: lovenox  Code Status: DNR/DNI Family Communication: daughter, son in law on phone Disposition Plan: pending  improvement, likely snf   Consultants:   none  Procedures: (Don't include imaging studies which can be auto populated. Include things that cannot be auto populated i.e. Echo, Carotid and venous dopplers, Foley, Bipap, HD, tubes/drains, wound vac, central lines etc)  Renal US pending  Antimicrobials: (specify start and planned stop date. Auto populated tables are space occupying and do not give end dates) Anti-infectives (From admission, onward)   Start     Dose/Rate Route Frequency Ordered Stop   05/18/17 1200  cefTRIAXone (ROCEPHIN) 1 g in dextrose 5 % 50 mL IVPB     1 g 100 mL/hr over 30 Minutes Intravenous Every 24 hours 05/17/17 1830     05/17/17 1245  cefTRIAXone (ROCEPHIN) 1 g in dextrose 5 % 50 mL IVPB     1 g 100 mL/hr over 30 Minutes Intravenous  Once 05/17/17 1243 05/17/17 1327        Subjective: Feels ok except pain when moves in back. [place] backwards when he was walking up steps day before yesterday.  No numbness, tingling, weakness..  No bowel/bladder problems. Fell 2x in past year.  No frequent falls.    Objective: Vitals:   05/17/17 1727 05/17/17 2208 05/18/17 0544 05/18/17 0834  BP: 130/82 119/62 (!) 111/54 (!) 116/47  Pulse: 97 80 82 78  Resp:  18 18 (!) 22  Temp:  97.8 F (36.6 C) 99.5 F (37.5 C) 98.7 F (37.1 C)  TempSrc:  Oral Oral Oral  SpO2:  95% 94% 93%  Weight:      Height:        Intake/Output Summary (Last 24 hours) at 05/18/2017 0908 Last  data filed at 05/18/2017 0544 Gross per 24 hour  Intake 1923.33 ml  Output 150 ml  Net 1773.33 ml   Filed Weights   05/17/17 0948 05/17/17 1626  Weight: 72.6 kg (160 lb) 70.6 kg (155 lb 10.3 oz)    Examination:  General exam: Appears calm and comfortable  Respiratory system: Clear to auscultation. Respiratory effort normal. Cardiovascular system: S1 & S2 heard, RRR. No JVD, murmurs, rubs, gallops or clicks. No pedal edema. Gastrointestinal system: Abdomen is nondistended, soft and nontender.  No organomegaly or masses felt. Normal bowel sounds heard. Central nervous system: Alert and oriented. No focal neurological deficits.   Extremities: Symmetric 5 x 5 power. Skin: No rashes, lesions or ulcers Psychiatry: Judgement and insight appear normal. Mood & affect appropriate.     Data Reviewed: I have personally reviewed following labs and imaging studies  CBC: Recent Labs  Lab 05/17/17 1204 05/18/17 0529  WBC 14.3* 10.0  NEUTROABS 12.3*  --   HGB 13.8 11.9*  HCT 41.2 36.5*  MCV 92.0 93.4  PLT 279 518   Basic Metabolic Panel: Recent Labs  Lab 05/17/17 1204 05/18/17 0529  NA 135 137  K 3.4* 3.7  CL 100* 106  CO2 26 23  GLUCOSE 180* 143*  BUN 19 21*  CREATININE 0.86 0.83  CALCIUM 8.7* 8.1*  MG 1.6*  --    GFR: Estimated Creatinine Clearance: 61.4 mL/min (by C-G formula based on SCr of 0.83 mg/dL). Liver Function Tests: Recent Labs  Lab 05/18/17 0529  AST 56*  ALT 28  ALKPHOS 75  BILITOT 1.7*  PROT 5.7*  ALBUMIN 2.8*   No results for input(s): LIPASE, AMYLASE in the last 168 hours. No results for input(s): AMMONIA in the last 168 hours. Coagulation Profile: No results for input(s): INR, PROTIME in the last 168 hours. Cardiac Enzymes: Recent Labs  Lab 05/17/17 1204 05/18/17 0529  CKTOTAL 3,259* 913*   BNP (last 3 results) No results for input(s): PROBNP in the last 8760 hours. HbA1C: No results for input(s): HGBA1C in the last 72 hours. CBG: Recent Labs  Lab 05/17/17 1046 05/17/17 1721 05/17/17 2203 05/18/17 0750  GLUCAP 139* 140* 140* 143*   Lipid Profile: No results for input(s): CHOL, HDL, LDLCALC, TRIG, CHOLHDL, LDLDIRECT in the last 72 hours. Thyroid Function Tests: No results for input(s): TSH, T4TOTAL, FREET4, T3FREE, THYROIDAB in the last 72 hours. Anemia Panel: No results for input(s): VITAMINB12, FOLATE, FERRITIN, TIBC, IRON, RETICCTPCT in the last 72 hours. Sepsis Labs: No results for input(s): PROCALCITON, LATICACIDVEN in  the last 168 hours.  No results found for this or any previous visit (from the past 240 hour(s)).       Radiology Studies: Dg Lumbar Spine Complete  Result Date: 05/17/2017 CLINICAL DATA:  Low back pain after falling today. Initial encounter. EXAM: LUMBAR SPINE - COMPLETE 4+ VIEW COMPARISON:  Abdominal radiographs 08/30/2015. Chest radiographs 12/12/2016 and 06/10/2015. FINDINGS: Five lumbar type vertebral bodies. The bones are demineralized. A superior endplate compression deformity at T12 is unchanged from the chest radiographs of 5 months ago. There is a possible minimal compression deformity or Schmorl's node involving the superior endplate of L2, age indeterminate. There is mild intervertebral spurring and facet hypertrophy throughout the lumbar spine. There is diffuse aortoiliac atherosclerosis. IMPRESSION: Age indeterminate minimal compression deformity or Schmorl's node involving the superior endplate of L2. T12 compression deformity, stable from chest radiographs of 5 months ago. Spondylosis and aortic atherosclerosis. Electronically Signed   By: Richardean Sale  M.D.   On: 05/17/2017 11:52        Scheduled Meds: . aspirin EC  81 mg Oral Daily  . enoxaparin (LOVENOX) injection  40 mg Subcutaneous Q24H  . insulin aspart  0-9 Units Subcutaneous TID WC  . metoprolol tartrate  50 mg Oral Daily  . rosuvastatin  20 mg Oral Daily  . sodium chloride flush  3 mL Intravenous Q12H   Continuous Infusions: . sodium chloride 100 mL/hr at 05/17/17 1923  . cefTRIAXone (ROCEPHIN)  IV       LOS: 1 day    Time spent: over 30 minutes    Fayrene Helper, MD [facility] Pager [phone]   If 7PM-7AM, please contact night-coverage [email or web address] Password [email or web address] 05/18/2017, 9:08 AM

## 2017-05-18 NOTE — Evaluation (Signed)
Physical Therapy Evaluation Patient Details Name: [name] Cook MRN: [id number] DOB: 12/15/1926 Today's Date: 05/18/2017   History of Present Illness  [name] Cook is a 83 y.o. male with medical history significant of diabetes mellitus, hypertension and cognitive impairment. Presents after being found down by home health, apparently he fell the night before while trying to climb steps.  Xray of spine, age indeterminent compression fx T 12 compression fx of no change.  Clinical Impression  The patient ambulated 100' x 2 with RW. No family present. Patient reports home alone. Patient currently requires assistance for mobility. Pt admitted with above diagnosis. Pt currently with functional limitations due to the deficits listed below (see PT Problem List).  Pt will benefit from skilled PT to increase their independence and safety with mobility to allow discharge to the venue listed below.       Follow Up Recommendations SNF    Equipment Recommendations  None recommended by PT    Recommendations for Other Services       Precautions / Restrictions Precautions Precautions: Fall Restrictions Weight Bearing Restrictions: No      Mobility  Bed Mobility Overal bed mobility: Needs Assistance             General bed mobility comments: in recliner  Transfers Overall transfer level: Needs assistance Equipment used: Rolling walker (2 wheeled) Transfers: Sit to/from Stand Sit to Stand: Mod assist         General transfer comment: steady assist to rise from recliner, cues for hand placement  Ambulation/Gait Ambulation/Gait assistance: Min assist;Mod assist Ambulation Distance (Feet): 100 Feet Assistive device: Rolling walker (2 wheeled) Gait Pattern/deviations: Step-to pattern;Step-through pattern;Shuffle;Trunk flexed     General Gait Details: steady assist for truning, moving in tight areas. stop x 3 for rest break standing.  Stairs            Wheelchair Mobility    Modified Rankin (Stroke Patients Only)       Balance Overall balance assessment: Needs assistance;History of Falls Sitting-balance support: Feet supported;No upper extremity supported Sitting balance-Leahy Scale: Fair Sitting balance - Comments: some tendency to lean posterior, trunk is rigid to lean forward.   Standing balance support: During functional activity;Bilateral upper extremity supported Standing balance-Leahy Scale: Poor Standing balance comment: tendency for posterior lean                             Pertinent Vitals/Pain Pain Assessment: No/denies pain    Home Living Family/patient expects to be discharged to:: Unsure Living Arrangements: Alone   Type of Home: House Home Access: Stairs to enter   [employer] of Steps: 3 Home Layout: One level Home Equipment: Walker - 2 wheels Additional Comments: patient reports that he has someone come in a to assist MWF.    Prior Function Level of Independence: Needs assistance   Gait / Transfers Assistance Needed: does not use AD in house           Hand Dominance        Extremity/Trunk Assessment   Upper Extremity Assessment Upper Extremity Assessment: Generalized weakness    Lower Extremity Assessment Lower Extremity Assessment: Generalized weakness    Cervical / Trunk Assessment Cervical / Trunk Assessment: Kyphotic  Communication   Communication: No difficulties  Cognition Arousal/Alertness: Awake/alert Behavior During Therapy: WFL for tasks assessed/performed Overall Cognitive Status: Within Functional Limits for tasks assessed  General Comments      Exercises     Assessment/Plan    PT Assessment Patient needs continued PT services  PT Problem List Decreased strength;Decreased activity tolerance;Decreased range of motion;Decreased balance;Decreased mobility;Decreased knowledge of precautions;Decreased safety  awareness;Decreased knowledge of use of DME       PT Treatment Interventions DME instruction;Gait training;Functional mobility training;Therapeutic activities;Therapeutic exercise;Patient/family education    PT Goals (Current goals can be found in the Care Plan section)  Acute Rehab PT Goals Patient Stated Goal: agrees to walk, wants to return home PT Goal Formulation: With patient Time For Goal Achievement: 06/01/17 Potential to Achieve Goals: Fair    Frequency Min 2X/week   Barriers to discharge Decreased caregiver support      Co-evaluation               AM-PAC PT "6 Clicks" Daily Activity  Outcome Measure Difficulty turning over in bed (including adjusting bedclothes, sheets and blankets)?: Unable Difficulty moving from lying on back to sitting on the side of the bed? : Unable Difficulty sitting down on and standing up from a chair with arms (e.g., wheelchair, bedside commode, etc,.)?: Unable Help needed moving to and from a bed to chair (including a wheelchair)?: Total Help needed walking in hospital room?: Total Help needed climbing 3-5 steps with a railing? : Total 6 Click Score: 6    End of Session Equipment Utilized During Treatment: Gait belt Activity Tolerance: Patient tolerated treatment well Patient left: in chair;with call bell/phone within reach;with chair alarm set Nurse Communication: Mobility status PT Visit Diagnosis: Unsteadiness on feet (R26.81);History of falling (Z91.81)    Time: 8299-3716 PT Time Calculation (min) (ACUTE ONLY): 18 min   Charges:   PT Evaluation $PT Eval Low Complexity: 1 Low     PT G CodesTresa Endo PT [phone]  Claretha Cooper 05/18/2017, 11:34 AM

## 2017-05-18 NOTE — NC FL2 (Signed)
[place] LEVEL OF CARE SCREENING TOOL     IDENTIFICATION  Patient Name: [name] Cook Birthdate: 1927-02-22 Sex: male Admission Date (Current Location): 05/17/2017  [facility] and [place] Number:  Herbalist and Address:  [facility],  [place], [place]      Provider Number: [id number]  Attending Physician Name and Address:  Elodia Florence., *  Relative Name and Phone Number:       Current Level of Care: Hospital Recommended Level of Care: [place] Prior Approval Number:    Date Approved/Denied:   PASRR Number: [id number]  Discharge Plan: SNF    Current Diagnoses: Patient Active Problem List   Diagnosis Date Noted  . Compression fracture of body of thoracic vertebra ([place]) 05/17/2017  . Chest pain 12/13/2016  . Diabetes mellitus without complication ([place])   . Coronary artery disease   . Hypertension     Orientation RESPIRATION BLADDER Height & Weight     Self, Time, Situation  Normal Continent Weight: 155 lb 10.3 oz (70.6 kg) Height:  6\' 1"  (185.4 cm)  BEHAVIORAL SYMPTOMS/MOOD NEUROLOGICAL BOWEL NUTRITION STATUS      Continent Diet(Heart Healthy)  AMBULATORY STATUS COMMUNICATION OF NEEDS Skin   Extensive Assist Verbally Normal                       Personal Care Assistance Level of Assistance  Bathing, Feeding, Dressing Bathing Assistance: Limited assistance Feeding assistance: Independent Dressing Assistance: Limited assistance     Functional Limitations Info  Sight, Hearing, Speech Sight Info: Impaired Hearing Info: Impaired Speech Info: Adequate    SPECIAL CARE FACTORS FREQUENCY  PT (By licensed PT), OT (By licensed OT)     PT Frequency: 5X/week OT Frequency: 5x/week            Contractures Contractures Info: Not present    Additional Factors Info  Code Status, Allergies, Insulin Sliding Scale Code Status Info: DNR Allergies Info: No Known Allergies    Insulin Sliding Scale Info: Dose: 0-9 Units/ 3x's Daily        Current Medications (05/18/2017):  This is the current hospital active medication list Current Facility-Administered Medications  Medication Dose Route Frequency Provider Last Rate Last Dose  . 0.9 %  sodium chloride infusion   Intravenous Continuous Arrien, Jimmy Picket, MD 100 mL/hr at 05/18/17 1334    . acetaminophen (TYLENOL) tablet 650 mg  650 mg Oral Q6H PRN Arrien, Jimmy Picket, MD       Or  . acetaminophen (TYLENOL) suppository 650 mg  650 mg Rectal Q6H PRN Arrien, Jimmy Picket, MD      . aspirin EC tablet 81 mg  81 mg Oral Daily Tawni Millers, MD   81 mg at 05/18/17 1016  . cefTRIAXone (ROCEPHIN) 1 g in dextrose 5 % 50 mL IVPB  1 g Intravenous Q24H Arrien, Jimmy Picket, MD   Stopped at 05/18/17 1323  . enoxaparin (LOVENOX) injection 40 mg  40 mg Subcutaneous Q24H Arrien, Jimmy Picket, MD   40 mg at 05/17/17 1753  . insulin aspart (novoLOG) injection 0-9 Units  0-9 Units Subcutaneous TID WC Arrien, Jimmy Picket, MD   2 Units at 05/18/17 1203  . metoprolol tartrate (LOPRESSOR) tablet 50 mg  50 mg Oral Daily Arrien, Jimmy Picket, MD   50 mg at 05/18/17 1012  . morphine 2 MG/ML injection 1 mg  1 mg Intravenous Q2H PRN Arrien, Jimmy Picket,  MD      . nitroGLYCERIN (NITROSTAT) SL tablet 0.4 mg  0.4 mg Sublingual Q5 min PRN Arrien, Jimmy Picket, MD      . ondansetron [facility]) tablet 4 mg  4 mg Oral Q6H PRN Arrien, Jimmy Picket, MD       Or  . ondansetron [facility]) injection 4 mg  4 mg Intravenous Q6H PRN Arrien, Jimmy Picket, MD      . rosuvastatin (CRESTOR) tablet 20 mg  20 mg Oral Daily Arrien, Jimmy Picket, MD   20 mg at 05/18/17 1016  . sodium chloride flush (NS) 0.9 % injection 3 mL  3 mL Intravenous Q12H Arrien, Jimmy Picket, MD         Discharge Medications: Please see discharge summary for a list of discharge medications.  Relevant Imaging Results:  Relevant Lab  Results:   Additional Information [id number]  Juanell Saffo A Tenae Graziosi, LCSW

## 2017-05-19 DIAGNOSIS — S22000A Wedge compression fracture of unspecified thoracic vertebra, initial encounter for closed fracture: Secondary | ICD-10-CM

## 2017-05-19 DIAGNOSIS — R319 Hematuria, unspecified: Secondary | ICD-10-CM

## 2017-05-19 DIAGNOSIS — N390 Urinary tract infection, site not specified: Secondary | ICD-10-CM

## 2017-05-19 DIAGNOSIS — R748 Abnormal levels of other serum enzymes: Secondary | ICD-10-CM

## 2017-05-19 DIAGNOSIS — R5381 Other malaise: Secondary | ICD-10-CM

## 2017-05-19 LAB — BASIC METABOLIC PANEL
Anion gap: 5 (ref 5–15)
BUN: 20 mg/dL (ref 6–20)
CHLORIDE: 107 mmol/L (ref 101–111)
CO2: 23 mmol/L (ref 22–32)
CREATININE: 0.76 mg/dL (ref 0.61–1.24)
Calcium: 7.9 mg/dL — ABNORMAL LOW (ref 8.9–10.3)
GFR calc Af Amer: >60 mL/min (ref >60)
GFR calc non Af Amer: >60 mL/min (ref >60)
GLUCOSE: 134 mg/dL — AB (ref 65–99)
Potassium: 3.5 mmol/L (ref 3.5–5.1)
Sodium: 135 mmol/L (ref 135–145)

## 2017-05-19 LAB — CBC
HEMATOCRIT: 34.4 % — AB (ref 39.0–52.0)
HEMOGLOBIN: 11.2 g/dL — AB (ref 13.0–17.0)
MCH: 30.4 pg (ref 26.0–34.0)
MCHC: 32.6 g/dL (ref 30.0–36.0)
MCV: 93.5 fL (ref 78.0–100.0)
Platelets: 198 10*3/uL (ref 150–400)
RBC: 3.68 MIL/uL — ABNORMAL LOW (ref 4.22–5.81)
RDW: 13.6 % (ref 11.5–15.5)
WBC: 8.1 10*3/uL (ref 4.0–10.5)

## 2017-05-19 LAB — CK: Total CK: 242 U/L (ref 49–397)

## 2017-05-19 LAB — GLUCOSE, CAPILLARY
Glucose-Capillary: 125 mg/dL — ABNORMAL HIGH (ref 65–99)
Glucose-Capillary: 136 mg/dL — ABNORMAL HIGH (ref 65–99)
Glucose-Capillary: 155 mg/dL — ABNORMAL HIGH (ref 65–99)
Glucose-Capillary: 129 mg/dL — ABNORMAL HIGH (ref 65–99)

## 2017-05-19 LAB — URINE CULTURE: Culture: NO GROWTH

## 2017-05-19 NOTE — Progress Notes (Signed)
Nutrition Brief Note  Patient identified on the Malnutrition Screening Tool (MST) Report  Pt with insignificant weight loss since June. Pt currently consuming 100% of meals today and 50-65% yesterday. Intakes improving.   Wt Readings from Last 15 Encounters:  05/17/17 155 lb 10.3 oz (70.6 kg)  12/13/16 165 lb 6.4 oz (75 kg)  06/10/16 170 lb (77.1 kg)    Body mass index is 20.53 kg/m. Patient meets criteria for normal based on current BMI.   Current diet order is Heart Healthy/CHO modified, patient is consuming approximately 100% of meals at this time. Labs and medications reviewed.   No nutrition interventions warranted at this time. If nutrition issues arise, please consult RD.   Clayton Bibles, MS, RD, [place] Dietitian Pager: [phone] After Hours Pager: [phone]

## 2017-05-19 NOTE — Clinical Social Work Placement (Signed)
Facility will the accept the patient on 11/22 before 12:00pm Nurse please call report [phone]/Complete Med. Nes.   CLINICAL SOCIAL WORK PLACEMENT  NOTE  Date:  05/19/2017  Patient Details  Name: [name] Cook MRN: [id number] Date of Birth: 1926/11/02  Clinical Social Work is seeking post-discharge placement for this patient at the [place] level of care (*CSW will initial, date and re-position this form in  chart as items are completed):  Yes   Patient/family provided with [place] Work Department's list of facilities offering this level of care within the geographic area requested by the patient (or if unable, by the patient's family).  Yes   Patient/family informed of their freedom to choose among providers that offer the needed level of care, that participate in Medicare, Medicaid or managed care program needed by the patient, have an available bed and are willing to accept the patient.  Yes   Patient/family informed of [facility]'s ownership interest in [facility] and [facility], as well as of the fact that they are under no obligation to receive care at these facilities.  PASRR submitted to EDS on       PASRR number received on       Existing PASRR number confirmed on 05/19/17     FL2 transmitted to all facilities in geographic area requested by pt/family on       FL2 transmitted to all facilities within larger geographic area on 05/19/17     Patient informed that his/her managed care company has contracts with or will negotiate with certain facilities, including the following:  [facility] Rehab     Yes   Patient/family informed of bed offers received.  Patient chooses bed at [place] recommends and patient chooses bed at      Patient to be transferred to [facility] on 05/20/17.  Patient to be transferred to facility by [facility]     Patient family notified on  05/19/17 of transfer.  Name of family member notified:        PHYSICIAN Please sign DNR     Additional Comment:    _______________________________________________ Lia Hopping, LCSW 05/19/2017, 4:15 PM

## 2017-05-19 NOTE — Progress Notes (Signed)
PROGRESS NOTE    SHIRAZ BASTYR  WUJ:[id number] DOB: 1926-07-05 DOA: 05/17/2017 PCP: Chesley Noon, MD   Brief Narrative:  [name] Cook is a 83 y.o. male with medical history significant of diabetes mellitus, hypertension and cognitive impairment. Presents after being found down by home health staff, apparently he felt the night before while trying to climb steps, denies any head trauma or loss of consciousness but apparently he was not able to stand back on his feet. FOund to have L2 fracture and UTI  Assessment & Plan:  1. L2 compression fracture -Secondary to mechanical fall  -X-rays noted age indeterminate minimal compression deformity or schmorl's node of superior endplate of L2 and stable T12 compression deformity.   -Continue scheduled Tylenol, when necessary oxycodone, start IV morphine -PT OT evaluation appreciated -Plan for SNF tomorrow  2. possible UTI noted on admission  Urine cultures negative  -Stop ceftriaxone today  3. 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 Mild rhabdomyolysis -Cut down IV fluids, hold statin -Improving  5. Type 2 diabetes mellitus. Will hold on oral hypoglycemic agents, continue insulin sliding scale for glucose coverage and monitoring.  5. Hypertension. Continue metoprolol tartrate, per his home regimen.    DVT prophylaxis: lovenox  Code Status: DNR/DNI Family Communication:No family at bedside  Disposition Plan:SNF tomorrow if stable  Consultants:   none  Procedures:  Renal US   Antimicrobials: Ceftriaxone 1119 -11/21  Anti-infectives (From admission, onward)   Start     Dose/Rate Route Frequency Ordered Stop   05/18/17 1200  cefTRIAXone (ROCEPHIN) 1 g in dextrose 5 % 50 mL IVPB     1 g 100 mL/hr over 30 Minutes Intravenous Every 24 hours 05/17/17 1830     05/17/17 1245  cefTRIAXone (ROCEPHIN) 1 g in dextrose 5 % 50 mL IVPB     1 g 100 mL/hr over 30 Minutes Intravenous  Once 05/17/17 1243 05/17/17 1327        Subjective: -Feels okay now, only has mild low back pain when he moves, is able to ambulate in the halls a bit Objective: Vitals:   05/18/17 0948 05/18/17 1354 05/19/17 0635 05/19/17 1118  BP: (!) 127/57 (!) 127/52 (!) 154/76 (!) 143/60  Pulse: 75 60 72 83  Resp:  19 18   Temp:  98.2 F (36.8 C) 98 F (36.7 C)   TempSrc:  Oral Oral   SpO2: 97% 97% 93%   Weight:      Height:        Intake/Output Summary (Last 24 hours) at 05/19/2017 1340 Last data filed at 05/19/2017 1000 Gross per 24 hour  Intake 2990 ml  Output 450 ml  Net 2540 ml   Filed Weights   05/17/17 0948 05/17/17 1626  Weight: 72.6 kg (160 lb) 70.6 kg (155 lb 10.3 oz)    Examination:  Gen: Awake, Alert, Oriented X 3,  HEENT: PERRLA, Neck supple, no JVD Lungs: Good air movement bilaterally, CTAB CVS: RRR,No Gallops,Rubs or new Murmurs Abd: soft, Non tender, non distended, BS present Extremities: No Cyanosis, Clubbing or edema Skin: no new rashes  Data Reviewed: I have personally reviewed following labs and imaging studies  CBC: Recent Labs  Lab 05/17/17 1204 05/18/17 0529 05/19/17 0611  WBC 14.3* 10.0 8.1  NEUTROABS 12.3*  --   --   HGB 13.8 11.9* 11.2*  HCT  41.2 36.5* 34.4*  MCV 92.0 93.4 93.5  PLT 279 230 956   Basic Metabolic Panel: Recent Labs  Lab 05/17/17 1204 05/18/17 0529 05/19/17 0611  NA 135 137 135  K 3.4* 3.7 3.5  CL 100* 106 107  CO2 26 23 23   GLUCOSE 180* 143* 134*  BUN 19 21* 20  CREATININE 0.86 0.83 0.76  CALCIUM 8.7* 8.1* 7.9*  MG 1.6*  --   --    GFR: Estimated Creatinine Clearance: 63.7 mL/min (by C-G formula based on SCr of 0.76 mg/dL). Liver Function Tests: Recent Labs  Lab 05/18/17 0529  AST 56*  ALT 28  ALKPHOS 75  BILITOT 1.7*  PROT 5.7*  ALBUMIN 2.8*   No results for input(s): LIPASE, AMYLASE  in the last 168 hours. No results for input(s): AMMONIA in the last 168 hours. Coagulation Profile: No results for input(s): INR, PROTIME in the last 168 hours. Cardiac Enzymes: Recent Labs  Lab 05/17/17 1204 05/18/17 0529 05/19/17 0611  CKTOTAL 3,259* 913* 242   BNP (last 3 results) No results for input(s): PROBNP in the last 8760 hours. HbA1C: No results for input(s): HGBA1C in the last 72 hours. CBG: Recent Labs  Lab 05/18/17 1150 05/18/17 1703 05/18/17 2211 05/19/17 0719 05/19/17 1127  GLUCAP 155* 144* 128* 125* 136*   Lipid Profile: No results for input(s): CHOL, HDL, LDLCALC, TRIG, CHOLHDL, LDLDIRECT in the last 72 hours. Thyroid Function Tests: No results for input(s): TSH, T4TOTAL, FREET4, T3FREE, THYROIDAB in the last 72 hours. Anemia Panel: No results for input(s): VITAMINB12, FOLATE, FERRITIN, TIBC, IRON, RETICCTPCT in the last 72 hours. Sepsis Labs: No results for input(s): PROCALCITON, LATICACIDVEN in the last 168 hours.  Recent Results (from the past 240 hour(s))  Urine culture     Status: None   Collection Time: 05/18/17 11:12 AM  Result Value Ref Range Status   Specimen Description URINE, CLEAN CATCH  Final   Special Requests NONE  Final   Culture   Final    NO GROWTH Performed at [facility], [address], [place],  [id number]    Report Status 05/19/2017 FINAL  Final         Radiology Studies: US Renal  Result Date: 05/18/2017 CLINICAL DATA:  UTI EXAM: RENAL / URINARY TRACT ULTRASOUND COMPLETE COMPARISON:  None. FINDINGS: Right Kidney: Length: 11.4 cm. Echogenicity within normal limits. No mass or hydronephrosis visualized. Left Kidney: Length: 12.1 cm.  Moderate hydronephrosis is noted on the left. Bladder: The bladder is partially distended. There are bladder wall diverticulum identified as well as a soft tissue mass which measures 3.9 cm in greatest dimension within the lumen of the bladder. The prostate is also enlarged in  size. Direct visualization is recommended. IMPRESSION: Soft tissue mass lesion is noted within the lumen of the bladder as well as left-sided hydronephrosis. This may be related to the lesion within the bladder. Direct visualization is recommended. Bladder wall diverticula. Electronically Signed   By: Inez Catalina M.D.   On: 05/18/2017 19:46        Scheduled Meds: . acetaminophen  1,000 mg Oral Q8H  . aspirin EC  81 mg Oral Daily  . enoxaparin (LOVENOX) injection  40 mg Subcutaneous Q24H  . insulin aspart  0-9 Units Subcutaneous TID WC  . metoprolol tartrate  50 mg Oral Daily  . sodium chloride flush  3 mL Intravenous Q12H   Continuous Infusions: . sodium chloride 10 mL/hr at 05/19/17 1114  . cefTRIAXone (ROCEPHIN)  IV Stopped (05/19/17 1200)     LOS: 2 days    Time spent: over 30 minutes    Domenic Polite, MD [facility] Page via [email or web address], password [email or web address]   If 7PM-7AM, please contact night-coverage [email or web address] Password [email or web address] 05/19/2017, 1:40 PM

## 2017-05-20 LAB — CBC
HCT: 35.4 % — ABNORMAL LOW (ref 39.0–52.0)
Hemoglobin: 11.7 g/dL — ABNORMAL LOW (ref 13.0–17.0)
MCH: 30.4 pg (ref 26.0–34.0)
MCHC: 33.1 g/dL (ref 30.0–36.0)
MCV: 91.9 fL (ref 78.0–100.0)
PLATELETS: 226 10*3/uL (ref 150–400)
RBC: 3.85 MIL/uL — ABNORMAL LOW (ref 4.22–5.81)
RDW: 13.5 % (ref 11.5–15.5)
WBC: 7.4 10*3/uL (ref 4.0–10.5)

## 2017-05-20 LAB — BASIC METABOLIC PANEL
Anion gap: 6 (ref 5–15)
BUN: 16 mg/dL (ref 6–20)
CALCIUM: 8.2 mg/dL — AB (ref 8.9–10.3)
CO2: 25 mmol/L (ref 22–32)
Chloride: 104 mmol/L (ref 101–111)
Creatinine, Ser: 0.77 mg/dL (ref 0.61–1.24)
GFR calc Af Amer: >60 mL/min (ref >60)
GLUCOSE: 141 mg/dL — AB (ref 65–99)
Potassium: 3.5 mmol/L (ref 3.5–5.1)
Sodium: 135 mmol/L (ref 135–145)

## 2017-05-20 LAB — VITAMIN D 25 HYDROXY (VIT D DEFICIENCY, FRACTURES): Vit D, 25-Hydroxy: 11.9 ng/mL — ABNORMAL LOW (ref 30.0–100.0)

## 2017-05-20 LAB — GLUCOSE, CAPILLARY
Glucose-Capillary: 142 mg/dL — ABNORMAL HIGH (ref 65–99)
Glucose-Capillary: 232 mg/dL — ABNORMAL HIGH (ref 65–99)

## 2017-05-20 MED ORDER — SENNA 8.6 MG PO TABS: 1.0000 | ORAL_TABLET | Freq: Every day | ORAL | 0 refills | Status: DC | PRN

## 2017-05-20 MED ORDER — OXYCODONE HCL 5 MG PO TABS: 2.5000 mg | ORAL_TABLET | Freq: Four times a day (QID) | ORAL | 0 refills | Status: DC | PRN

## 2017-05-20 NOTE — Progress Notes (Signed)
Patient is set to discharge to [facility] today. Patient aware. Discharge packet given to RN. [facility] called for transport.   Kingsley Spittle, LCSWA Clinical Social Worker [phone]

## 2017-05-20 NOTE — Discharge Summary (Signed)
Physician Discharge Summary  [name] Cook CWC:[id number] DOB: 28-Oct-1926 DOA: 05/17/2017  PCP: Chesley Noon, MD  Admit date: 05/17/2017 Discharge date: 05/20/2017  Time spent: 20minutes  Recommendations for Outpatient Follow-up:  1. Please ensure Urology Follow up and Cystoscopy and Biopsy for Bladder wall irregularity 2. PCP in 1week  Discharge Diagnoses:  Active Problems:   Compression fracture of body of thoracic vertebra (HCC)   L2 compression fracture   Bladder mass/irregularity needs Biopsy   Diabetes   Hypertension   Rhabdomyolysis   Discharge Condition: stable  Diet recommendation: diabetic  Filed Weights   05/17/17 0948 05/17/17 1626  Weight: 72.6 kg (160 lb) 70.6 kg (155 lb 10.3 oz)    History of present illness:  [name] Cook a 83 y.o.malewith medical history significant ofdiabetes mellitus, hypertension and cognitive impairment.Presents after being found down by home health staff,apparently he felt the night before while trying to climb steps,denies any head trauma or loss of consciousness but apparently he was not able to stand back on his feet. FOund to have L2 fracture and abnormal UA.   Hospital Course:  1. L2 compression fracture -Secondary to mechanical fall  -X-rays noted age indeterminate minimal compression deformity or schmorl's node of superior endplate of L2 and stable T12 compression deformity.   -treated with Tylenol, low dose PRN oxycodone -PT OT evaluation completed -Plan for SNF today for Rehab  2. possible UTI noted on admission  Urine cultures negative  -Stopped ceftriaxone   3. 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 Mild rhabdomyolysis -due to fall -Improved with hydration  5. Type 2 diabetes mellitus. -resume oral hypoglycemics at  discharge  5.Hypertension. Continue metoprolol tartrate,per his home regimen.     Discharge Exam: Vitals:   05/20/17 0608 05/20/17 1028  BP: (!) 158/64 139/71  Pulse: 71 71  Resp: 18   Temp: 98.7 F (37.1 C)   SpO2: 93%     General: AAOx3 Cardiovascular: S1S2/RRR Respiratory: CTAB  Discharge Instructions   Discharge Instructions    Diet - low sodium heart healthy   Complete by:  As directed    Increase activity slowly   Complete by:  As directed      Current Discharge Medication List    START taking these medications   Details  oxyCODONE (OXY IR/ROXICODONE) 5 MG immediate release tablet Take 0.5 tablets (2.5 mg total) by mouth every 6 (six) hours as needed for moderate pain. Qty: 10 tablet, Refills: 0    senna (SENOKOT) 8.6 MG TABS tablet Take 1 tablet (8.6 mg total) by mouth daily as needed for mild constipation. Qty: 10 each, Refills: 0      CONTINUE these medications which have NOT CHANGED   Details  aspirin EC 81 MG tablet Take 1 tablet (81 mg total) by mouth daily. Qty: 90 tablet, Refills: 3    glipiZIDE (GLUCOTROL XL) 10 MG 24 hr tablet Take 10 mg daily with breakfast by mouth.    metoprolol tartrate (LOPRESSOR) 50 MG tablet Take 1 tablet (50 mg total) by mouth 2 (two) times daily. Qty: 60 tablet, Refills: 11    rosuvastatin (CRESTOR) 20 MG tablet Take 20 mg daily by mouth.    nitroGLYCERIN (NITROSTAT) 0.4 MG SL tablet Place 1 tablet (0.4 mg total) under the tongue every 5 (five) minutes as needed for chest pain. Qty: 25  tablet, Refills: 5      STOP taking these medications     atorvastatin (LIPITOR) 40 MG tablet      cephALEXin (KEFLEX) 500 MG capsule        No Known Allergies  Contact information for follow-up providers    Chesley Noon, MD. Schedule an appointment as soon as possible for a visit in 1 week(s).   Specialty:  Family Medicine Why:  Needs Urology Follow UP and CYstoscopy Contact information: [place] [address] [phone]            Contact information for after-discharge care    Destination    [facility] up.   Service:  Skilled Nursing Contact information: [address] [place] [phone]                   The results of significant diagnostics from this hospitalization (including imaging, microbiology, ancillary and laboratory) are listed below for reference.    Significant Diagnostic Studies: Dg Lumbar Spine Complete  Result Date: 05/17/2017 CLINICAL DATA:  Low back pain after falling today. Initial encounter. EXAM: LUMBAR SPINE - COMPLETE 4+ VIEW COMPARISON:  Abdominal radiographs 08/30/2015. Chest radiographs 12/12/2016 and 06/10/2015. FINDINGS: Five lumbar type vertebral bodies. The bones are demineralized. A superior endplate compression deformity at T12 is unchanged from the chest radiographs of 5 months ago. There is a possible minimal compression deformity or Schmorl's node involving the superior endplate of L2, age indeterminate. There is mild intervertebral spurring and facet hypertrophy throughout the lumbar spine. There is diffuse aortoiliac atherosclerosis. IMPRESSION: Age indeterminate minimal compression deformity or Schmorl's node involving the superior endplate of L2. T12 compression deformity, stable from chest radiographs of 5 months ago. Spondylosis and aortic atherosclerosis. Electronically Signed   By: Richardean Sale M.D.   On: 05/17/2017 11:52   US Renal  Result Date: 05/18/2017 CLINICAL DATA:  UTI EXAM: RENAL / URINARY TRACT ULTRASOUND COMPLETE COMPARISON:  None. FINDINGS: Right Kidney: Length: 11.4 cm. Echogenicity within normal limits. No mass or hydronephrosis visualized. Left Kidney: Length: 12.1 cm.  Moderate hydronephrosis is noted on the left. Bladder: The bladder is partially distended. There are bladder wall diverticulum identified as well as a soft tissue mass  which measures 3.9 cm in greatest dimension within the lumen of the bladder. The prostate is also enlarged in size. Direct visualization is recommended. IMPRESSION: Soft tissue mass lesion is noted within the lumen of the bladder as well as left-sided hydronephrosis. This may be related to the lesion within the bladder. Direct visualization is recommended. Bladder wall diverticula. Electronically Signed   By: Inez Catalina M.D.   On: 05/18/2017 19:46    Microbiology: Recent Results (from the past 240 hour(s))  Urine culture     Status: None   Collection Time: 05/18/17 11:12 AM  Result Value Ref Range Status   Specimen Description URINE, CLEAN CATCH  Final   Special Requests NONE  Final   Culture   Final    NO GROWTH Performed at [facility], [address] [place], [place] [address]    Report Status 05/19/2017 FINAL  Final     Labs: Basic Metabolic Panel: Recent Labs  Lab 05/17/17 1204 05/18/17 0529 05/19/17 0611 05/20/17 0553  NA 135 137 135 135  K 3.4* 3.7 3.5 3.5  CL 100* 106 107 104  CO2 26 23 23 25   GLUCOSE 180* 143* 134* 141*  BUN 19 21* 20 16  CREATININE 0.86 0.83 0.76 0.77  CALCIUM 8.7* 8.1* 7.9* 8.2*  MG 1.6*  --   --   --    Liver Function Tests: Recent Labs  Lab 05/18/17 0529  AST 56*  ALT 28  ALKPHOS 75  BILITOT 1.7*  PROT 5.7*  ALBUMIN 2.8*   No results for input(s): LIPASE, AMYLASE in the last 168 hours. No results for input(s): AMMONIA in the last 168 hours. CBC: Recent Labs  Lab 05/17/17 1204 05/18/17 0529 05/19/17 0611 05/20/17 0553  WBC 14.3* 10.0 8.1 7.4  NEUTROABS 12.3*  --   --   --   HGB 13.8 11.9* 11.2* 11.7*  HCT 41.2 36.5* 34.4* 35.4*  MCV 92.0 93.4 93.5 91.9  PLT 279 230 198 226   Cardiac Enzymes: Recent Labs  Lab 05/17/17 1204 05/18/17 0529 05/19/17 0611  CKTOTAL 3,259* 913* 242   BNP: BNP (last 3 results) No results for input(s): BNP in the last 8760 hours.  ProBNP (last 3 results) No results for input(s):  PROBNP in the last 8760 hours.  CBG: Recent Labs  Lab 05/19/17 0719 05/19/17 1127 05/19/17 1616 05/19/17 2149 05/20/17 0758  GLUCAP 125* 136* 155* 129* 142*       Signed:  Domenic Polite MD.  [facility] 05/20/2017, 10:53 AM

## 2017-05-20 NOTE — Progress Notes (Signed)
Patient was A&O, tolerating diet, and no complaints of pain. [facility] arrived to transport patient to [facility]. Discharge instructions were put in patient's packet. RN called and gave report to nurse at [facility].

## 2017-05-24 ENCOUNTER — Encounter: Payer: Self-pay | Admitting: Internal Medicine

## 2017-05-24 ENCOUNTER — Non-Acute Institutional Stay (SKILLED_NURSING_FACILITY): Payer: Medicare Other | Admitting: Internal Medicine

## 2017-05-24 DIAGNOSIS — S32020D Wedge compression fracture of second lumbar vertebra, subsequent encounter for fracture with routine healing: Secondary | ICD-10-CM

## 2017-05-24 DIAGNOSIS — E119 Type 2 diabetes mellitus without complications: Secondary | ICD-10-CM

## 2017-05-24 DIAGNOSIS — F0390 Unspecified dementia without behavioral disturbance: Secondary | ICD-10-CM | POA: Insufficient documentation

## 2017-05-24 DIAGNOSIS — N3289 Other specified disorders of bladder: Secondary | ICD-10-CM

## 2017-05-24 DIAGNOSIS — M6282 Rhabdomyolysis: Secondary | ICD-10-CM | POA: Insufficient documentation

## 2017-05-24 DIAGNOSIS — S32020A Wedge compression fracture of second lumbar vertebra, initial encounter for closed fracture: Secondary | ICD-10-CM | POA: Insufficient documentation

## 2017-05-24 DIAGNOSIS — T796XXD Traumatic ischemia of muscle, subsequent encounter: Secondary | ICD-10-CM | POA: Diagnosis not present

## 2017-05-24 NOTE — Assessment & Plan Note (Signed)
Urology consultation 

## 2017-05-24 NOTE — Assessment & Plan Note (Addendum)
SLUMS assessment: 18/30 suggesting dementia. SLUMS is a mental status assessment of possible dementia published by the [employer]. The test differentiates between high school or less education level in reference to presence or absence of dementia. For high school education a score of 27-30 is normal, 21-26 is minimal neurocognitive deficit and 1-22 suggests dementia. With less than a high school education similar scoring is 25-30, 20-24, and 1-19. Patient has advanced college degrees. PCP will be notified of results

## 2017-05-24 NOTE — Assessment & Plan Note (Signed)
CK high of 3259, resolved with hydration

## 2017-05-24 NOTE — Patient Instructions (Addendum)
See assessment and plan under each diagnosis in the problem list and acutely for this visit I'm informed that the patient's family wants to transfer him from this facility to [facility]. Because of the pending transfer urology consultation was not arranged as the contact information would be for this facility.

## 2017-05-24 NOTE — Assessment & Plan Note (Signed)
Repeat A1c 

## 2017-05-24 NOTE — Assessment & Plan Note (Signed)
PT/OT at SNF °

## 2017-05-24 NOTE — Progress Notes (Signed)
NURSING HOME LOCATION:  [facility] ROOM NUMBER:  120-A  CODE STATUS:  Full Code  PCP:  Chesley Noon, MD  [place] [id number]    This is a comprehensive admission note to [facility] performed on this date less than 30 days from date of admission. Included are preadmission medical/surgical history; reconciled medication list; family history; social history and comprehensive review of systems.  Corrections and additions to the records were documented. Comprehensive physical exam was also performed. Additionally a clinical summary was entered for each active diagnosis pertinent to this admission in the Problem List to enhance continuity of care.  HPI: he was hospitalized 11/19-11/22/18, after being found down at home by his sitter.[name] Cook Apparently he had fallen the night before trying to climb steps. Denied were associated  head trauma or loss of consciousness. He was found to have an age indeterminate L2 compression fracture  with minimal compression of the superior endplate of L2 and stable T12 compression deformity  He was placed on low-dose oxycodone as needed. Urinalysis revealed large amount of hemoglobin with too numerous to count red cells and white cells. Culture revealed no growth. Ceftriaxone initiated empirically was stopped. Renal ultrasound 11/20 suggested bladder irregularity versus possible mass. He has a history of prostate surgery in the past. Urology outpatient follow-up in 2-4 weeks for cystoscopy and biopsy are recommended. Clinically he was found to have rhabdomyolysis related to the fall. CK enzymes improved with hydration from a high of 3259 down to 242. Type 2 diabetes as well as his hypertension were  adequately controlled. with adequate controlled  Discharge lab abnormalities included a glucose of 141, calcium 8.2, hemoglobin 11.7, hematocrit 35.4. Indices were normochromic, normocytic. The last A1c on record was 6.9% on 6/17,  indicating adequate control at that time.  Past medical and surgical history: STEMI involving RCA, chronic hearing loss, dementia, and dermatologic cancer. He has had coronary angioplasty with stent placement and cholecystectomy.  Social history: Quit smoking 1970, nondrinker.  Family history: No data on record. He states his mother was a diabetic but denies family history of heart attack, stroke, or cancer.  Review of systems: SNF glucoses have ranged from a low of 106 up to high of 151. Complete ROS could not be completed due to dementia. Date given as August 22 or 23rd, 2018. He repeatedly asked me how long he would be here at this facility. He could not remember the name of his urologist. All he can remember was that "he is retired" He specifically denied any cardiac or neurologic prodrome prior to the fall. He states the simply did not grasp the handrail and fell backwards. Apparently his sitter found him when she came in. The sitter comes in Monday, Wednesday, and Friday. Despite the abnormal urine, he denies any genitourinary symptoms either.  Constitutional: No fever,significant weight change, fatigue  Eyes: No redness, discharge, pain, vision change ENT/mouth: No nasal congestion,  purulent discharge, earache, change in hearing, sore throat  Cardiovascular: No chest pain, palpitations,paroxysmal nocturnal dyspnea, claudication, edema  Respiratory: No cough, sputum production, hemoptysis, DOE, significant snoring,apnea  Gastrointestinal: No heartburn,dysphagia,abdominal pain, nausea/ vomiting,rectal bleeding, melena,change in bowels Genitourinary: No dysuria,hematuria, pyuria,  incontinence, nocturia Musculoskeletal: No joint stiffness, joint swelling, weakness, pain Dermatologic: No rash, pruritus, change in appearance of skin Neurologic: No dizziness, headache, syncope, seizures, numbness, tingling Psychiatric: No significant anxiety , depression, insomnia, anorexia Endocrine: No  change in hair/skin/ nails, excessive thirst, excessive hunger, excessive urination  Hematologic/lymphatic: No significant bruising, lymphadenopathy,abnormal bleeding Allergy/immunology: No itchy/ watery eyes, significant sneezing, urticaria, angioedema  Physical exam:  Pertinent or positive findings: He appears disheveled and is unshaven. He has very subtle ptosis on the left. He has an upper partial  & lower plate. Heart rhythm and rate are irregular and heart sounds are distant. Breath sounds are decreased. Pedal pulses are decreased. Strength is surprisingly good, but he has an intention tremor with opposition in the left upper extremity. The left elbow is dressed. He has a bruise over the right elbow. He has a myriad keratotic lesions over the forearms and dorsum of the hands. General appearance:Adequately nourished; no acute distress , increased work of breathing is present.   Lymphatic: No lymphadenopathy about the head, neck, axilla . Eyes: No conjunctival inflammation or lid edema is present. There is no scleral icterus. Ears:  External ear exam shows no significant lesions or deformities.   Nose:  External nasal examination shows no deformity or inflammation. Nasal mucosa are pink and moist without lesions ,exudates Oral exam: lips and gums are healthy appearing.There is no oropharyngeal erythema or exudate . Neck:  No thyromegaly, masses, tenderness noted.    Heart:  No gallop, murmur, click, rub .  Lungs: without wheezes, rhonchi,rales , rubs. Abdomen:Bowel sounds are normal. Abdomen is soft and nontender with no organomegaly, hernias,masses. GU: deferred  Extremities:  No cyanosis, clubbing,edema  Neurologic exam : Balance,Rhomberg,finger to nose testing could not be completed due to clinical state Skin: Warm & dry w/o tenting. No significant rash.  See clinical summary under each active problem in the Problem List with associated updated therapeutic plan

## 2017-05-25 ENCOUNTER — Other Ambulatory Visit: Payer: Self-pay | Admitting: *Deleted

## 2017-05-25 NOTE — Assessment & Plan Note (Deleted)
PT/OT at the SNF 

## 2017-05-25 NOTE — Patient Outreach (Signed)
[facility]) Care Management  05/25/2017  [name] Cook 09-24-1926 [id number]   Met with [name], SW at facility.  She reports that patient is transferring to another facility today.  This facility is one that this RNCM will be able to follow patient at. Plan to see patient at other facility after his transfer. [name]. [name], RN, BSN, [place] [phone]) Business Cell  [phone]) Toll Free Office

## 2017-06-10 ENCOUNTER — Other Ambulatory Visit: Payer: Self-pay | Admitting: *Deleted

## 2017-06-10 NOTE — Patient Outreach (Signed)
[facility]) Care Management  06/10/2017  [name] Cook September 15, 1926 [id number]   Per Vickii Chafe, SW at facility. She reports patient family is seeking ALF placement for patient. Will sign off as patient with no [facility] care management needs at this time.  [name]. [name], RN, BSN, [place] [phone]) Business Cell  [phone]) Toll Free Office

## 2017-06-25 ENCOUNTER — Other Ambulatory Visit: Payer: Self-pay | Admitting: Urology

## 2017-07-14 NOTE — Progress Notes (Signed)
Called [facility] to request current [facility] and medical information on patient.  Patient is not a resident at [facility].  Called Darrel Reach and LVMM stating above and requesting contact information for patient.

## 2017-07-16 ENCOUNTER — Other Ambulatory Visit: Payer: Self-pay

## 2017-07-16 ENCOUNTER — Encounter (HOSPITAL_COMMUNITY): Payer: Self-pay | Admitting: *Deleted

## 2017-07-16 NOTE — Progress Notes (Signed)
[facility] to get information about patient for surgery-requesting MAR and progress notes. Spoke to [place], [place]. Tech and stated she would fax information to me.

## 2017-07-16 NOTE — Progress Notes (Addendum)
Preop instructions for:  Lillian Tigges                       Date of Birth  -1926/11/15                          Date of Procedure: 07/19/2017       Doctor:Dr. Link Snuffer  Time to arrive at [facility]:1000 am  Report to: Admitting  Procedure:Transurethral resection of Bladder tumor Any procedure time changes, MD office will notify you!   Do not eat  past midnight the night before your procedure.(To include any tube feedings-must be discontinued)  Patient can have clear liquids from midnight up until 0700 am morning of surgery then nothing until after surgery!  Take these morning medications only with sips of water.(or give through gastrostomy or feeding tube). Metoprolol (Lopressor), Rosuvastatin (Crestor)  Note: No Insulin or Diabetic meds should be given or taken the morning of the procedure!   Facility contact: [facility]                  Phone:  [place] POA: [place]  Transportation contact phone#:[facility] [phone]  Please send day of procedure:current med list and meds last taken that day, confirm nothing by mouth status from what time, Patient Demographic info( to include DNR status, problem list, allergies)   RN contact name/phone#:Cindy, RN ,[place]                             And  Fax [phone]  [employer] card and picture ID Leave all jewelry and other valuables at place where living( no metal or rings to be worn) No contact lens Women-no make-up, no lotions,perfumes,powders Men-no colognes,lotions  Any questions day of procedure,call  Short Stay [phone]!   Sent from :[facility]                   [place]                   Fax:[phone]  Sent by :Charmaine Downs. Tobin Chad

## 2017-07-16 NOTE — Progress Notes (Signed)
Faxed pre-op instructions over to [facility] to Attention: Cindy . She called me back to inform me she received the pre-op instructions and verbalized understanding of the instructions.

## 2017-07-16 NOTE — Progress Notes (Signed)
Talked to daughter , Domenick Bookbinder  Via phone and reviewed information about medical history for surgery.

## 2017-07-16 NOTE — Progress Notes (Signed)
[place] to have records Faxed to me about medical history and medications for patient's surgery on 07/19/2017.

## 2017-07-19 ENCOUNTER — Observation Stay (HOSPITAL_COMMUNITY)
Admission: RE | Admit: 2017-07-19 | Discharge: 2017-07-20 | Disposition: A | Discharge status: Skilled Nursing Facility | Payer: Medicare Other | Source: Ambulatory Visit | Attending: Urology | Admitting: Urology

## 2017-07-19 ENCOUNTER — Other Ambulatory Visit: Payer: Self-pay

## 2017-07-19 ENCOUNTER — Encounter (HOSPITAL_COMMUNITY)
Admission: RE | Disposition: A | Discharge status: Skilled Nursing Facility | Payer: Self-pay | Source: Ambulatory Visit | Attending: Urology

## 2017-07-19 ENCOUNTER — Ambulatory Visit (HOSPITAL_COMMUNITY): Payer: Medicare Other | Admitting: Anesthesiology

## 2017-07-19 ENCOUNTER — Encounter (HOSPITAL_COMMUNITY): Payer: Self-pay | Admitting: Anesthesiology

## 2017-07-19 DIAGNOSIS — E119 Type 2 diabetes mellitus without complications: Secondary | ICD-10-CM | POA: Insufficient documentation

## 2017-07-19 DIAGNOSIS — N329 Bladder disorder, unspecified: Secondary | ICD-10-CM | POA: Diagnosis present

## 2017-07-19 DIAGNOSIS — Z7984 Long term (current) use of oral hypoglycemic drugs: Secondary | ICD-10-CM | POA: Diagnosis not present

## 2017-07-19 DIAGNOSIS — C678 Malignant neoplasm of overlapping sites of bladder: Secondary | ICD-10-CM | POA: Diagnosis not present

## 2017-07-19 DIAGNOSIS — Z79899 Other long term (current) drug therapy: Secondary | ICD-10-CM | POA: Insufficient documentation

## 2017-07-19 DIAGNOSIS — Z79891 Long term (current) use of opiate analgesic: Secondary | ICD-10-CM | POA: Insufficient documentation

## 2017-07-19 DIAGNOSIS — I252 Old myocardial infarction: Secondary | ICD-10-CM | POA: Diagnosis not present

## 2017-07-19 DIAGNOSIS — F0390 Unspecified dementia without behavioral disturbance: Secondary | ICD-10-CM | POA: Diagnosis not present

## 2017-07-19 DIAGNOSIS — I2510 Atherosclerotic heart disease of native coronary artery without angina pectoris: Secondary | ICD-10-CM | POA: Diagnosis not present

## 2017-07-19 DIAGNOSIS — Z955 Presence of coronary angioplasty implant and graft: Secondary | ICD-10-CM | POA: Diagnosis not present

## 2017-07-19 DIAGNOSIS — I10 Essential (primary) hypertension: Secondary | ICD-10-CM | POA: Insufficient documentation

## 2017-07-19 DIAGNOSIS — D414 Neoplasm of uncertain behavior of bladder: Secondary | ICD-10-CM | POA: Diagnosis present

## 2017-07-19 DIAGNOSIS — Z85828 Personal history of other malignant neoplasm of skin: Secondary | ICD-10-CM | POA: Insufficient documentation

## 2017-07-19 DIAGNOSIS — Z87891 Personal history of nicotine dependence: Secondary | ICD-10-CM | POA: Diagnosis not present

## 2017-07-19 HISTORY — PX: TRANSURETHRAL RESECTION OF BLADDER TUMOR: SHX2575

## 2017-07-19 LAB — HEMOGLOBIN A1C
Hgb A1c MFr Bld: 6.9 % — ABNORMAL HIGH (ref 4.8–5.6)
MEAN PLASMA GLUCOSE: 151.33 mg/dL

## 2017-07-19 LAB — CBC
HEMATOCRIT: 39.4 % (ref 39.0–52.0)
Hemoglobin: 12.7 g/dL — ABNORMAL LOW (ref 13.0–17.0)
MCH: 29.7 pg (ref 26.0–34.0)
MCHC: 32.2 g/dL (ref 30.0–36.0)
MCV: 92.3 fL (ref 78.0–100.0)
Platelets: 366 10*3/uL (ref 150–400)
RBC: 4.27 MIL/uL (ref 4.22–5.81)
RDW: 13.6 % (ref 11.5–15.5)
WBC: 9.7 10*3/uL (ref 4.0–10.5)

## 2017-07-19 LAB — HEMOGLOBIN AND HEMATOCRIT, BLOOD
HCT: 39.4 % (ref 39.0–52.0)
Hemoglobin: 12.7 g/dL — ABNORMAL LOW (ref 13.0–17.0)

## 2017-07-19 LAB — BASIC METABOLIC PANEL
ANION GAP: 9 (ref 5–15)
BUN: 20 mg/dL (ref 6–20)
CALCIUM: 9.1 mg/dL (ref 8.9–10.3)
CO2: 28 mmol/L (ref 22–32)
Chloride: 99 mmol/L — ABNORMAL LOW (ref 101–111)
Creatinine, Ser: 1.4 mg/dL — ABNORMAL HIGH (ref 0.61–1.24)
GFR calc Af Amer: 50 mL/min — ABNORMAL LOW (ref >60)
GFR, EST NON AFRICAN AMERICAN: 43 mL/min — AB (ref >60)
Glucose, Bld: 116 mg/dL — ABNORMAL HIGH (ref 65–99)
POTASSIUM: 3.9 mmol/L (ref 3.5–5.1)
Sodium: 136 mmol/L (ref 135–145)

## 2017-07-19 LAB — GLUCOSE, CAPILLARY
GLUCOSE-CAPILLARY: 110 mg/dL — AB (ref 65–99)
Glucose-Capillary: 117 mg/dL — ABNORMAL HIGH (ref 65–99)
Glucose-Capillary: 130 mg/dL — ABNORMAL HIGH (ref 65–99)
Glucose-Capillary: 277 mg/dL — ABNORMAL HIGH (ref 65–99)

## 2017-07-19 LAB — MRSA PCR SCREENING: MRSA by PCR: NEGATIVE

## 2017-07-19 SURGERY — TURBT (TRANSURETHRAL RESECTION OF BLADDER TUMOR)
Anesthesia: General | Site: Bladder

## 2017-07-19 MED ORDER — FENTANYL CITRATE (PF) 100 MCG/2ML IJ SOLN
25.0000 ug | INTRAMUSCULAR | Status: DC | PRN
  Administered 2017-07-19 (×3): 50 ug via INTRAVENOUS

## 2017-07-19 MED ORDER — INSULIN ASPART 100 UNIT/ML ~~LOC~~ SOLN
0.0000 [IU] | Freq: Three times a day (TID) | SUBCUTANEOUS | Status: DC
  Administered 2017-07-19: 2 [IU] via SUBCUTANEOUS
  Administered 2017-07-20: 3 [IU] via SUBCUTANEOUS

## 2017-07-19 MED ORDER — METOPROLOL TARTRATE 50 MG PO TABS
50.0000 mg | ORAL_TABLET | Freq: Two times a day (BID) | ORAL | Status: DC
  Administered 2017-07-19 – 2017-07-20 (×2): 50 mg via ORAL
  Filled 2017-07-19 (×2): qty 1

## 2017-07-19 MED ORDER — SODIUM CHLORIDE 0.9 % IR SOLN
Status: DC | PRN
  Administered 2017-07-19 (×6): 3000 mL via INTRAVESICAL

## 2017-07-19 MED ORDER — ACETAMINOPHEN 10 MG/ML IV SOLN
INTRAVENOUS | Status: AC
  Filled 2017-07-19: qty 100

## 2017-07-19 MED ORDER — ROSUVASTATIN CALCIUM 20 MG PO TABS
20.0000 mg | ORAL_TABLET | Freq: Every day | ORAL | Status: DC
  Administered 2017-07-19 – 2017-07-20 (×2): 20 mg via ORAL
  Filled 2017-07-19 (×2): qty 1

## 2017-07-19 MED ORDER — PROPOFOL 10 MG/ML IV BOLUS
INTRAVENOUS | Status: DC | PRN
  Administered 2017-07-19: 90 mg via INTRAVENOUS

## 2017-07-19 MED ORDER — ACETAMINOPHEN 10 MG/ML IV SOLN
1000.0000 mg | Freq: Once | INTRAVENOUS | Status: AC
  Administered 2017-07-19: 1000 mg via INTRAVENOUS

## 2017-07-19 MED ORDER — MORPHINE SULFATE (PF) 4 MG/ML IV SOLN
2.0000 mg | INTRAVENOUS | Status: DC | PRN
  Administered 2017-07-20: 4 mg via INTRAVENOUS
  Administered 2017-07-20: 2 mg via INTRAVENOUS
  Filled 2017-07-19 (×2): qty 1

## 2017-07-19 MED ORDER — DEXMEDETOMIDINE HCL IN NACL 200 MCG/50ML IV SOLN
INTRAVENOUS | Status: AC
  Filled 2017-07-19: qty 50

## 2017-07-19 MED ORDER — SODIUM CHLORIDE 0.9 % IV SOLN
INTRAVENOUS | Status: DC
  Administered 2017-07-19: 18:00:00 via INTRAVENOUS

## 2017-07-19 MED ORDER — DEXAMETHASONE SODIUM PHOSPHATE 10 MG/ML IJ SOLN
INTRAMUSCULAR | Status: DC | PRN
  Administered 2017-07-19: 10 mg via INTRAVENOUS

## 2017-07-19 MED ORDER — LACTATED RINGERS IV SOLN
INTRAVENOUS | Status: DC
  Administered 2017-07-19 (×3): via INTRAVENOUS

## 2017-07-19 MED ORDER — PROPOFOL 10 MG/ML IV BOLUS
INTRAVENOUS | Status: AC
  Filled 2017-07-19: qty 40

## 2017-07-19 MED ORDER — DEXAMETHASONE SODIUM PHOSPHATE 10 MG/ML IJ SOLN
INTRAMUSCULAR | Status: AC
  Filled 2017-07-19: qty 1

## 2017-07-19 MED ORDER — SUGAMMADEX SODIUM 200 MG/2ML IV SOLN
INTRAVENOUS | Status: AC
  Filled 2017-07-19: qty 2

## 2017-07-19 MED ORDER — ROCURONIUM BROMIDE 100 MG/10ML IV SOLN
INTRAVENOUS | Status: DC | PRN
  Administered 2017-07-19: 10 mg via INTRAVENOUS
  Administered 2017-07-19: 40 mg via INTRAVENOUS

## 2017-07-19 MED ORDER — SENNA 8.6 MG PO TABS
1.0000 | ORAL_TABLET | Freq: Two times a day (BID) | ORAL | Status: DC
  Administered 2017-07-19 – 2017-07-20 (×2): 8.6 mg via ORAL
  Filled 2017-07-19 (×2): qty 1

## 2017-07-19 MED ORDER — ONDANSETRON HCL 4 MG/2ML IJ SOLN
4.0000 mg | INTRAMUSCULAR | Status: DC | PRN
Start: 2017-07-19 — End: 2017-07-20

## 2017-07-19 MED ORDER — SUCCINYLCHOLINE CHLORIDE 200 MG/10ML IV SOSY
PREFILLED_SYRINGE | INTRAVENOUS | Status: AC
  Filled 2017-07-19: qty 90

## 2017-07-19 MED ORDER — CEFAZOLIN SODIUM-DEXTROSE 2-4 GM/100ML-% IV SOLN
2.0000 g | INTRAVENOUS | Status: AC
  Administered 2017-07-19: 2 g via INTRAVENOUS
  Filled 2017-07-19: qty 100

## 2017-07-19 MED ORDER — DIPHENHYDRAMINE HCL 50 MG/ML IJ SOLN: 12.5000 mg | Freq: Four times a day (QID) | INTRAMUSCULAR | Status: DC | PRN

## 2017-07-19 MED ORDER — FENTANYL CITRATE (PF) 100 MCG/2ML IJ SOLN
INTRAMUSCULAR | Status: AC
  Filled 2017-07-19: qty 2

## 2017-07-19 MED ORDER — SUGAMMADEX SODIUM 200 MG/2ML IV SOLN
INTRAVENOUS | Status: DC | PRN
  Administered 2017-07-19: 200 mg via INTRAVENOUS

## 2017-07-19 MED ORDER — OXYCODONE HCL 5 MG PO TABS
5.0000 mg | ORAL_TABLET | ORAL | Status: DC | PRN
  Administered 2017-07-19 – 2017-07-20 (×3): 5 mg via ORAL
  Filled 2017-07-19 (×3): qty 1

## 2017-07-19 MED ORDER — LIDOCAINE HCL (CARDIAC) 20 MG/ML IV SOLN
INTRAVENOUS | Status: DC | PRN
  Administered 2017-07-19: 50 mg via INTRAVENOUS

## 2017-07-19 MED ORDER — DOCUSATE SODIUM 100 MG PO CAPS
100.0000 mg | ORAL_CAPSULE | Freq: Two times a day (BID) | ORAL | Status: DC
  Administered 2017-07-19 – 2017-07-20 (×2): 100 mg via ORAL
  Filled 2017-07-19 (×2): qty 1

## 2017-07-19 MED ORDER — ONDANSETRON HCL 4 MG/2ML IJ SOLN
INTRAMUSCULAR | Status: DC | PRN
  Administered 2017-07-19: 4 mg via INTRAVENOUS

## 2017-07-19 MED ORDER — FENTANYL CITRATE (PF) 100 MCG/2ML IJ SOLN
INTRAMUSCULAR | Status: DC | PRN
  Administered 2017-07-19: 25 ug via INTRAVENOUS
  Administered 2017-07-19: 50 ug via INTRAVENOUS
  Administered 2017-07-19: 25 ug via INTRAVENOUS

## 2017-07-19 MED ORDER — ACETAMINOPHEN 325 MG PO TABS: 650.0000 mg | ORAL_TABLET | ORAL | Status: DC | PRN

## 2017-07-19 MED ORDER — MEPERIDINE HCL 50 MG/ML IJ SOLN: 6.2500 mg | INTRAMUSCULAR | Status: DC | PRN

## 2017-07-19 MED ORDER — ONDANSETRON HCL 4 MG/2ML IJ SOLN
INTRAMUSCULAR | Status: AC
  Filled 2017-07-19: qty 2

## 2017-07-19 MED ORDER — DIPHENHYDRAMINE HCL 12.5 MG/5ML PO ELIX: 12.5000 mg | ORAL_SOLUTION | Freq: Four times a day (QID) | ORAL | Status: DC | PRN

## 2017-07-19 SURGICAL SUPPLY — 16 items
BAG URINE DRAINAGE (UROLOGICAL SUPPLIES) ×2 IMPLANT
BAG URO CATCHER STRL LF (MISCELLANEOUS) ×3 IMPLANT
CATH HEMA 3WAY 30CC 24FR COUDE (CATHETERS) ×2 IMPLANT
COVER FOOTSWITCH UNIV (MISCELLANEOUS) ×2 IMPLANT
ELECT REM PT RETURN 15FT ADLT (MISCELLANEOUS) ×3 IMPLANT
ELECTRODE BALL 24/28FR 5MM (UROLOGICAL SUPPLIES) ×2 IMPLANT
GLOVE BIOGEL M STRL SZ7.5 (GLOVE) ×3 IMPLANT
GOWN STRL REUS W/TWL LRG LVL3 (GOWN DISPOSABLE) ×3 IMPLANT
LOOP CUT BIPOLAR 24F LRG (ELECTROSURGICAL) ×2 IMPLANT
LOOP MONOPOLAR YLW (ELECTROSURGICAL) IMPLANT
MANIFOLD NEPTUNE II (INSTRUMENTS) ×3 IMPLANT
PACK CYSTO (CUSTOM PROCEDURE TRAY) ×3 IMPLANT
SET ASPIRATION TUBING (TUBING) IMPLANT
SYRINGE IRR TOOMEY STRL 70CC (SYRINGE) ×2 IMPLANT
TUBING CONNECTING 10 (TUBING) ×2 IMPLANT
TUBING CONNECTING 10' (TUBING) ×1

## 2017-07-19 NOTE — Anesthesia Postprocedure Evaluation (Signed)
Anesthesia Post Note  Patient: [name] Cook  Procedure(s) Performed: TRANSURETHRAL RESECTION OF BLADDER TUMOR (TURBT) (N/A Bladder)     Patient location during evaluation: PACU Anesthesia Type: General Level of consciousness: awake and alert Pain management: pain level controlled Vital Signs Assessment: post-procedure vital signs reviewed and stable Respiratory status: spontaneous breathing, nonlabored ventilation, respiratory function stable and patient connected to nasal cannula oxygen Cardiovascular status: blood pressure returned to baseline and stable Postop Assessment: no apparent nausea or vomiting Anesthetic complications: no    Last Vitals:  Vitals:   07/19/17 1500 07/19/17 1515  BP: (!) 164/82 (!) 170/93  Pulse: 72 70  Resp: 13 13  Temp:    SpO2: 100% 100%    Last Pain:  Vitals:   07/19/17 1525  TempSrc:   PainSc: Asleep                 Eragon Hammond

## 2017-07-19 NOTE — Interval H&P Note (Signed)
History and Physical Interval Note:  07/19/2017 12:09 PM  [name] Cook  has presented today for surgery, with the diagnosis of bladder mass  The various methods of treatment have been discussed with the patient and family. After consideration of risks, benefits and other options for treatment, the patient has consented to  Procedure(s): TRANSURETHRAL RESECTION OF BLADDER TUMOR (TURBT) (N/A) as a surgical intervention .  The patient's history has been reviewed, patient examined, no change in status, stable for surgery.  I have reviewed the patient's chart and labs.  Questions were answered to the patient's satisfaction.     Marton Redwood, III

## 2017-07-19 NOTE — Anesthesia Preprocedure Evaluation (Signed)
Anesthesia Evaluation  Patient identified by MRN, date of birth, ID band Patient awake    Reviewed: Allergy & Precautions, H&P , NPO status , Patient's Chart, lab work & pertinent test results, reviewed documented beta blocker date and time   Airway Mallampati: II   Neck ROM: full    Dental  (+) Poor Dentition, Teeth Intact, Edentulous Upper, Upper Dentures   Pulmonary neg pulmonary ROS, former smoker,    Pulmonary exam normal        Cardiovascular hypertension, + CAD and + Past MI  negative cardio ROS Normal cardiovascular exam Rhythm:regular Rate:Normal     Neuro/Psych negative neurological ROS  negative psych ROS   GI/Hepatic negative GI ROS, Neg liver ROS,   Endo/Other  negative endocrine ROSdiabetes, Oral Hypoglycemic Agents  Renal/GU negative Renal ROS  negative genitourinary   Musculoskeletal   Abdominal   Peds  Hematology negative hematology ROS (+)   Anesthesia Other Findings Past Medical History: 2002: Cancer ([place]) Comment: skin, removed from face Coronary artery disease Dementia mild Diabetes mellitus without complication (HCC) HOH (hard of hearing)does not wear hearing aides, but can hear you               if you speak loudly Hypertension Stented coronary artery      Past Surgical History: APPENDECTOMY CHOLECYSTECTOMY CORONARY ANGIOPLASTY WITH STENT PLACEMENT HERNIA REPAIR bilateral inguinal hernia      Reproductive/Obstetrics negative OB ROS                             Anesthesia Physical  Anesthesia Plan  ASA: III  Anesthesia Plan: General   Post-op Pain Management:    Induction: Intravenous  PONV Risk Score and Plan: 1 and Ondansetron and Treatment may vary due to age or medical condition  Airway Management Planned: Oral ETT and LMA  Additional Equipment:   Intra-op Plan:   Post-operative Plan: Extubation in OR  Informed Consent: I have  reviewed the patients History and Physical, chart, labs and discussed the procedure including the risks, benefits and alternatives for the proposed anesthesia with the patient or authorized representative who has indicated his/her understanding and acceptance.   Dental Advisory Given  Plan Discussed with: CRNA and Anesthesiologist  Anesthesia Plan Comments: ( )        Anesthesia Quick Evaluation

## 2017-07-19 NOTE — Discharge Instructions (Signed)

## 2017-07-19 NOTE — Anesthesia Procedure Notes (Signed)
Procedure Name: Intubation Date/Time: 07/19/2017 1:21 PM Performed by: Lavina Hamman, CRNA Pre-anesthesia Checklist: Patient identified, Emergency Drugs available, Suction available, Patient being monitored and Timeout performed Patient Re-evaluated:Patient Re-evaluated prior to induction Oxygen Delivery Method: Circle system utilized Preoxygenation: Pre-oxygenation with 100% oxygen Induction Type: IV induction Ventilation: Mask ventilation without difficulty Laryngoscope Size: Mac and 4 Grade View: Grade II Tube type: Oral Tube size: 7.5 mm Number of attempts: 1 Airway Equipment and Method: Stylet Placement Confirmation: ETT inserted through vocal cords under direct vision,  positive ETCO2,  CO2 detector and breath sounds checked- equal and bilateral Secured at: 22 cm Tube secured with: Tape Dental Injury: Teeth and Oropharynx as per pre-operative assessment

## 2017-07-19 NOTE — H&P (Signed)
CC/HPI: CC: Bladder mass  HPI: 84 year old male who recently fell and was hospitalized. There are no hospitalization he get a renal bladder ultrasound which revealed left-sided hydronephrosis as well as a potential bladder mass. He has a history of enlarged prostate status post TURP. He has been evaluated by his primary care physician and has been found to have potential early dementia. Yesterday his family attempted to obtain power of attorney for him but he refused. Therefore they are still in this process and currently he is making his own medical decisions. The patient is somewhat has been to pursue therapy given his age and health status. He is here today with his son-in-law.     ALLERGIES: No Allergies    MEDICATIONS: Lipitor 80 mg tablet Oral  Metoprolol Succinate 50 mg tablet, extended release 24 hr Oral     GU PSH: None     PSH Notes: Hernia Repair, Cath Stent Placement   NON-GU PSH: Cardiac Stent Placement Hernia Repair - 2008    GU PMH: Acute Cystitis/UTI, Acute cystitis without hematuria - 2014 Acute prostatitis, Prostatitis, acute - 2014 BPH w/LUTS, Benign prostatic hyperplasia with urinary obstruction - 2014 History of urolithiasis, Nephrolithiasis - 2014      PMH Notes:  1898-06-29 00:00:00 - Note: Normal Routine History And Physical Senior Citizen [phone])  2007-01-04 13:26:09 - Note: Acute Myocardial Infarction   NON-GU PMH: Personal history of other endocrine, nutritional and metabolic disease, History of diabetes mellitus - 2014 Diabetes Type 2    FAMILY HISTORY: Family Health Status Number - Runs In Family   SOCIAL HISTORY: Marital Status: Widowed Preferred Language: English Current Smoking Status: Patient does not smoke anymore.   Tobacco Use Assessment Completed: Used Tobacco in last 30 days? Does not use smokeless tobacco. Has never drank.  Does not use drugs. Drinks 1 caffeinated drink per day.     Notes: Tobacco Use, Death In The Family Mother,  Death In The Family Father, Occupation:, Marital History - Widowed   REVIEW OF SYSTEMS:    GU Review Male:   Patient reports burning/ pain with urination, get up at night to urinate, leakage of urine, and penile pain. Patient denies frequent urination, hard to postpone urination, stream starts and stops, trouble starting your stream, have to strain to urinate , and erection problems.  Gastrointestinal (Upper):   Patient denies nausea, vomiting, and indigestion/ heartburn.  Gastrointestinal (Lower):   Patient denies diarrhea and constipation.  Constitutional:   Patient denies fever, night sweats, weight loss, and fatigue.  Skin:   Patient reports skin rash/ lesion and itching.   Eyes:   Patient denies blurred vision and double vision.  Ears/ Nose/ Throat:   Patient denies sore throat and sinus problems.  Hematologic/Lymphatic:   Patient denies swollen glands and easy bruising.  Cardiovascular:   Patient denies leg swelling and chest pains.  Respiratory:   Patient denies cough and shortness of breath.  Endocrine:   Patient denies excessive thirst.  Musculoskeletal:   Patient reports back pain. Patient denies joint pain.  Neurological:   Patient denies headaches and dizziness.  Psychologic:   Patient denies depression and anxiety.   Notes: 2-3 times at night to urinate    VITAL SIGNS:      06/15/2017 11:30 AM  Weight 160 lb / 72.57 kg  Height 72 in / 182.88 cm  BP 138/74 mmHg  Pulse 68 /min  BMI 21.7 kg/m   MULTI-SYSTEM PHYSICAL EXAMINATION:    Constitutional: Plan frail man in  a wheelchair  Respiratory: No labored breathing, no use of accessory muscles.   Cardiovascular: Normal temperature, adequate perfusion of extremities  Skin: No paleness, no jaundice  Neurologic / Psychiatric: Oriented to time, oriented to place, oriented to person. No depression, no anxiety, no agitation.  Gastrointestinal: No mass, no tenderness, no rigidity, non obese abdomen.  Eyes: Normal conjunctivae.  Normal eyelids.  Musculoskeletal: Normal gait and station of head and neck.     PAST DATA REVIEWED:  Source Of History:  Patient  Records Review:   Previous Doctor Records  X-Ray Review: Renal Ultrasound: Reviewed Films. Reviewed Report. Discussed With Patient.     PROCEDURES:         Flexible Cystoscopy - 52000  Risks, benefits, and some of the potential complications of the procedure were discussed at length with the patient including infection, bleeding, voiding discomfort, urinary retention, fever, chills, sepsis, and others. All questions were answered. Informed consent was obtained. Antibiotic prophylaxis was given. Sterile technique and intraurethral analgesia were used.  Meatus:  Normal size. Normal location. Normal condition.  Urethra:  No strictures.  External Sphincter:  Normal.  Verumontanum:  Normal.  Prostate:  Obstructing. Severe hyperplasia.  Bladder Neck:  Non-obstructing.  Ureteral Orifices:  Not seen  Bladder:  Large left lateral wall tumor and possible bladder neck 12:00 tumor high-grade appearing, invasive-appearing. There were significant trabeculation. No bladder stones.      The lower urinary tract was carefully examined. The procedure was well-tolerated and without complications. Antibiotic instructions were given. Instructions were given to call the office immediately for bloody urine, difficulty urinating, urinary retention, painful or frequent urination, fever, chills, nausea, vomiting or other illness. The patient stated that he understood these instructions and would comply with them.   ASSESSMENT:      ICD-10 Details  1 GU:   Bladder tumor/neoplasm - D41.4    PLAN:           Document Letter(s):  Created for Patient: Clinical Summary         Notes:   I recommended transurethral resection of bladder tumor. He understands the risks including but not limited to bleeding, infection, injury to surrounding structures, need for additional procedures.    Currently he wishes to proceed and we will set this up. He does have some hesitation to proceed with any surgery given his age and comorbidities if he decides not to pursue any therapy or further treatment planning is certainly reasonable. My current recommendation would still be to perform a transurethral resection of bladder tumor to try to control the disease locally    Signed by Link Snuffer, III, M.D. on 06/15/17 at 12:54 PM (EST

## 2017-07-19 NOTE — Transfer of Care (Signed)
Immediate Anesthesia Transfer of Care Note  Patient: [name] Cook  Procedure(s) Performed: TRANSURETHRAL RESECTION OF BLADDER TUMOR (TURBT) (N/A Bladder)  Patient Location: PACU  Anesthesia Type:General  Level of Consciousness: awake, alert  and oriented  Airway & Oxygen Therapy: Patient Spontanous Breathing and Patient connected to face mask oxygen  Post-op Assessment: Report given to RN  Post vital signs: Reviewed and stable  Last Vitals:  Vitals:   07/19/17 1122  BP: (!) 159/81  Pulse: 70  Resp: 18  Temp: 37.3 C  SpO2: 98%    Last Pain:  Vitals:   07/19/17 1122  TempSrc: Oral         Complications: No apparent anesthesia complications

## 2017-07-19 NOTE — Op Note (Signed)
Preoperative diagnosis: 1. Bladder tumor  Postoperative diagnosis:  1. Bladder tumor-large, greater than 6 cm  Procedure:  1. Cystoscopy 2. Transurethral resection of bladder tumor-large, greater than 6 cm  Surgeon: Marton Redwood, III. M.D.  Anesthesia: General  Complications: None  EBL: Minimal  Specimens: 1. Bladder tumor  Indication: [name] Cook is a patient who recently underwent a renal bladder ultrasound that revealed left-sided hydronephrosis and a potential bladder mass.. After reviewing the management options for treatment, he elected to proceed with the above surgical procedure(s). We have discussed the potential benefits and risks of the procedure, side effects of the proposed treatment, the likelihood of the patient achieving the goals of the procedure, and any potential problems that might occur during the procedure or recuperation. Informed consent has been obtained.  Description of procedure:  The patient was taken to the operating room and general anesthesia was induced.  The patient was placed in the dorsal lithotomy position, prepped and draped in the usual sterile fashion, and preoperative antibiotics were administered. A preoperative time-out was performed.   Cystourethroscopy was performed.  The patient's urethra was examined and was normal except for evidence of prior transurethral resection with mild prostatic overgrowth.  The prostate was somewhat tortuous.  The bladder itself was severely trabeculated.  There was a large invasive appearing high-grade appearing bladder mass encompassing the majority of the left lateral wall and extending anteriorly around the bladder neck as well as over to part of the right side.  It was unable to be completely resected.  In fact, only about one third if that was resected.   The ureteral orifices were unable to be identified due to tumor growth as well as the severe trabeculation.  It is doubtful that the right ureteral  orifice was involved with tumor.  The bladder tumor on the left lateral wall was then resected utilizing loop cautery resection with the bipolar cutting loop.  Due to the large and invasive nature, minimal manipulation caused a large amount of bleeding which made visualization difficult.  I was able to carefully resect about one third of the tumor.  I did not see any obvious normal bladder mucosa in the resection bed of the majority of the tumor.  Bladder chips were collected and passed off for specimen.  Hemostasis was then achieved with the cautery as well as with the use of a roller ball and the bladder was emptied and reinspected with no significant bleeding noted at the end of the procedure.    A 24 French 3 way catheter was then placed into the bladder.  The patient appeared to tolerate the procedure well and without complications.  The patient was able to be awakened and transferred to the recovery unit in satisfactory condition.

## 2017-07-20 ENCOUNTER — Encounter (HOSPITAL_COMMUNITY): Payer: Self-pay | Admitting: Urology

## 2017-07-20 DIAGNOSIS — C678 Malignant neoplasm of overlapping sites of bladder: Secondary | ICD-10-CM | POA: Diagnosis not present

## 2017-07-20 LAB — GLUCOSE, CAPILLARY
Glucose-Capillary: 108 mg/dL — ABNORMAL HIGH (ref 65–99)
Glucose-Capillary: 198 mg/dL — ABNORMAL HIGH (ref 65–99)

## 2017-07-20 LAB — HEMOGLOBIN AND HEMATOCRIT, BLOOD
HEMATOCRIT: 33.2 % — AB (ref 39.0–52.0)
Hemoglobin: 10.7 g/dL — ABNORMAL LOW (ref 13.0–17.0)

## 2017-07-20 MED ORDER — BELLADONNA ALKALOIDS-OPIUM 16.2-60 MG RE SUPP
1.0000 | Freq: Four times a day (QID) | RECTAL | Status: DC | PRN
  Administered 2017-07-20 (×2): 1 via RECTAL
  Filled 2017-07-20 (×2): qty 1

## 2017-07-20 MED ORDER — OXYCODONE HCL 5 MG PO TABS: 5.0000 mg | ORAL_TABLET | ORAL | 0 refills | Status: DC | PRN

## 2017-07-20 NOTE — Progress Notes (Signed)
Medications administered by student RN [id number] with supervision of Clinical Instructor Janylah Belgrave MSN, RN-BC or patient's assigned RN.   

## 2017-07-20 NOTE — Progress Notes (Signed)
Attempted to call report to [facility].  Mariann Laster who answered the phone stated their phones were "acting crazy" and she would not be able to get in touch with anyone to take report.  I informed Mariann Laster that the patient had left with his son in law on the way to their facility and to please have a nurse call me for report atl [phone].  Patient switched to leg bag prior to discharge to [facility].  AVS printed out and given to son in law.

## 2017-07-20 NOTE — Progress Notes (Signed)
CSW received call from [place] at [facility] seeking information on pt for after discharge. CSW informed Narda Rutherford that pt is at [facility] and not [facility]. Janie expressed that she would reach out to [facility] CSW for further assistance on pt care. This CSW signing off.     Virgie Dad Montae Stager, MSW, [facility] [place] [facility] Clinical Social Worker [phone]

## 2017-07-20 NOTE — Discharge Summary (Signed)
Physician Discharge Summary  Patient ID: GURFATEH MCCLAIN MRN: [id number] DOB/AGE: 1926/11/19 84 y.o.  Admit date: 07/19/2017 Discharge date: 07/20/2017  Admission Diagnoses:  Discharge Diagnoses:  Active Problems:   Bladder disease   Discharged Condition: good  Hospital Course: underwent TURBT - Large, incomplete resection 2/2 size and invasiveness. Kept o/n. Following day urine light red and thin. Stable.  Consults: None  Significant Diagnostic Studies: none  Treatments: surgery: TURBT  Discharge Exam: Blood pressure 131/66, pulse 71, temperature 97.8 F (36.6 C), temperature source Axillary, resp. rate 18, height 6\' 1"  (1.854 m), weight 68.9 kg (152 lb), SpO2 96 %. General appearance: alert  Adequate perfusion NLR abd soft NTND Foley light red urine thin  Disposition: 03-Skilled Nursing Facility   Allergies as of 07/20/2017   No Known Allergies     Medication List    STOP taking these medications   aspirin EC 81 MG tablet     TAKE these medications   glipiZIDE 10 MG 24 hr tablet Commonly known as:  GLUCOTROL XL Take 10 mg daily with breakfast by mouth.   metoprolol tartrate 50 MG tablet Commonly known as:  LOPRESSOR Take 1 tablet (50 mg total) by mouth 2 (two) times daily.   MILK OF MAGNESIA CONCENTRATE PO Take 30 mLs by mouth daily as needed (for constipation).   nitroGLYCERIN 0.4 MG SL tablet Commonly known as:  NITROSTAT Place 0.4 mg under the tongue every 5 (five) minutes as needed for chest pain.   oxyCODONE 5 MG immediate release tablet Commonly known as:  Oxy IR/ROXICODONE Take 0.5 tablets (2.5 mg total) by mouth every 6 (six) hours as needed for moderate pain. What changed:  Another medication with the same name was added. Make sure you understand how and when to take each.   oxyCODONE 5 MG immediate release tablet Commonly known as:  Oxy IR/ROXICODONE Take 1 tablet (5 mg total) by mouth every 4 (four) hours as needed for moderate pain. What  changed:  You were already taking a medication with the same name, and this prescription was added. Make sure you understand how and when to take each.   rosuvastatin 20 MG tablet Commonly known as:  CRESTOR Take 20 mg by mouth daily.   senna 8.6 MG Tabs tablet Commonly known as:  SENOKOT Take 1 tablet (8.6 mg total) by mouth daily as needed for mild constipation.        Signed: Marton Redwood, III 07/20/2017, 9:49 AM

## 2017-07-20 NOTE — Progress Notes (Signed)
Spoke with pt's daughter concerning pt's discharge plans. Daughter states that she can not take him to her home. That she is using her retirement money to help her father, will have money by Friday. Explained that [facility] is not a [place] facility. Pt's current bill is $15, 480.81, which will cost close to an estimate of $5,000.00. CSW following also for placement to ALF or SNF.

## 2017-07-20 NOTE — Care Management Obs Status (Addendum)
[facility] NOTIFICATION   Patient Details  Name: MACSEN NUTTALL MRN: [id number] Date of Birth: 03-02-1927   Medicare Observation Status Notification Given:  Yes,  Over telephone with pt's daughter.   Purcell Mouton, RN 07/20/2017, 1:41 PM

## 2017-07-20 NOTE — Progress Notes (Signed)
CSW spoke with [facility] Admissions staff member Narda Rutherford regarding ability to offer patient a bed. Staff reported that SNF can offer patient a bed and that patient's family can follow up with business office about making payment at a later date.  CSW provided update to patient's daughter, patient's daughter agreeable to patient dc to [facility] SNF. Patient's daughter reported that her husband can transport patient to SNF.   Patient updated with plan and agreeable. Patient will discharge to [facility]. Patient's son-in-law will transport. CSW will continue to follow and assist with discharge planning.  Abundio Miu, [place] Social Worker [facility] Cell#: [phone]

## 2017-07-20 NOTE — Clinical Social Work Note (Signed)
Clinical Social Work Assessment  Patient Details  Name: [name] Cook MRN: [id number] Date of Birth: 1927/05/06  Date of referral:  07/20/17               Reason for consult:  Facility Placement, Discharge Planning                Permission sought to share information with:  Facility Sport and exercise psychologist, Family Supports Permission granted to share information::  Yes, Verbal Permission Granted  Name::     Building control surveyor  Agency::  [facility]  Relationship::  daughter  Contact Information:  [phone]  Housing/Transportation Living arrangements for the past 2 months:  [place] of Information:  Adult Children, Patient Patient Interpreter Needed:  None Criminal Activity/Legal Involvement Pertinent to Current Situation/Hospitalization:  No - Comment as needed Significant Relationships:  Adult Children Lives with:  Facility Resident Do you feel safe going back to the place where you live?  Yes Need for family participation in patient care:  Yes (Comment)  Care giving concerns:  Patient from [facility] ([name] term [facility]). Patient admitted for surgery, patient to return to ALF for [name] term [facility].   Social Worker assessment / plan:  CSW spoke with patient at bedside regarding discharge planning. Patient deferred to his daughter [name] Cook) to speak further about discharge planning. CSW contacted patient's daughter, patient's daughter confirmed plan for patient to discharge back to [facility]F. Patient's daughter reported that she can transport patient back to ALF.    CSW sent clinical documents to ALF for review.   CSW followed up with [facility]F, regarding patient's ability to return. Administrator [name] Cook reported that patient cannot return with catheter.   CSW contacted patient's daughter and provided update, patient's daughter reported that patient will need to go to SNF. Patient's daughter reported that patient  cannot dc to her home because she is unable to care for him. CSW explained that patient will need to private pay for SNF. Patient's daughter reported that she can private pay for SNF, but she will not have the money until Friday because it has to be wired. CSW explained that patient has been discharged and will need a plan for today. Patient's daughter reported that patient will need to stay at hospital until a place is found for patient. CSW explained to patient's daughter that patient will be charged for staying at hospital since he has been discharged, patient's daughter verbalized understanding. Patient's daughter reported that patient was at [facility] SNF recently and that patient may be able to return.   CSW will complete FL2 and follow up with [facility] about ability to offer a patient.   CSW will continue to follow and assist with discharge planning.   Employment status:    Insurance information:    PT Recommendations:  Not assessed at this time Information / Referral to community resources:  Other (Comment Required)  Patient/Family's Response to care:  Patient's daughter appreciative of CSW assistance with discharge planning.   Patient/Family's Understanding of and Emotional Response to Diagnosis, Current Treatment, and Prognosis:  Patient presented calm and deferred to daughter about discharge planning. Patient's daughter verbalized plan for patient to dc to SNF since ALF will not accept patient back with catheter.   Emotional Assessment Appearance:  Appears stated age Attitude/Demeanor/Rapport:    Affect (typically observed):    Orientation:  Oriented to Self, Oriented to Situation, Oriented to Place Alcohol / Substance use:  Not Applicable  Psych involvement (Current and /or in the community):  No (Comment)  Discharge Needs  Concerns to be addressed:  Care Coordination Readmission within the last 30 days:  No Current discharge risk:  None Barriers to Discharge:  No Barriers  Identified   [name] Medin, LCSW 07/20/2017, 11:32 AM

## 2017-07-20 NOTE — Progress Notes (Signed)
Call placed to Urology provider regarding need for signature oxycodone 5 mg IR precription. Provider stated that medication will be removed from discharge summary.

## 2017-07-20 NOTE — Clinical Social Work Placement (Signed)
Patient received and accepted bed offer at [facility] SN[facility]. Facility aware of patient's discharge and confirmed bed offer. Patient's RN can call report to [phone] Room 32054, no packet needed. CSW signing off, no other needs identified at this time.  CLINICAL SOCIAL WORK PLACEMENT  NOTE  Date:  07/20/2017  Patient Details  Name: [name] Cook MRN: [id number] Date of Birth: [birth year]  Clinical Social Work is seeking post-discharge placement for this patient at the [place] level of care (*CSW will initial, date and re-position this form in  chart as items are completed):  Yes   Patient/family provided with [place] Work Department's list of facilities offering this level of care within the geographic area requested by the patient (or if unable, by the patient's family).  Yes   Patient/family informed of their freedom to choose among providers that offer the needed level of care, that participate in Medicare, Medicaid or managed care program needed by the patient, have an available bed and are willing to accept the patient.  Yes   Patient/family informed of [facility]'s ownership interest in [facility] and [facility], as well as of the fact that they are under no obligation to receive care at these facilities.  PASRR submitted to EDS on       PASRR number received on       Existing PASRR number confirmed on 07/20/17     FL2 transmitted to all facilities in geographic area requested by pt/family on 07/20/17     FL2 transmitted to all facilities within larger geographic area on       Patient informed that his/her managed care company has contracts with or will negotiate with certain facilities, including the following:        Yes   Patient/family informed of bed offers received.  Patient chooses bed at [facility]     Physician recommends and patient chooses bed at      Patient to be transferred to [facility] on 07/20/17.  Patient to be transferred to facility by Family     Patient family notified on 07/20/17 of transfer.  Name of family member notified:  [facility]     PHYSICIAN       Additional Comment:    _______________________________________________ Burnis Medin, LCSW 07/20/2017, 3:59 PM

## 2017-07-20 NOTE — NC FL2 (Signed)
[place] LEVEL OF CARE SCREENING TOOL     IDENTIFICATION  Patient Name: [name] Cook Birthdate: [birth year] Sex: male Admission Date (Current Location): 07/19/2017  [facility] and [place] Number:  Herbalist and Address:  [facility],  [place] [address] [place]      Provider Number: [id number]  Attending Physician Name and Address:  Lucas Mallow, MD  Relative Name and Phone Number:       Current Level of Care: Hospital Recommended Level of Care: [place] Prior Approval Number:    Date Approved/Denied:   PASRR Number: [id number]  Discharge Plan: SNF    Current Diagnoses: Patient Active Problem List   Diagnosis Date Noted  . Bladder disease 07/19/2017  . Compression fracture of L2 ([place]) 05/24/2017  . Rhabdomyolysis 05/24/2017  . Mass of urinary bladder determined by ultrasound 05/24/2017  . Dementia 05/24/2017  . Compression fracture of body of thoracic vertebra ([place]) 05/17/2017  . Diabetes mellitus without complication ([place])   . Coronary artery disease   . Hypertension     Orientation RESPIRATION BLADDER Height & Weight     Self, Time, Situation, Place(gets confused about details)  Normal Indwelling catheter Weight: 152 lb (68.9 kg) Height:  6\' 1"  (185.4 cm)  BEHAVIORAL SYMPTOMS/MOOD NEUROLOGICAL BOWEL NUTRITION STATUS        Diet(regular)  AMBULATORY STATUS COMMUNICATION OF NEEDS Skin   Limited Assist Verbally Normal                       Personal Care Assistance Level of Assistance  Bathing, Feeding, Dressing Bathing Assistance: Limited assistance Feeding assistance: Independent Dressing Assistance: Limited assistance     Functional Limitations Info  Sight, Hearing, Speech Sight Info: Adequate Hearing Info: Impaired Speech Info: Adequate    SPECIAL CARE FACTORS FREQUENCY                       Contractures Contractures Info: Not present    Additional Factors Info   Code Status, Allergies Code Status Info: Full code Allergies Info: NKA           Current Medications (07/20/2017):  This is the current hospital active medication list Current Facility-Administered Medications  Medication Dose Route Frequency Provider Last Rate Last Dose  . 0.9 %  sodium chloride infusion   Intravenous Continuous Marton Redwood III, MD 50 mL/hr at 07/19/17 1734    . acetaminophen (TYLENOL) tablet 650 mg  650 mg Oral Q4H PRN Marton Redwood III, MD      . diphenhydrAMINE (BENADRYL) injection 12.5 mg  12.5 mg Intravenous Q6H PRN Marton Redwood III, MD       Or  . diphenhydrAMINE (BENADRYL) 12.5 MG/5ML elixir 12.5 mg  12.5 mg Oral Q6H PRN Marton Redwood III, MD      . docusate sodium (COLACE) capsule 100 mg  100 mg Oral BID Marton Redwood III, MD   100 mg at 07/20/17 1037  . insulin aspart (novoLOG) injection 0-15 Units  0-15 Units Subcutaneous TID WC Marton Redwood III, MD   3 Units at 07/20/17 1157  . metoprolol tartrate (LOPRESSOR) tablet 50 mg  50 mg Oral BID Marton Redwood III, MD   50 mg at 07/20/17 1037  . morphine 4 MG/ML injection 2-4 mg  2-4 mg Intravenous Q2H PRN Marton Redwood III, MD   4 mg at 07/20/17 0206  .  ondansetron (ZOFRAN) injection 4 mg  4 mg Intravenous Q4H PRN Marton Redwood III, MD      . opium-belladonna (B&O SUPPRETTES) 16.2-60 MG suppository 1 suppository  1 suppository Rectal Q6H PRN Marton Redwood III, MD   1 suppository at 07/20/17 1112  . oxyCODONE (Oxy IR/ROXICODONE) immediate release tablet 5 mg  5 mg Oral Q4H PRN Marton Redwood III, MD   5 mg at 07/20/17 1112  . rosuvastatin (CRESTOR) tablet 20 mg  20 mg Oral Daily Marton Redwood III, MD   20 mg at 07/20/17 1037  . senna (SENOKOT) tablet 8.6 mg  1 tablet Oral BID Marton Redwood III, MD   8.6 mg at 07/20/17 1037     Discharge Medications: Please see discharge summary for a list of discharge medications.  Relevant Imaging Results:  Relevant Lab Results:   Additional  Information [phone]  Alajiah Dutkiewicz L Arlie Posch, LCSW

## 2017-07-20 NOTE — NC FL2 (Signed)
[place] LEVEL OF CARE SCREENING TOOL     IDENTIFICATION  Patient Name: [name] Cook Birthdate: 1927-02-12 Sex: male Admission Date (Current Location): 07/19/2017  [facility] and [place] Number:  Herbalist and Address:  [facility],  [place] [address] [place]      Provider Number: [id number]  Attending Physician Name and Address:  Lucas Mallow, MD  Relative Name and Phone Number:       Current Level of Care: Hospital Recommended Level of Care: [place] Prior Approval Number:    Date Approved/Denied:   PASRR Number:    Discharge Plan: Other (Comment)(Assisted Living Facility)    Current Diagnoses: Patient Active Problem List   Diagnosis Date Noted  . Bladder disease 07/19/2017  . Compression fracture of L2 ([place]) 05/24/2017  . Rhabdomyolysis 05/24/2017  . Mass of urinary bladder determined by ultrasound 05/24/2017  . Dementia 05/24/2017  . Compression fracture of body of thoracic vertebra ([place]) 05/17/2017  . Diabetes mellitus without complication ([place])   . Coronary artery disease   . Hypertension     Orientation RESPIRATION BLADDER Height & Weight     Self, Time, Situation  Normal Indwelling catheter Weight: 152 lb (68.9 kg) Height:  6\' 1"  (185.4 cm)  BEHAVIORAL SYMPTOMS/MOOD NEUROLOGICAL BOWEL NUTRITION STATUS        Diet(regular)  AMBULATORY STATUS COMMUNICATION OF NEEDS Skin   Limited Assist Verbally Normal                       Personal Care Assistance Level of Assistance  Bathing, Feeding, Dressing Bathing Assistance: Limited assistance Feeding assistance: Independent Dressing Assistance: Limited assistance     Functional Limitations Info  Sight, Hearing, Speech Sight Info: Adequate Hearing Info: Impaired Speech Info: Adequate    SPECIAL CARE FACTORS FREQUENCY                       Contractures      Additional Factors Info  Code Status, Allergies Code Status  Info: Full Code Allergies Info: NKA              Discharge Medications: Medication List     STOP taking these medications   aspirin EC 81 MG tablet     TAKE these medications   glipiZIDE 10 MG 24 hr tablet Commonly known as:  GLUCOTROL XL Take 10 mg daily with breakfast by mouth.   metoprolol tartrate 50 MG tablet Commonly known as:  LOPRESSOR Take 1 tablet (50 mg total) by mouth 2 (two) times daily.   MILK OF MAGNESIA CONCENTRATE PO Take 30 mLs by mouth daily as needed (for constipation).   nitroGLYCERIN 0.4 MG SL tablet Commonly known as:  NITROSTAT Place 0.4 mg under the tongue every 5 (five) minutes as needed for chest pain.   oxyCODONE 5 MG immediate release tablet Commonly known as:  Oxy IR/ROXICODONE Take 0.5 tablets (2.5 mg total) by mouth every 6 (six) hours as needed for moderate pain. What changed:  Another medication with the same name was added. Make sure you understand how and when to take each.   oxyCODONE 5 MG immediate release tablet Commonly known as:  Oxy IR/ROXICODONE Take 1 tablet (5 mg total) by mouth every 4 (four) hours as needed for moderate pain. What changed:  You were already taking a medication with the same name, and this prescription was added. Make sure you understand how and  when to take each.   rosuvastatin 20 MG tablet Commonly known as:  CRESTOR Take 20 mg by mouth daily.   senna 8.6 MG Tabs tablet Commonly known as:  SENOKOT Take 1 tablet (8.6 mg total) by mouth daily as needed for mild constipation.       Relevant Imaging Results:  Relevant Lab Results:   Additional Information [phone]  [name], LCSW

## 2017-07-20 NOTE — Progress Notes (Signed)
Nurse from [facility] contacted this RN and report was given on [name] Cook.  Patient has already arrived at the facility.

## 2017-07-28 ENCOUNTER — Other Ambulatory Visit: Payer: Self-pay | Admitting: *Deleted

## 2017-07-28 NOTE — Patient Outreach (Signed)
[facility]) Care Management  07/28/2017  [name] Cook 08/13/1926 [id number]   Met with SW at facility.  She reports patient is from [place].  Patient has worsening condition and may need to be LTC.  SW has had conversations with family about patient needs and they are to make a decision soon about  Return to ALF or LTC.   Plan to sign off at this time as patient will not be discharging to home so no community care management needs.  [name]. [name], RN, BSN, [place] [phone]) Business Cell  [phone]) Toll Free Office

## 2017-08-02 ENCOUNTER — Encounter: Payer: Self-pay | Admitting: Radiation Oncology

## 2017-08-03 ENCOUNTER — Encounter: Payer: Self-pay | Admitting: Oncology

## 2017-08-03 ENCOUNTER — Telehealth: Payer: Self-pay | Admitting: Oncology

## 2017-08-03 NOTE — Telephone Encounter (Signed)
Appt has been scheduled for the pt to see Dr. Alen Blew on 2/28 at 2pm. Letter mailed to the pt.

## 2017-08-19 ENCOUNTER — Ambulatory Visit
Admission: RE | Admit: 2017-08-19 | Discharge: 2017-08-19 | Disposition: A | Discharge status: Home / Self Care | Payer: Medicare Other | Source: Ambulatory Visit | Attending: Radiation Oncology | Admitting: Radiation Oncology

## 2017-08-19 ENCOUNTER — Other Ambulatory Visit: Payer: Self-pay

## 2017-08-19 ENCOUNTER — Encounter: Payer: Self-pay | Admitting: Radiation Oncology

## 2017-08-19 DIAGNOSIS — I10 Essential (primary) hypertension: Secondary | ICD-10-CM | POA: Diagnosis not present

## 2017-08-19 DIAGNOSIS — K769 Liver disease, unspecified: Secondary | ICD-10-CM | POA: Insufficient documentation

## 2017-08-19 DIAGNOSIS — Z87891 Personal history of nicotine dependence: Secondary | ICD-10-CM | POA: Diagnosis not present

## 2017-08-19 DIAGNOSIS — Z85828 Personal history of other malignant neoplasm of skin: Secondary | ICD-10-CM | POA: Insufficient documentation

## 2017-08-19 DIAGNOSIS — Z79899 Other long term (current) drug therapy: Secondary | ICD-10-CM | POA: Diagnosis not present

## 2017-08-19 DIAGNOSIS — I252 Old myocardial infarction: Secondary | ICD-10-CM | POA: Insufficient documentation

## 2017-08-19 DIAGNOSIS — K6289 Other specified diseases of anus and rectum: Secondary | ICD-10-CM | POA: Insufficient documentation

## 2017-08-19 DIAGNOSIS — E119 Type 2 diabetes mellitus without complications: Secondary | ICD-10-CM | POA: Diagnosis not present

## 2017-08-19 DIAGNOSIS — M899 Disorder of bone, unspecified: Secondary | ICD-10-CM | POA: Diagnosis not present

## 2017-08-19 DIAGNOSIS — K5900 Constipation, unspecified: Secondary | ICD-10-CM | POA: Insufficient documentation

## 2017-08-19 DIAGNOSIS — C678 Malignant neoplasm of overlapping sites of bladder: Secondary | ICD-10-CM

## 2017-08-19 DIAGNOSIS — C679 Malignant neoplasm of bladder, unspecified: Secondary | ICD-10-CM | POA: Insufficient documentation

## 2017-08-19 DIAGNOSIS — F0390 Unspecified dementia without behavioral disturbance: Secondary | ICD-10-CM | POA: Diagnosis not present

## 2017-08-19 DIAGNOSIS — I2510 Atherosclerotic heart disease of native coronary artery without angina pectoris: Secondary | ICD-10-CM | POA: Insufficient documentation

## 2017-08-19 DIAGNOSIS — E279 Disorder of adrenal gland, unspecified: Secondary | ICD-10-CM | POA: Diagnosis not present

## 2017-08-19 DIAGNOSIS — R197 Diarrhea, unspecified: Secondary | ICD-10-CM | POA: Diagnosis not present

## 2017-08-19 DIAGNOSIS — Z7984 Long term (current) use of oral hypoglycemic drugs: Secondary | ICD-10-CM | POA: Diagnosis not present

## 2017-08-19 DIAGNOSIS — C61 Malignant neoplasm of prostate: Secondary | ICD-10-CM

## 2017-08-19 HISTORY — DX: Malignant neoplasm of bladder, unspecified: C67.9

## 2017-08-19 HISTORY — DX: Unspecified malignant neoplasm of skin, unspecified: C44.90

## 2017-08-19 MED ORDER — HYDROCORTISONE ACETATE 25 MG RE SUPP: 25.0000 mg | Freq: Two times a day (BID) | RECTAL | 0 refills | Status: DC | PRN

## 2017-08-19 NOTE — Progress Notes (Signed)
See progress note under physician encounter. 

## 2017-08-19 NOTE — Progress Notes (Addendum)
GU Location of Tumor / Histology: muscle invasive high grade bladder cancer  [name] Cook fell at home 05/16/2017. He was found the next day by family and taken to the ER for evaluation. A scan was done to evaluate back pain then, an ultrasound. The ultrasound revealed evidence of a large bladder mass as well as left sided hydronephrosis.     Past/Anticipated interventions by urology, if any: cystoscopy on 06/15/17, cystoscopy TURBT on 07/19/2017, discharged to a skilled nursing facility, CT scan for staging (done 08/09/2017)  Past/Anticipated interventions by medical oncology, if any: scheduled for consult with Dr. Alen Blew on 08/26/2017  Weight changes, if any: yes, 20 lb in 5-6 months  Bowel/Bladder complaints, if any: Denies hematuria.    Nausea/Vomiting, if any: no   Pain issues, if any:  Aching rectal pain from frequent bowel movements  SAFETY ISSUES:  Prior radiation? no  Pacemaker/ICD? no  Possible current pregnancy? no  Is the patient on methotrexate? no  Current Complaints / other details:  84 year old male. Widowed. Patient scheduled to follow up with Dr. Gloriann Loan at [facility] on 09/17/2017. Patient resides at [facility] presently. Urinary catheter was removed a few weeks ago per patient's daughter. Patient reports he is wearing a depends because he is incontinent of urine. Daughter reports cognition and memory "got much worse after the fall and surgery."

## 2017-08-19 NOTE — Progress Notes (Signed)
Radiation Oncology         [phone] ________________________________  Initial Outpatient Consultation  Name: [name] Cook MRN: [id number]  Date of Service: 08/19/2017 DOB: 02/27/1927  CC:Chesley Noon, MD  Lucas Mallow, MD   REFERRING PHYSICIAN: Lucas Mallow, MD  DIAGNOSIS: 84 y.o. 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 Malignant neoplasm of prostate ([place]) C61   2. Malignant neoplasm of urinary bladder, unspecified site [facility]) C67.9     HISTORY OF PRESENT ILLNESS: [name] Cook is a 84 y.o. male seen at the request of Dr. Gloriann Loan for muscle invasive high grade bladder cancer. He has a history of BPH with LUTS status post TURP. He presented to the ED on 05/17/2017 after a fall complaining of lower back pain. An x-ray of his lumbar spine showed an L2 compression fracture. He was also found to have a UTI that was treated with ceftriaxone. A follow-up renal ultrasound on 05/18/2017 showed a 3.9 cm soft tissue mass lesion within the lumen of the bladder as well as left-sided hydronephrosis. He was referred to Dr. Gloriann Loan on 06/15/2017 for further evaluation with office cytoscopy which showed a large left lateral wall tumor, a possible bladder neck tumor at 12:00 that was high-grade, invasive-appearing, and significant trabeculation. He underwent TURBT on 07/19/2017 with pathology revealing high grade invasive urothelial carcinoma involving the muscularis propria. A disease staging CT A/P was performed on 08/09/2017 showing the known left bladder mass involving the left ureteral orifice. Additionally, there is a 2.9 cm left adrenal mass suspicious for metastasis and at least two heaptic lesions measuring up to 2.5 cm, concerning for hepatic metastasis.  The patient reviewed the pathology results with his urologist and has been referred today to discuss potential radiation therapy options  as he is not felt to be a good surgical candidate. He is also scheduled for consultation with Dr. Alen Blew on 08/26/2017, though he is not felt to be a good candidate for chemotherapy either. He is accompanied today by his daughter. Per the patient's daughter, he has continued to have progressive cognitive decline since the time of his most recent hospitalization and bladder procedure on January 21st.  He also continues to complain of rectal pain/pressure/achiness.  This was initially thought to be secondary to significant constipation noted on recent CT scan but the constipation has since been resolved and the pain/pressure persists.  On further review of the CT scan, Dr. Tammi Klippel notes a lytic lesion in the sacrum which likely correlates with the patient's symptoms.   He also continues with intermittent gross hematuria without clots since the time of his TURBT.  He denies dysuria, fever or chills.  He has increased frequency and urgency with urge incontience.  He wears depends 24/7, changing frequently throughout the day.  He is currently residing at [facility] for rehab and will be transitioning to assisted living at that facility within the next week.  PREVIOUS RADIATION THERAPY: No  PAST MEDICAL HISTORY:  Past Medical History:  Diagnosis Date  . Bladder cancer ([place])   . Coronary artery disease   . Dementia    mild  . Diabetes mellitus without complication ([place])   . HOH (hard of hearing)    does not wear hearing aides, but can hear you if you speak loudly  . Hypertension   . Skin cancer 2002   on face  .  STEMI involving right coronary artery ([place]) 2008   S/P 2.5 x 20 mm Taxus DES RCA, med rx for 30% LAD, 80% OM1 & dCFX, EF 60%      PAST SURGICAL HISTORY: Past Surgical History:  Procedure Laterality Date  . APPENDECTOMY    . CATARACT EXTRACTION W/PHACO Left 06/10/2016   Procedure: CATARACT EXTRACTION PHACO AND INTRAOCULAR LENS PLACEMENT (IOC);  Surgeon: Estill Cotta, MD;  Location:  [facility] ORS;  Service: Ophthalmology;  Laterality: Left;  Lot # [id number] H [place]: 01:04.0 AP% 25.5 CDE: 29.46  . CHOLECYSTECTOMY    . CORONARY ANGIOPLASTY WITH STENT PLACEMENT  2008   2.5 x 20 mm Taxus DES to 95% RCA, 80% dCFX, 80% OM1, 30% LAD > med rx, EF 60%  . EYE SURGERY    . HERNIA REPAIR     bilateral inguinal hernia  . PROSTATE SURGERY    . TRANSURETHRAL RESECTION OF BLADDER TUMOR N/A 07/19/2017   Procedure: TRANSURETHRAL RESECTION OF BLADDER TUMOR (TURBT);  Surgeon: Lucas Mallow, MD;  Location: [facility] ORS;  Service: Urology;  Laterality: N/A;    FAMILY HISTORY:  Family History  Problem Relation Age of Onset  . Cancer Neg Hx     SOCIAL HISTORY:  Social History   Socioeconomic History  . Marital status: Widowed    Spouse name: Not on file  . Number of children: Not on file  . Years of education: Not on file  . Highest education level: Not on file  Social Needs  . Financial resource strain: Not on file  . Food insecurity - worry: Not on file  . Food insecurity - inability: Not on file  . Transportation needs - medical: Not on file  . Transportation needs - non-medical: Not on file  Occupational History  . Occupation: Retired Cabin crew professor  Tobacco Use  . Smoking status: Former Smoker    Packs/day: 0.50    Years: 20.00    Pack years: 10.00    Types: Cigarettes    Last attempt to quit: 06/29/1968    Years since quitting: 49.1  . Smokeless tobacco: Never Used  Substance and Sexual Activity  . Alcohol use: No  . Drug use: No  . Sexual activity: No  Other Topics Concern  . Not on file  Social History Narrative   Patient resides at [facility]. Patient using a wheelchair.    ALLERGIES: Patient has no known allergies.  MEDICATIONS:  Current Outpatient Medications  Medication Sig Dispense Refill  . acetaminophen (TYLENOL) 325 MG tablet Take 650 mg by mouth every 6 (six) hours as needed.    Marland Kitchen artificial tears (LACRILUBE) OINT ophthalmic ointment Place into  both eyes.    Marland Kitchen docusate sodium (COLACE) 100 MG capsule Take 100 mg by mouth 2 (two) times daily.    Marland Kitchen glipiZIDE (GLUCOTROL XL) 10 MG 24 hr tablet Take 10 mg daily with breakfast by mouth.    . Magnesium Hydroxide (MILK OF MAGNESIA CONCENTRATE PO) Take 30 mLs by mouth daily as needed (for constipation).    . metoprolol tartrate (LOPRESSOR) 50 MG tablet Take 1 tablet (50 mg total) by mouth 2 (two) times daily. 60 tablet 11  . Multiple Vitamins-Minerals (PRESERVISION AREDS PO) Take by mouth.    . oxyCODONE (OXY IR/ROXICODONE) 5 MG immediate release tablet Take 0.5 tablets (2.5 mg total) by mouth every 6 (six) hours as needed for moderate pain. 10 tablet 0  . polyethylene glycol (MIRALAX / GLYCOLAX) packet Take 17 g by mouth daily.    Marland Kitchen  rosuvastatin (CRESTOR) 20 MG tablet Take 20 mg by mouth daily.     Marland Kitchen senna (SENOKOT) 8.6 MG TABS tablet Take 1 tablet (8.6 mg total) by mouth daily as needed for mild constipation. 10 each 0  . hydrocortisone (ANUSOL-HC) 25 MG suppository Place 1 suppository (25 mg total) rectally 2 (two) times daily as needed for hemorrhoids (rectal pressure/pain). 24 suppository 0  . nitroGLYCERIN (NITROSTAT) 0.4 MG SL tablet Place 0.4 mg under the tongue every 5 (five) minutes as needed for chest pain.     No current facility-administered medications for this encounter.     REVIEW OF SYSTEMS:  On review of systems, the patient reports that he is doing well overall. He denies any chest pain, shortness of breath, cough, fevers, chills, night sweats, or unintended weight changes. He reports constipation, managed with Miralax, and aching rectal pain/pressure from frequent bowel movements for the past month. He denies any history of hemorrhoids. He denies abdominal pain, nausea or vomiting. He reports intermittent gross hematuria without clots since the time of TURBT. He denies any urinary retention, strain or incomplete bladder emptying. He reports chronic low back pain. A complete review  of systems is obtained and is otherwise negative.   PHYSICAL EXAM:  Wt Readings from Last 3 Encounters:  08/19/17 160 lb (72.6 kg)  07/19/17 152 lb (68.9 kg)  05/24/17 155 lb 10.4 oz (70.6 kg)   Temp Readings from Last 3 Encounters:  08/19/17 98.3 F (36.8 C) (Oral)  07/20/17 98.5 F (36.9 C) (Oral)  05/24/17 97.9 F (36.6 C) (Oral)   BP Readings from Last 3 Encounters:  08/19/17 (!) 174/79  07/20/17 (!) 148/60  05/24/17 132/68   Pulse Readings from Last 3 Encounters:  08/19/17 66  07/20/17 72  05/24/17 86   Pain Assessment Pain Score: 0-No pain/10  In general this is a frail elderly Caucasian man in a wheelchair. He is alert and oriented x4, in no acute distress, and appropriate throughout the examination. HEENT reveals that the patient is normocephalic, atraumatic. Skin is intact without any evidence of gross lesions. Cardiovascular exam reveals a regular rate and rhythm, no clicks rubs or murmurs are auscultated. Chest is clear to auscultation bilaterally. Lymphatic assessment is performed and does not reveal any adenopathy in the supraclavicular or axillary chains. Abdomen has active bowel sounds in all quadrants and is intact. The abdomen is soft, non tender, non distended. Lower extremities are negative for pretibial pitting edema, deep calf tenderness, cyanosis or clubbing.   KPS = 50  100 - Normal; no complaints; no evidence of disease. 90   - Able to carry on normal activity; minor signs or symptoms of disease. 80   - Normal activity with effort; some signs or symptoms of disease. 64   - Cares for self; unable to carry on normal activity or to do active work. 60   - Requires occasional assistance, but is able to care for most of his personal needs. 50   - Requires considerable assistance and frequent medical care. 15   - Disabled; requires special care and assistance. 72   - Severely disabled; hospital admission is indicated although death not imminent. 12   - Very  sick; hospital admission necessary; active supportive treatment necessary. 10   - Moribund; fatal processes progressing rapidly. 0     - Dead  Karnofsky DA, Abelmann WH, Craver LS and Burchenal [facility] [phone]) The use of the nitrogen mustards in the palliative treatment of carcinoma: with particular reference  to bronchogenic carcinoma Cancer 1 634-56  LABORATORY DATA:  Lab Results  Component Value Date   WBC 9.7 07/19/2017   HGB 10.7 (L) 07/20/2017   HCT 33.2 (L) 07/20/2017   MCV 92.3 07/19/2017   PLT 366 07/19/2017   Lab Results  Component Value Date   NA 136 07/19/2017   K 3.9 07/19/2017   CL 99 (L) 07/19/2017   CO2 28 07/19/2017   Lab Results  Component Value Date   ALT 28 05/18/2017   AST 56 (H) 05/18/2017   ALKPHOS 75 05/18/2017   BILITOT 1.7 (H) 05/18/2017     RADIOGRAPHY: No results found.    IMPRESSION/PLAN: 1. 84 y.o. 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 Today, we talked to the patient and family about the findings and work-up thus far.  We discussed the natural history of metastatic bladder cancer and general treatment, highlighting the role of palliative radiotherapy in the management.  We discussed the available radiation techniques, and focused on the details of logistics and delivery.  The recommendation is for a course of 2 weeks of daily radiotherapy treatments to the bladder and painful sacral lesion.  We reviewed the anticipated acute and late sequelae associated with radiation in this setting.  The patient was encouraged to ask questions that we answered to the best of our ability. The patient would like to proceed with palliative radiation to the bladder and sacral lesion and is scheduled for CT simulation on Monday February 25th at 11:00 AM in anticipation of beginning treatments on Tuesday, 08/24/17.  2. Rectal pain/pressure.  This  is felt most likely secondary to the lytic appearing sacral lesion on recent CT scan.  However, he has also suffered with constipation and diarrhea recently so we have provided a Rx for Anusol suppositories to ease any rectal irritation.  We are planning to treat the sacral lesion with a course of 10 daily radiotherapy treatments delivered over 2 weeks.  We spent more than 50% of today's time face to face with the patient in counseling and/or coordination of care.   Nicholos Johns, PA-C    Tyler Pita, MD  [facility] Direct Dial: [phone]  Fax: [phone] [email or web address]  Skype  [email or web address]  This document serves as a record of services personally performed by Tyler Pita, MD and Freeman Caldron, PA-C. It was created on their behalf by Rae Lips, a trained medical scribe. The creation of this record is based on the scribe's personal observations and the providers' statements to them. This document has been checked and approved by the attending providers.

## 2017-08-23 ENCOUNTER — Ambulatory Visit
Admission: RE | Admit: 2017-08-23 | Discharge: 2017-08-23 | Disposition: A | Discharge status: Home / Self Care | Payer: Medicare Other | Source: Ambulatory Visit | Attending: Radiation Oncology | Admitting: Radiation Oncology

## 2017-08-23 ENCOUNTER — Telehealth: Payer: Self-pay | Admitting: Oncology

## 2017-08-23 DIAGNOSIS — Z510 Encounter for antineoplastic radiation therapy: Secondary | ICD-10-CM | POA: Insufficient documentation

## 2017-08-23 DIAGNOSIS — C672 Malignant neoplasm of lateral wall of bladder: Secondary | ICD-10-CM | POA: Diagnosis not present

## 2017-08-23 DIAGNOSIS — C7951 Secondary malignant neoplasm of bone: Secondary | ICD-10-CM | POA: Diagnosis not present

## 2017-08-23 NOTE — Telephone Encounter (Signed)
Returned call to patients daughter she was requesting D/T/Md name for appointment on 2/28 per VM recv'd 2/25

## 2017-08-23 NOTE — Progress Notes (Signed)
  Radiation Oncology         [phone] ________________________________  Name: [name] Cook MRN: [id number]  Date: 08/23/2017  DOB: 1927/01/15  SIMULATION AND TREATMENT PLANNING NOTE    ICD-10-CM   1. Malignant neoplasm of lateral wall of urinary bladder (HCC) C67.2     DIAGNOSIS:  84 y.o. 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  Identity was confirmed.  All relevant records and images related to the planned course of therapy were reviewed.  The patient freely provided informed written consent to proceed with treatment after reviewing the details related to the planned course of therapy. The consent form was witnessed and verified by the simulation staff.  Then, the patient was set-up in a stable reproducible  supine position for radiation therapy.  CT images were obtained.  Surface markings were placed.  The CT images were loaded into the planning software.  Then the target and avoidance structures were contoured.  Treatment planning then occurred.  The radiation prescription was entered and confirmed.  Then, I designed and supervised the construction of a total of 1 medically necessary complex treatment device as body fix and others will be needed at planning as MLCs.  I have requested : Isodose Plan.   PLAN:  The patient will receive 30 Gy in 10 fractions.  ________________________________  Sheral Apley Tammi Klippel, M.D.

## 2017-08-25 ENCOUNTER — Ambulatory Visit
Admission: RE | Admit: 2017-08-25 | Discharge: 2017-08-25 | Disposition: A | Discharge status: Home / Self Care | Payer: Medicare Other | Source: Ambulatory Visit | Attending: Radiation Oncology | Admitting: Radiation Oncology

## 2017-08-25 DIAGNOSIS — C7951 Secondary malignant neoplasm of bone: Secondary | ICD-10-CM | POA: Diagnosis not present

## 2017-08-25 DIAGNOSIS — C672 Malignant neoplasm of lateral wall of bladder: Secondary | ICD-10-CM | POA: Diagnosis not present

## 2017-08-25 DIAGNOSIS — Z510 Encounter for antineoplastic radiation therapy: Secondary | ICD-10-CM | POA: Diagnosis present

## 2017-08-26 ENCOUNTER — Ambulatory Visit
Admission: RE | Admit: 2017-08-26 | Discharge: 2017-08-26 | Disposition: A | Discharge status: Home / Self Care | Payer: Medicare Other | Source: Ambulatory Visit | Attending: Radiation Oncology | Admitting: Radiation Oncology

## 2017-08-26 ENCOUNTER — Telehealth: Payer: Self-pay

## 2017-08-26 ENCOUNTER — Inpatient Hospital Stay: Payer: Medicare Other | Attending: Oncology | Admitting: Oncology

## 2017-08-26 VITALS — BP 194/101 | HR 76 | Temp 97.8°F | Resp 17 | Ht 73.0 in

## 2017-08-26 DIAGNOSIS — Z87891 Personal history of nicotine dependence: Secondary | ICD-10-CM

## 2017-08-26 DIAGNOSIS — C7970 Secondary malignant neoplasm of unspecified adrenal gland: Secondary | ICD-10-CM

## 2017-08-26 DIAGNOSIS — N1330 Unspecified hydronephrosis: Secondary | ICD-10-CM | POA: Diagnosis not present

## 2017-08-26 DIAGNOSIS — C679 Malignant neoplasm of bladder, unspecified: Secondary | ICD-10-CM | POA: Diagnosis not present

## 2017-08-26 DIAGNOSIS — C787 Secondary malignant neoplasm of liver and intrahepatic bile duct: Secondary | ICD-10-CM | POA: Diagnosis not present

## 2017-08-26 DIAGNOSIS — C7951 Secondary malignant neoplasm of bone: Secondary | ICD-10-CM | POA: Diagnosis not present

## 2017-08-26 DIAGNOSIS — N329 Bladder disorder, unspecified: Secondary | ICD-10-CM

## 2017-08-26 NOTE — Progress Notes (Signed)
Reason for Referral: Bladder cancer.  HPI: 84 year old gentleman native of [place] but has been living in [place].  He is residing in a skilled nursing facility after recent hospitalizations in November 2018.  At that time he presented with a fall and found to have an L2 vertebral fracture and abnormal urine analysis.  His workup revealed a bladder mass and subsequently underwent a TURBT on July 20, 2017 by Dr. Gloriann Loan.  He will found to have a large mass and underwent incomplete resection at that time.  The final pathology showed high-grade muscle invasive urothelial carcinoma.  Staging workup including CT scan of the abdomen and pelvis showed areas of metastasis including sacral involvement, adrenal metastasis and possible liver metastasis.  Clinically, he reports symptoms of rectal pain and constipation.  He is quite debilitated at this time with limited mobility and predominantly in a wheelchair and uses a walker and his skilled nursing facility.  His appetite is been reasonable and able to swallow without any dysphagia or odontophagia.  He denies any flank pain or hematuria.  He does not report any headaches, blurry vision, syncope or seizures.  He does have cognitive decline with memory issues.  Does not report any fevers, chills or sweats.  Does not report any cough, wheezing or hemoptysis.  Does not report any chest pain, palpitation, orthopnea or leg edema.  Does not report any nausea, vomiting or abdominal pain.  Does not report any constipation or diarrhea.  Does not report any skeletal complaints.    Does not report frequency, urgency or hematuria.  Does not report any skin rashes or lesions. Does not report any heat or cold intolerance.  Does not report any lymphadenopathy or petechiae.  Does not report any anxiety or depression.  Remaining review of systems is negative.    Past Medical History:  Diagnosis Date  . Bladder cancer ([place])   . Coronary artery disease   . Dementia     mild  . Diabetes mellitus without complication ([place])   . HOH (hard of hearing)    does not wear hearing aides, but can hear you if you speak loudly  . Hypertension   . Skin cancer 2002   on face  . STEMI involving right coronary artery ([place]) 2008   S/P 2.5 x 20 mm Taxus DES RCA, med rx for 30% LAD, 80% OM1 & dCFX, EF 60%  :  Past Surgical History:  Procedure Laterality Date  . APPENDECTOMY    . CATARACT EXTRACTION W/PHACO Left 06/10/2016   Procedure: CATARACT EXTRACTION PHACO AND INTRAOCULAR LENS PLACEMENT (IOC);  Surgeon: Estill Cotta, MD;  Location: [facility] ORS;  Service: Ophthalmology;  Laterality: Left;  Lot # [id number] H [place]: 01:04.0 AP% 25.5 CDE: 29.46  . CHOLECYSTECTOMY    . CORONARY ANGIOPLASTY WITH STENT PLACEMENT  2008   2.5 x 20 mm Taxus DES to 95% RCA, 80% dCFX, 80% OM1, 30% LAD > med rx, EF 60%  . EYE SURGERY    . HERNIA REPAIR     bilateral inguinal hernia  . PROSTATE SURGERY    . TRANSURETHRAL RESECTION OF BLADDER TUMOR N/A 07/19/2017   Procedure: TRANSURETHRAL RESECTION OF BLADDER TUMOR (TURBT);  Surgeon: Lucas Mallow, MD;  Location: [facility] ORS;  Service: Urology;  Laterality: N/A;  :   Current Outpatient Medications:  .  acetaminophen (TYLENOL) 325 MG tablet, Take 650 mg by mouth every 6 (six) hours as needed., Disp: , Rfl:  .  artificial tears (LACRILUBE) OINT ophthalmic ointment, Place  into both eyes., Disp: , Rfl:  .  docusate sodium (COLACE) 100 MG capsule, Take 100 mg by mouth 2 (two) times daily., Disp: , Rfl:  .  glipiZIDE (GLUCOTROL XL) 10 MG 24 hr tablet, Take 10 mg daily with breakfast by mouth., Disp: , Rfl:  .  hydrocortisone (ANUSOL-HC) 25 MG suppository, Place 1 suppository (25 mg total) rectally 2 (two) times daily as needed for hemorrhoids (rectal pressure/pain)., Disp: 24 suppository, Rfl: 0 .  Magnesium Hydroxide (MILK OF MAGNESIA CONCENTRATE PO), Take 30 mLs by mouth daily as needed (for constipation)., Disp: , Rfl:  .  metoprolol tartrate  (LOPRESSOR) 50 MG tablet, Take 1 tablet (50 mg total) by mouth 2 (two) times daily., Disp: 60 tablet, Rfl: 11 .  Multiple Vitamins-Minerals (PRESERVISION AREDS PO), Take by mouth., Disp: , Rfl:  .  nitroGLYCERIN (NITROSTAT) 0.4 MG SL tablet, Place 0.4 mg under the tongue every 5 (five) minutes as needed for chest pain., Disp: , Rfl:  .  oxyCODONE (OXY IR/ROXICODONE) 5 MG immediate release tablet, Take 0.5 tablets (2.5 mg total) by mouth every 6 (six) hours as needed for moderate pain., Disp: 10 tablet, Rfl: 0 .  polyethylene glycol (MIRALAX / GLYCOLAX) packet, Take 17 g by mouth daily., Disp: , Rfl:  .  rosuvastatin (CRESTOR) 20 MG tablet, Take 20 mg by mouth daily. , Disp: , Rfl:  .  senna (SENOKOT) 8.6 MG TABS tablet, Take 1 tablet (8.6 mg total) by mouth daily as needed for mild constipation., Disp: 10 each, Rfl: 0:  No Known Allergies:  Family History  Problem Relation Age of Onset  . Cancer Neg Hx   :  Social History   Socioeconomic History  . Marital status: Widowed    Spouse name: Not on file  . Number of children: Not on file  . Years of education: Not on file  . Highest education level: Not on file  Social Needs  . Financial resource strain: Not on file  . Food insecurity - worry: Not on file  . Food insecurity - inability: Not on file  . Transportation needs - medical: Not on file  . Transportation needs - non-medical: Not on file  Occupational History  . Occupation: Retired Cabin crew professor  Tobacco Use  . Smoking status: Former Smoker    Packs/day: 0.50    Years: 20.00    Pack years: 10.00    Types: Cigarettes    Last attempt to quit: 06/29/1968    Years since quitting: 49.1  . Smokeless tobacco: Never Used  Substance and Sexual Activity  . Alcohol use: No  . Drug use: No  . Sexual activity: No  Other Topics Concern  . Not on file  Social History Narrative   Patient resides at [facility]. Patient using a wheelchair.  :  Pertinent items are noted in  HPI.  Exam: Blood pressure (!) 194/101, pulse 76, temperature 97.8 F (36.6 C), temperature source Oral, resp. rate 17, height 6\' 1"  (1.854 m), SpO2 99 %.   ECOG 3 General appearance: Frail gentleman appeared without distress today. Head: atraumatic without any abnormalities. Eyes: conjunctivae/corneas clear. PERRL.  Sclera anicteric. Throat: lips, mucosa, and tongue normal; without oral thrush or ulcers. Resp: clear to auscultation bilaterally without rhonchi, wheezes or dullness to percussion. Cardio: regular rate and rhythm, S1, S2 normal, no murmur, click, rub or gallop GI: soft, non-tender; bowel sounds normal; no masses,  no organomegaly Skin: Skin color, texture, turgor normal. No rashes or lesions Lymph nodes:  Cervical, supraclavicular, and axillary nodes normal. Neurologic: Grossly normal without any motor, sensory or deep tendon reflexes. Musculoskeletal: No joint deformity or effusion.  CBC    Component Value Date/Time   WBC 9.7 07/19/2017 1130   RBC 4.27 07/19/2017 1130   HGB 10.7 (L) 07/20/2017 0423   HCT 33.2 (L) 07/20/2017 0423   PLT 366 07/19/2017 1130   MCV 92.3 07/19/2017 1130   MCH 29.7 07/19/2017 1130   MCHC 32.2 07/19/2017 1130   RDW 13.6 07/19/2017 1130   LYMPHSABS 1.1 05/17/2017 1204   MONOABS 0.7 05/17/2017 1204   EOSABS 0.0 05/17/2017 1204   BASOSABS 0.0 05/17/2017 1204     Chemistry      Component Value Date/Time   NA 136 07/19/2017 1130   K 3.9 07/19/2017 1130   CL 99 (L) 07/19/2017 1130   CO2 28 07/19/2017 1130   BUN 20 07/19/2017 1130   CREATININE 1.40 (H) 07/19/2017 1130      Component Value Date/Time   CALCIUM 9.1 07/19/2017 1130   ALKPHOS 75 05/18/2017 0529   AST 56 (H) 05/18/2017 0529   ALT 28 05/18/2017 0529   BILITOT 1.7 (H) 05/18/2017 0529       Assessment and Plan:   84 year old gentleman with the following issues:  1.  High-grade urothelial carcinoma of the bladder diagnosed in January 2019.  He presented with symptoms  of lower urinary tract symptoms and abdominal pain.  His workup revealed a mass in the bladder left-sided hydronephrosis on imaging studies.  He is status post TURBT on July 19, 2017 which showed high-grade urothelial carcinoma.  CT scan of the abdomen and pelvis completed in February 2019 showed adrenal metastasis as well as 2 hepatic lesions.  He also has sacral lesion.  The natural course of this disease was reviewed with the patient and his family.  He has an incurable malignancy with a rather aggressive course.  In fact that he has hepatic metastasis as well as a bone metastasis puts him at a disadvantage prognosis position.  Palliative chemotherapy may alter the natural course of this disease slightly but I doubt that will impact his overall survival dramatically.  Complication associated with palliative chemotherapy utilizing gemcitabine and carboplatin was discussed today.  These complications include nausea, fatigue, myelosuppression, neutropenia, neutropenic sepsis among others.  After discussion today, I have recommended against palliative chemotherapy at this time and recommended supportive care only.  I have also recommended hospice enrollment to the patient and his grandson accompanied him today.   2.  Painful sacral metastasis: He is currently receiving palliative radiation therapy by Dr. Tammi Klippel.  3.  Hydronephrosis: His creatinine was 1.4.  Continues to follow with urology regarding this issue.  4.  Prognosis: Poor with limited life expectancy.  I recommended hospice enrollment upon completing radiation therapy.  60  minutes was spent with the patient face-to-face today.  More than 50% of time was dedicated to patient counseling, education and coordination of the patient's care.

## 2017-08-26 NOTE — Telephone Encounter (Signed)
Printed avs and calender of upcoming appointment. Per 2/28 los 

## 2017-08-27 ENCOUNTER — Ambulatory Visit
Admission: RE | Admit: 2017-08-27 | Discharge: 2017-08-27 | Disposition: A | Discharge status: Home / Self Care | Payer: Medicare Other | Source: Ambulatory Visit | Attending: Radiation Oncology | Admitting: Radiation Oncology

## 2017-08-27 DIAGNOSIS — Z510 Encounter for antineoplastic radiation therapy: Secondary | ICD-10-CM | POA: Insufficient documentation

## 2017-08-27 DIAGNOSIS — C672 Malignant neoplasm of lateral wall of bladder: Secondary | ICD-10-CM | POA: Insufficient documentation

## 2017-08-29 ENCOUNTER — Inpatient Hospital Stay (HOSPITAL_COMMUNITY)
Admission: EM | Admit: 2017-08-29 | Discharge: 2017-09-03 | DRG: 871 | Disposition: A | Discharge status: Skilled Nursing Facility | Payer: Medicare Other | Attending: Internal Medicine | Admitting: Internal Medicine

## 2017-08-29 ENCOUNTER — Emergency Department (HOSPITAL_COMMUNITY): Payer: Medicare Other

## 2017-08-29 ENCOUNTER — Other Ambulatory Visit: Payer: Self-pay

## 2017-08-29 ENCOUNTER — Encounter (HOSPITAL_COMMUNITY): Payer: Self-pay

## 2017-08-29 DIAGNOSIS — N390 Urinary tract infection, site not specified: Secondary | ICD-10-CM | POA: Diagnosis not present

## 2017-08-29 DIAGNOSIS — F0280 Dementia in other diseases classified elsewhere without behavioral disturbance: Secondary | ICD-10-CM | POA: Diagnosis not present

## 2017-08-29 DIAGNOSIS — E785 Hyperlipidemia, unspecified: Secondary | ICD-10-CM | POA: Diagnosis present

## 2017-08-29 DIAGNOSIS — K6289 Other specified diseases of anus and rectum: Secondary | ICD-10-CM | POA: Diagnosis present

## 2017-08-29 DIAGNOSIS — N1330 Unspecified hydronephrosis: Secondary | ICD-10-CM | POA: Diagnosis not present

## 2017-08-29 DIAGNOSIS — Z961 Presence of intraocular lens: Secondary | ICD-10-CM | POA: Diagnosis present

## 2017-08-29 DIAGNOSIS — N179 Acute kidney failure, unspecified: Secondary | ICD-10-CM | POA: Diagnosis present

## 2017-08-29 DIAGNOSIS — Z955 Presence of coronary angioplasty implant and graft: Secondary | ICD-10-CM

## 2017-08-29 DIAGNOSIS — G9341 Metabolic encephalopathy: Secondary | ICD-10-CM | POA: Diagnosis present

## 2017-08-29 DIAGNOSIS — R6520 Severe sepsis without septic shock: Secondary | ICD-10-CM | POA: Diagnosis not present

## 2017-08-29 DIAGNOSIS — I2510 Atherosclerotic heart disease of native coronary artery without angina pectoris: Secondary | ICD-10-CM | POA: Diagnosis present

## 2017-08-29 DIAGNOSIS — E861 Hypovolemia: Secondary | ICD-10-CM | POA: Diagnosis present

## 2017-08-29 DIAGNOSIS — C679 Malignant neoplasm of bladder, unspecified: Secondary | ICD-10-CM | POA: Diagnosis present

## 2017-08-29 DIAGNOSIS — Z79899 Other long term (current) drug therapy: Secondary | ICD-10-CM

## 2017-08-29 DIAGNOSIS — I252 Old myocardial infarction: Secondary | ICD-10-CM

## 2017-08-29 DIAGNOSIS — F0390 Unspecified dementia without behavioral disturbance: Secondary | ICD-10-CM | POA: Diagnosis present

## 2017-08-29 DIAGNOSIS — N131 Hydronephrosis with ureteral stricture, not elsewhere classified: Secondary | ICD-10-CM | POA: Diagnosis not present

## 2017-08-29 DIAGNOSIS — N136 Pyonephrosis: Secondary | ICD-10-CM | POA: Diagnosis present

## 2017-08-29 DIAGNOSIS — E872 Acidosis: Secondary | ICD-10-CM

## 2017-08-29 DIAGNOSIS — E876 Hypokalemia: Secondary | ICD-10-CM | POA: Diagnosis present

## 2017-08-29 DIAGNOSIS — A4101 Sepsis due to Methicillin susceptible Staphylococcus aureus: Secondary | ICD-10-CM | POA: Diagnosis not present

## 2017-08-29 DIAGNOSIS — Z85828 Personal history of other malignant neoplasm of skin: Secondary | ICD-10-CM | POA: Diagnosis not present

## 2017-08-29 DIAGNOSIS — H9190 Unspecified hearing loss, unspecified ear: Secondary | ICD-10-CM | POA: Diagnosis present

## 2017-08-29 DIAGNOSIS — G20 Parkinson's disease: Secondary | ICD-10-CM | POA: Diagnosis not present

## 2017-08-29 DIAGNOSIS — N1339 Other hydronephrosis: Secondary | ICD-10-CM

## 2017-08-29 DIAGNOSIS — Z9842 Cataract extraction status, left eye: Secondary | ICD-10-CM | POA: Diagnosis not present

## 2017-08-29 DIAGNOSIS — A419 Sepsis, unspecified organism: Secondary | ICD-10-CM

## 2017-08-29 DIAGNOSIS — R404 Transient alteration of awareness: Secondary | ICD-10-CM | POA: Diagnosis present

## 2017-08-29 DIAGNOSIS — E119 Type 2 diabetes mellitus without complications: Secondary | ICD-10-CM | POA: Diagnosis present

## 2017-08-29 DIAGNOSIS — R740 Nonspecific elevation of levels of transaminase and lactic acid dehydrogenase [LDH]: Secondary | ICD-10-CM | POA: Diagnosis present

## 2017-08-29 DIAGNOSIS — Z7984 Long term (current) use of oral hypoglycemic drugs: Secondary | ICD-10-CM

## 2017-08-29 DIAGNOSIS — F05 Delirium due to known physiological condition: Secondary | ICD-10-CM

## 2017-08-29 DIAGNOSIS — Y842 Radiological procedure and radiotherapy as the cause of abnormal reaction of the patient, or of later complication, without mention of misadventure at the time of the procedure: Secondary | ICD-10-CM | POA: Diagnosis present

## 2017-08-29 DIAGNOSIS — Z9049 Acquired absence of other specified parts of digestive tract: Secondary | ICD-10-CM

## 2017-08-29 DIAGNOSIS — E871 Hypo-osmolality and hyponatremia: Secondary | ICD-10-CM | POA: Diagnosis present

## 2017-08-29 DIAGNOSIS — C7951 Secondary malignant neoplasm of bone: Secondary | ICD-10-CM | POA: Diagnosis present

## 2017-08-29 DIAGNOSIS — I10 Essential (primary) hypertension: Secondary | ICD-10-CM | POA: Diagnosis present

## 2017-08-29 DIAGNOSIS — Z87891 Personal history of nicotine dependence: Secondary | ICD-10-CM

## 2017-08-29 DIAGNOSIS — N183 Chronic kidney disease, stage 3 (moderate): Secondary | ICD-10-CM | POA: Diagnosis not present

## 2017-08-29 DIAGNOSIS — Z79891 Long term (current) use of opiate analgesic: Secondary | ICD-10-CM

## 2017-08-29 DIAGNOSIS — F0281 Dementia in other diseases classified elsewhere with behavioral disturbance: Secondary | ICD-10-CM | POA: Diagnosis not present

## 2017-08-29 LAB — URINALYSIS, ROUTINE W REFLEX MICROSCOPIC
BACTERIA UA: NONE SEEN
Bilirubin Urine: NEGATIVE
Glucose, UA: NEGATIVE mg/dL
KETONES UR: NEGATIVE mg/dL
Nitrite: NEGATIVE
PROTEIN: 100 mg/dL — AB
Specific Gravity, Urine: 1.016 (ref 1.005–1.030)
pH: 6 (ref 5.0–8.0)

## 2017-08-29 LAB — COMPREHENSIVE METABOLIC PANEL
ALBUMIN: 2.8 g/dL — AB (ref 3.5–5.0)
ALK PHOS: 455 U/L — AB (ref 38–126)
ALT: 53 U/L (ref 17–63)
ANION GAP: 15 (ref 5–15)
AST: 72 U/L — ABNORMAL HIGH (ref 15–41)
BUN: 30 mg/dL — ABNORMAL HIGH (ref 6–20)
CALCIUM: 8.8 mg/dL — AB (ref 8.9–10.3)
CO2: 30 mmol/L (ref 22–32)
Chloride: 87 mmol/L — ABNORMAL LOW (ref 101–111)
Creatinine, Ser: 1.77 mg/dL — ABNORMAL HIGH (ref 0.61–1.24)
GFR calc Af Amer: 38 mL/min — ABNORMAL LOW (ref >60)
GFR calc non Af Amer: 33 mL/min — ABNORMAL LOW (ref >60)
GLUCOSE: 151 mg/dL — AB (ref 65–99)
Potassium: 4.8 mmol/L (ref 3.5–5.1)
SODIUM: 132 mmol/L — AB (ref 135–145)
Total Bilirubin: 1.3 mg/dL — ABNORMAL HIGH (ref 0.3–1.2)
Total Protein: 8.1 g/dL (ref 6.5–8.1)

## 2017-08-29 LAB — CBC WITH DIFFERENTIAL/PLATELET
BASOS PCT: 0 %
Basophils Absolute: 0 10*3/uL (ref 0.0–0.1)
Eosinophils Absolute: 0 10*3/uL (ref 0.0–0.7)
Eosinophils Relative: 0 %
HCT: 37.6 % — ABNORMAL LOW (ref 39.0–52.0)
Hemoglobin: 12.3 g/dL — ABNORMAL LOW (ref 13.0–17.0)
LYMPHS PCT: 5 %
Lymphs Abs: 0.7 10*3/uL (ref 0.7–4.0)
MCH: 28.8 pg (ref 26.0–34.0)
MCHC: 32.7 g/dL (ref 30.0–36.0)
MCV: 88.1 fL (ref 78.0–100.0)
MONO ABS: 0.6 10*3/uL (ref 0.1–1.0)
MONOS PCT: 5 %
NEUTROS ABS: 13 10*3/uL — AB (ref 1.7–7.7)
Neutrophils Relative %: 90 %
Platelets: 369 10*3/uL (ref 150–400)
RBC: 4.27 MIL/uL (ref 4.22–5.81)
RDW: 13.8 % (ref 11.5–15.5)
WBC: 14.4 10*3/uL — ABNORMAL HIGH (ref 4.0–10.5)

## 2017-08-29 LAB — I-STAT CG4 LACTIC ACID, ED
Lactic Acid, Venous: 3.8 mmol/L (ref 0.5–1.9)
Lactic Acid, Venous: 1.67 mmol/L (ref 0.5–1.9)

## 2017-08-29 LAB — I-STAT TROPONIN, ED: Troponin i, poc: 0.02 ng/mL (ref 0.00–0.08)

## 2017-08-29 MED ORDER — SODIUM CHLORIDE 0.9 % IV SOLN
INTRAVENOUS | Status: AC
  Administered 2017-08-30: via INTRAVENOUS

## 2017-08-29 MED ORDER — HEPARIN SODIUM (PORCINE) 5000 UNIT/ML IJ SOLN
5000.0000 [IU] | Freq: Three times a day (TID) | INTRAMUSCULAR | Status: DC
  Administered 2017-08-30 – 2017-09-03 (×14): 5000 [IU] via SUBCUTANEOUS
  Filled 2017-08-29 (×14): qty 1

## 2017-08-29 MED ORDER — VANCOMYCIN HCL IN DEXTROSE 1-5 GM/200ML-% IV SOLN
1000.0000 mg | Freq: Once | INTRAVENOUS | Status: AC
  Administered 2017-08-29: 1000 mg via INTRAVENOUS
  Filled 2017-08-29: qty 200

## 2017-08-29 MED ORDER — PIPERACILLIN-TAZOBACTAM 3.375 G IVPB 30 MIN
3.3750 g | Freq: Once | INTRAVENOUS | Status: AC
  Administered 2017-08-29: 3.375 g via INTRAVENOUS
  Filled 2017-08-29: qty 50

## 2017-08-29 MED ORDER — ONDANSETRON HCL 4 MG/2ML IJ SOLN: 4.0000 mg | Freq: Four times a day (QID) | INTRAMUSCULAR | Status: DC | PRN

## 2017-08-29 MED ORDER — POLYETHYLENE GLYCOL 3350 17 G PO PACK
17.0000 g | PACK | Freq: Every day | ORAL | Status: DC
  Administered 2017-09-01 – 2017-09-02 (×2): 17 g via ORAL
  Filled 2017-08-29 (×2): qty 1

## 2017-08-29 MED ORDER — ACETAMINOPHEN 325 MG PO TABS
650.0000 mg | ORAL_TABLET | Freq: Four times a day (QID) | ORAL | Status: DC | PRN
  Administered 2017-08-30 – 2017-09-03 (×5): 650 mg via ORAL
  Filled 2017-08-29 (×5): qty 2

## 2017-08-29 MED ORDER — VANCOMYCIN HCL IN DEXTROSE 1-5 GM/200ML-% IV SOLN: 1000.0000 mg | INTRAVENOUS | Status: DC

## 2017-08-29 MED ORDER — INSULIN ASPART 100 UNIT/ML ~~LOC~~ SOLN
0.0000 [IU] | SUBCUTANEOUS | Status: DC
  Administered 2017-08-30: 2 [IU] via SUBCUTANEOUS
  Administered 2017-08-30 (×2): 1 [IU] via SUBCUTANEOUS
  Administered 2017-08-31: 0 [IU] via SUBCUTANEOUS
  Administered 2017-08-31: 5 [IU] via SUBCUTANEOUS
  Administered 2017-08-31: 0 [IU] via SUBCUTANEOUS
  Administered 2017-08-31: 2 [IU] via SUBCUTANEOUS
  Administered 2017-09-01: 0 [IU] via SUBCUTANEOUS
  Administered 2017-09-01: 1 [IU] via SUBCUTANEOUS
  Administered 2017-09-01 (×2): 0 [IU] via SUBCUTANEOUS
  Administered 2017-09-01: 2 [IU] via SUBCUTANEOUS
  Administered 2017-09-01 – 2017-09-02 (×2): 1 [IU] via SUBCUTANEOUS
  Administered 2017-09-02: 2 [IU] via SUBCUTANEOUS
  Administered 2017-09-02: 1 [IU] via SUBCUTANEOUS
  Administered 2017-09-02 – 2017-09-03 (×4): 2 [IU] via SUBCUTANEOUS

## 2017-08-29 MED ORDER — SODIUM CHLORIDE 0.9 % IV BOLUS (SEPSIS)
1000.0000 mL | Freq: Once | INTRAVENOUS | Status: AC
  Administered 2017-08-29: 1000 mL via INTRAVENOUS

## 2017-08-29 MED ORDER — ROSUVASTATIN CALCIUM 20 MG PO TABS
20.0000 mg | ORAL_TABLET | Freq: Every day | ORAL | Status: DC
  Administered 2017-08-30 – 2017-09-03 (×5): 20 mg via ORAL
  Filled 2017-08-29 (×5): qty 1

## 2017-08-29 MED ORDER — OXYCODONE HCL 5 MG PO TABS
2.5000 mg | ORAL_TABLET | Freq: Four times a day (QID) | ORAL | Status: DC | PRN
  Administered 2017-08-30 – 2017-09-01 (×3): 2.5 mg via ORAL
  Filled 2017-08-29 (×3): qty 1

## 2017-08-29 MED ORDER — DOCUSATE SODIUM 100 MG PO CAPS
100.0000 mg | ORAL_CAPSULE | Freq: Two times a day (BID) | ORAL | Status: DC
  Administered 2017-09-01 – 2017-09-03 (×5): 100 mg via ORAL
  Filled 2017-08-29 (×5): qty 1

## 2017-08-29 MED ORDER — SENNA 8.6 MG PO TABS
1.0000 | ORAL_TABLET | Freq: Every day | ORAL | Status: DC
  Administered 2017-08-31 – 2017-09-02 (×3): 8.6 mg via ORAL
  Filled 2017-08-29 (×2): qty 1

## 2017-08-29 MED ORDER — PIPERACILLIN-TAZOBACTAM 3.375 G IVPB
3.3750 g | Freq: Three times a day (TID) | INTRAVENOUS | Status: DC
  Administered 2017-08-30 – 2017-09-01 (×8): 3.375 g via INTRAVENOUS
  Filled 2017-08-29 (×9): qty 50

## 2017-08-29 MED ORDER — METOPROLOL TARTRATE 50 MG PO TABS
50.0000 mg | ORAL_TABLET | Freq: Two times a day (BID) | ORAL | Status: DC
  Administered 2017-08-30 – 2017-09-03 (×10): 50 mg via ORAL
  Filled 2017-08-29 (×10): qty 1

## 2017-08-29 MED ORDER — ONDANSETRON HCL 4 MG PO TABS: 4.0000 mg | ORAL_TABLET | Freq: Four times a day (QID) | ORAL | Status: DC | PRN

## 2017-08-29 MED ORDER — ACETAMINOPHEN 650 MG RE SUPP: 650.0000 mg | Freq: Four times a day (QID) | RECTAL | Status: DC | PRN

## 2017-08-29 MED ORDER — SODIUM CHLORIDE 0.9 % IV BOLUS (SEPSIS)
250.0000 mL | Freq: Once | INTRAVENOUS | Status: AC
  Administered 2017-08-29: 250 mL via INTRAVENOUS

## 2017-08-29 MED ORDER — ACETAMINOPHEN 650 MG RE SUPP
650.0000 mg | Freq: Once | RECTAL | Status: AC
  Administered 2017-08-29: 650 mg via RECTAL
  Filled 2017-08-29: qty 1

## 2017-08-29 NOTE — ED Triage Notes (Signed)
EMS reports from [facility], family called for AMS, current chemo pt for bladder cancer hx of Dementia  BP 180/94 HR 126  Resp 16 Sp02 96 RA174/85 CBG 220

## 2017-08-29 NOTE — ED Notes (Signed)
ED TO INPATIENT HANDOFF REPORT  Name/Age/Gender [name] Cook 84 y.o. male  Code Status Code Status History    Date Active Date Inactive Code Status Order ID Comments User Context   07/19/2017 16:32 07/20/2017 20:05 Full Code [id number]  Lucas Mallow, MD Inpatient   05/17/2017 17:14 05/20/2017 15:34 DNR [id number]  Tawni Millers, MD Inpatient   12/22/2016 10:46 03/04/2017 15:22 DNR [id number]  Lonn Georgia, PA-C Outpatient   12/13/2016 02:34 12/13/2016 15:46 Full Code [id number]  Ivor Costa, MD ED      Home/SNF/Other Skilled nursing facility  Chief Complaint AMS; Ca pt  Level of Care/Admitting Diagnosis ED Disposition    ED Disposition Condition : [facility] [[id number]]  Level of Care: Med-Surg [16]  Diagnosis: Sepsis [facility]) [[id number]]  Admitting Physician: Bennie Pierini [[id number]]  Attending Physician: Jonnie Finner, [place] [[id number]]  Estimated length of stay: 3 - 4 days  Certification:: I certify this patient will need inpatient services for at least 2 midnights  PT Class (Do Not Modify): Inpatient [101]  PT Acc Code (Do Not Modify): Private [1]       Medical History Past Medical History:  Diagnosis Date  . Bladder cancer ([place])   . Coronary artery disease   . Dementia    mild  . Diabetes mellitus without complication ([place])   . HOH (hard of hearing)    does not wear hearing aides, but can hear you if you speak loudly  . Hypertension   . Skin cancer 2002   on face  . STEMI involving right coronary artery ([place]) 2008   S/P 2.5 x 20 mm Taxus DES RCA, med rx for 30% LAD, 80% OM1 & dCFX, EF 60%    Allergies No Known Allergies  IV Location/Drains/Wounds Patient Lines/Drains/Airways Status   Active Line/Drains/Airways    Name:   Placement date:   Placement time:   Site:   Days:   Peripheral IV 08/29/17 Right;Upper Arm   08/29/17    1734    Arm   less than 1   Peripheral IV 08/29/17 Right Hand   08/29/17     2041    Hand   less than 1          Labs/Imaging Results for orders placed or performed during the hospital encounter of 08/29/17 (from the past 48 hour(s))  Comprehensive metabolic panel     Status: Abnormal   Collection Time: 08/29/17  4:53 PM  Result Value Ref Range   Sodium 132 (L) 135 - 145 mmol/L   Potassium 4.8 3.5 - 5.1 mmol/L   Chloride 87 (L) 101 - 111 mmol/L   CO2 30 22 - 32 mmol/L   Glucose, Bld 151 (H) 65 - 99 mg/dL   BUN 30 (H) 6 - 20 mg/dL   Creatinine, Ser 1.77 (H) 0.61 - 1.24 mg/dL   Calcium 8.8 (L) 8.9 - 10.3 mg/dL   Total Protein 8.1 6.5 - 8.1 g/dL   Albumin 2.8 (L) 3.5 - 5.0 g/dL   AST 72 (H) 15 - 41 U/L   ALT 53 17 - 63 U/L   Alkaline Phosphatase 455 (H) 38 - 126 U/L   Total Bilirubin 1.3 (H) 0.3 - 1.2 mg/dL   GFR calc non Af Amer 33 (L) >60 mL/min   GFR calc Af Amer 38 (L) >60 mL/min    Comment: (NOTE) The eGFR has been calculated using the CKD EPI equation. This calculation  has not been validated in all clinical situations. eGFR's persistently <60 mL/min signify possible Chronic Kidney Disease.    Anion gap 15 5 - 15    Comment: Performed at [facility], [place] [address] [place], [place] [address]  CBC WITH DIFFERENTIAL     Status: Abnormal   Collection Time: 08/29/17  4:53 PM  Result Value Ref Range   WBC 14.4 (H) 4.0 - 10.5 K/uL   RBC 4.27 4.22 - 5.81 MIL/uL   Hemoglobin 12.3 (L) 13.0 - 17.0 g/dL   HCT 37.6 (L) 39.0 - 52.0 %   MCV 88.1 78.0 - 100.0 fL   MCH 28.8 26.0 - 34.0 pg   MCHC 32.7 30.0 - 36.0 g/dL   RDW 13.8 11.5 - 15.5 %   Platelets 369 150 - 400 K/uL   Neutrophils Relative % 90 %   Neutro Abs 13.0 (H) 1.7 - 7.7 K/uL   Lymphocytes Relative 5 %   Lymphs Abs 0.7 0.7 - 4.0 K/uL   Monocytes Relative 5 %   Monocytes Absolute 0.6 0.1 - 1.0 K/uL   Eosinophils Relative 0 %   Eosinophils Absolute 0.0 0.0 - 0.7 K/uL   Basophils Relative 0 %   Basophils Absolute 0.0 0.0 - 0.1 K/uL    Comment: Performed at [facility], [place] [address], [place], [place] [address]  Urinalysis, Routine w reflex microscopic     Status: Abnormal   Collection Time: 08/29/17  4:53 PM  Result Value Ref Range   Color, Urine YELLOW YELLOW   APPearance TURBID (A) CLEAR   Specific Gravity, Urine 1.016 1.005 - 1.030   pH 6.0 5.0 - 8.0   Glucose, UA NEGATIVE NEGATIVE mg/dL   Hgb urine dipstick MODERATE (A) NEGATIVE   Bilirubin Urine NEGATIVE NEGATIVE   Ketones, ur NEGATIVE NEGATIVE mg/dL   Protein, ur 100 (A) NEGATIVE mg/dL   Nitrite NEGATIVE NEGATIVE   Leukocytes, UA LARGE (A) NEGATIVE   RBC / HPF TOO NUMEROUS TO COUNT 0 - 5 RBC/hpf   WBC, UA TOO NUMEROUS TO COUNT 0 - 5 WBC/hpf   Bacteria, UA NONE SEEN NONE SEEN   Squamous Epithelial / LPF 0-5 (A) NONE SEEN   WBC Clumps PRESENT    Mucus PRESENT    Non Squamous Epithelial 6-30 (A) NONE SEEN    Comment: Performed at [facility], [place] [address], [place], [place] [address]  I-stat troponin, ED (not at [facility], [facility])     Status: None   Collection Time: 08/29/17  5:32 PM  Result Value Ref Range   Troponin i, poc 0.02 0.00 - 0.08 ng/mL   Comment 3            Comment: Due to the release kinetics of cTnI, a negative result within the first hours of the onset of symptoms does not rule out myocardial infarction with certainty. If myocardial infarction is still suspected, repeat the test at appropriate intervals.   I-Stat CG4 Lactic Acid, ED  (not at  [facility])     Status: Abnormal   Collection Time: 08/29/17  5:33 PM  Result Value Ref Range   Lactic Acid, Venous 3.80 (HH) 0.5 - 1.9 mmol/L   Comment NOTIFIED PHYSICIAN   I-Stat CG4 Lactic Acid, ED  (not at  [facility])     Status: None   Collection Time: 08/29/17  8:45 PM  Result Value Ref Range   Lactic Acid, Venous 1.67 0.5 - 1.9 mmol/L   Ct  Abdomen Pelvis Wo Contrast  Result Date: 08/29/2017 CLINICAL DATA:  Nausea, vomiting. Current history of bladder cancer. EXAM: CT ABDOMEN AND PELVIS WITHOUT  CONTRAST TECHNIQUE: Multidetector CT imaging of the abdomen and pelvis was performed following the standard protocol without IV contrast. COMPARISON:  CT scan of August 09, 2017. FINDINGS: Lower chest: Increased bibasilar ill-defined densities are noted concerning for atelectasis or possibly inflammation. Hepatobiliary: Right hepatic low density is again noted concerning for metastatic disease. Status post cholecystectomy. No biliary dilatation. Pancreas: Unremarkable. No pancreatic ductal dilatation or surrounding inflammatory changes. Spleen: Normal in size without focal abnormality. Adrenals/Urinary Tract: 3.7 cm left adrenal mass is noted concerning for possible metastatic lesion. Right adrenal gland appears normal. Stable small hyperdense cyst is seen in right kidney. Stable moderate left hydroureteronephrosis is noted without obstructing calculus. This most likely is due to obstruction due to tumor in the bladder. Large mass is seen involving the anterior and left side of the urinary bladder, consistent with history of bladder carcinoma. Stomach/Bowel: The stomach appears normal. There is no evidence of bowel obstruction or inflammation. Status post appendectomy. Sigmoid diverticulosis is noted without inflammation. Vascular/Lymphatic: Aortic atherosclerosis. No enlarged abdominal or pelvic lymph nodes. Reproductive: Crescent-shaped fluid collection is seen along the left side of the prostate gland which most likely represents postsurgical change consistent with history of trans urethral resection. Other: No abdominal wall hernia or abnormality. No abdominopelvic ascites. Musculoskeletal: No acute or significant osseous findings. IMPRESSION: Increased bibasilar ill-defined densities are noted concerning for atelectasis or possibly inflammation. Right hepatic low density is noted concerning for metastatic disease. 3.7 cm left adrenal mass is noted concerning for metastatic disease. Stable moderate left  hydroureteronephrosis is noted without obstructing calculus. This most likely is due to obstruction due to large bladder tumor, which is unchanged compared to prior exam and consistent with history of bladder carcinoma. Sigmoid diverticulosis is noted without inflammation. Aortic Atherosclerosis (ICD10-I70.0). Electronically Signed   By: Marijo Conception, M.D.   On: 08/29/2017 19:14   Ct Head Wo Contrast  Result Date: 08/29/2017 CLINICAL DATA:  84 year old male with history of altered mental status. EXAM: CT HEAD WITHOUT CONTRAST TECHNIQUE: Contiguous axial images were obtained from the base of the skull through the vertex without intravenous contrast. COMPARISON:  Head CT 08/05/2012. FINDINGS: Brain: Moderate cerebral and mild cerebellar atrophy. Patchy and confluent areas of decreased attenuation are noted throughout the deep and periventricular white matter of the cerebral hemispheres bilaterally, compatible with chronic microvascular ischemic disease.No evidence of acute infarction, hemorrhage, hydrocephalus, extra-axial collection or mass lesion/mass effect. Vascular: No hyperdense vessel or unexpected calcification. Skull: Normal. Negative for fracture or focal lesion. Sinuses/Orbits: No acute finding. Other: None. IMPRESSION: 1. No acute intracranial abnormalities. 2. Moderate cerebral and mild cerebellar atrophy with chronic microvascular ischemic changes in the cerebral white matter, similar to the prior study, as above. Electronically Signed   By: Vinnie Langton M.D.   On: 08/29/2017 18:58   Dg Chest Port 1 View  Result Date: 08/29/2017 CLINICAL DATA:  Altered mental status. EXAM: PORTABLE CHEST 1 VIEW COMPARISON:  Radiographs of December 12, 2016. FINDINGS: The heart size and mediastinal contours are within normal limits. Atherosclerosis of thoracic aorta is noted. No pneumothorax or pleural effusion is noted. Left lung is clear. Mild right basilar subsegmental atelectasis is noted. The visualized  skeletal structures are unremarkable. IMPRESSION: Mild right basilar subsegmental atelectasis. Aortic Atherosclerosis (ICD10-I70.0). Electronically Signed   By: Marijo Conception, M.D.   On: 08/29/2017 17:42  Pending Labs Unresulted Labs (From admission, onward)   Start     Ordered   08/30/17 0500  Creatinine, serum  Daily,   R     08/29/17 2030   08/29/17 2008  Na and K (sodium & potassium), rand urine  Add-on,   R     08/29/17 2008   08/29/17 2008  Calcium / creatinine ratio, urine  Add-on,   R     08/29/17 2008   08/29/17 1653  Blood Culture (routine x 2)  BLOOD CULTURE X 2,   STAT     08/29/17 1652   08/29/17 1653  Urine culture  STAT,   STAT     08/29/17 1652   Signed and Held  Magnesium  Add-on,   R     Signed and Held   Signed and Held  Phosphorus  Add-on,   R     Signed and Held   Signed and Held  TSH  Add-on,   R     Signed and Held   Signed and Held  Vitamin B12  Add-on,   R     Signed and Held   Signed and Held  Ammonia  Add-on,   R     Signed and Held   Signed and Held  Comprehensive metabolic panel  Tomorrow morning,   R     Signed and Held   Signed and Held  CBC  Tomorrow morning,   R     Signed and Held   Signed and Held  Cortisol-am, blood  Tomorrow morning,   R     Signed and Held   Signed and Held  Protein / creatinine ratio, urine  Add-on,   R     Signed and Held      Vitals/Pain Today's Vitals   08/29/17 2030 08/29/17 2100 08/29/17 2130 08/29/17 2230  BP: (!) 167/73 (!) 177/90 (!) 188/91 (!) 181/90  Pulse: (!) 52 (!) 114 (!) 44 88  Resp: (!) 21 17 (!) 22 18  Temp:    (S) 98 F (36.7 C)  TempSrc:    (S) Oral  SpO2:      Weight:      Height:      PainSc:        Isolation Precautions No active isolations  Medications Medications  piperacillin-tazobactam (ZOSYN) IVPB 3.375 g (not administered)  vancomycin (VANCOCIN) IVPB 1000 mg/200 mL premix (not administered)  sodium chloride 0.9 % bolus 1,000 mL (0 mLs Intravenous Stopped 08/29/17 1817)     And  sodium chloride 0.9 % bolus 1,000 mL (0 mLs Intravenous Stopped 08/29/17 1818)    And  sodium chloride 0.9 % bolus 250 mL (0 mLs Intravenous Stopped 08/29/17 1817)  piperacillin-tazobactam (ZOSYN) IVPB 3.375 g (0 g Intravenous Stopped 08/29/17 1818)  vancomycin (VANCOCIN) IVPB 1000 mg/200 mL premix (0 mg Intravenous Stopped 08/29/17 1854)  acetaminophen (TYLENOL) suppository 650 mg (650 mg Rectal Given 08/29/17 1733)    Mobility manual wheelchair

## 2017-08-29 NOTE — Progress Notes (Signed)
A consult was received from an ED physician for vancomycin and zosyn per pharmacy dosing.  The patient's profile has been reviewed for ht/wt/allergies/indication/available labs.   A one time order has been placed for vancomycin 1g and zosyn 3.375g.    Further antibiotics/pharmacy consults should be ordered by admitting physician if indicated.                       Thank you, Peggyann Juba, PharmD, BCPS 08/29/2017  5:07 PM

## 2017-08-29 NOTE — ED Notes (Signed)
Pt unable to tolerate bladder scan--- pt grimaces in pain with little pressure to lower abdomen.  Pt has been voiding incontinently.

## 2017-08-29 NOTE — H&P (Signed)
History and Physical    [name] Cook GXQ:[id number] DOB: January 09, 1927 DOA: 08/29/2017  PCP: System, Pcp Not In Patient coming from: [place]  I have personally briefly reviewed patient's old medical records in [place]  Chief Complaint: Altered mental status  HPI: [name] Cook is a 84 y.o. male with medical history significant for recently diagnosed metastatic urothelial carcinoma s/p TURBT (06/2017) on palliative radiation therapy, T2DM, CAD s/p PCI with remote DES, HTN and dementia who presented from SNF with worsening confusion and decreased oral intake. He was moved from ALF to SNF within the last 2 weeks given his worsening mentation. He was unresponsive on the day of admission at his SNF. Mentation seems to be fluctuating per the daughter. She reports no specific urinary symptoms, however, he has had recent issues with UTI. No reports of fever, chills, nausea, vomiting, constipation, dysuria or hematuria. No indwelling Foley present at the time of arrival to ED and patient has been able to void on his own per the daughter. The patient is unable to provide any history.  ED Course: In the ED, pt febrile to 102.75F, mildly tachycardic to 93, hypertensive with SBP to 190s, saturating comfortably on room air. Labs notable for WBC 14.4, Hgb 12.7, platelets 363, Na 132, K 4.8, CO2 30, BUN 30, Cr 1.77, AST 72, ALT 53, ALP 455, albumin 2.8 (TProt 8.1). Lactate 3.8 initially, down-trended to 1.7 after IVF boluses. U/A showed large leukocytes, negative nitrates, positive mucus and WBCs, negative bacteria. CT a/p showed stable moderate left hydronephrosis secondary to large bladder tumor, sigmoid diverticulosis without evidence of inflammation, 3.7 cm adrenal mass and right hepatic lobe low-density lesion concerning for metastatic disease. CT head showed no acute abnormality on a background of moderate cerebral atrophy with chronic ischemic changes. Code sepsis is called me ED and patient  received lincomycin and Zosyn along with 30 mL per KG normal saline. Patient stable on admission to the floor.  Review of Systems: As per HPI otherwise 10 point review of systems negative.   Past Medical History:  Diagnosis Date  . Bladder cancer ([place])   . Coronary artery disease   . Dementia    mild  . Diabetes mellitus without complication ([place])   . HOH (hard of hearing)    does not wear hearing aides, but can hear you if you speak loudly  . Hypertension   . Skin cancer 2002   on face  . STEMI involving right coronary artery ([place]) 2008   S/P 2.5 x 20 mm Taxus DES RCA, med rx for 30% LAD, 80% OM1 & dCFX, EF 60%    Past Surgical History:  Procedure Laterality Date  . APPENDECTOMY    . CATARACT EXTRACTION W/PHACO Left 06/10/2016   Procedure: CATARACT EXTRACTION PHACO AND INTRAOCULAR LENS PLACEMENT (IOC);  Surgeon: Estill Cotta, MD;  Location: [facility] ORS;  Service: Ophthalmology;  Laterality: Left;  Lot # [id number] H [place]: 01:04.0 AP% 25.5 CDE: 29.46  . CHOLECYSTECTOMY    . CORONARY ANGIOPLASTY WITH STENT PLACEMENT  2008   2.5 x 20 mm Taxus DES to 95% RCA, 80% dCFX, 80% OM1, 30% LAD > med rx, EF 60%  . EYE SURGERY    . HERNIA REPAIR     bilateral inguinal hernia  . PROSTATE SURGERY    . TRANSURETHRAL RESECTION OF BLADDER TUMOR N/A 07/19/2017   Procedure: TRANSURETHRAL RESECTION OF BLADDER TUMOR (TURBT);  Surgeon: Lucas Mallow, MD;  Location: [facility] ORS;  Service: Urology;  Laterality: N/A;     reports that he quit smoking about 49 years ago. His smoking use included cigarettes. He has a 10.00 pack-year smoking history. he has never used smokeless tobacco. He reports that he does not drink alcohol or use drugs.  No Known Allergies  Family History  Problem Relation Age of Onset  . Cancer Neg Hx     Prior to Admission medications   Medication Sig Start Date End Date Taking? Authorizing Provider  acetaminophen (TYLENOL) 500 MG tablet Take 500 mg by mouth every 6 (six) hours  as needed for moderate pain.   Yes [provider]  artificial tears (LACRILUBE) OINT ophthalmic ointment Place into both eyes.   Yes [provider]  docusate sodium (COLACE) 100 MG capsule Take 100 mg by mouth 2 (two) times daily.   Yes [provider]  glipiZIDE (GLUCOTROL XL) 10 MG 24 hr tablet Take 10 mg daily with breakfast by mouth.   Yes [provider]  hydrocortisone (ANUSOL-HC) 25 MG suppository Place 1 suppository (25 mg total) rectally 2 (two) times daily as needed for hemorrhoids (rectal pressure/pain). 08/19/17  Yes Bruning, Ashlyn, PA-C  Magnesium Hydroxide (MILK OF MAGNESIA CONCENTRATE PO) Take 30 mLs by mouth daily as needed (for constipation).   Yes [provider]  metoprolol tartrate (LOPRESSOR) 50 MG tablet Take 1 tablet (50 mg total) by mouth 2 (two) times daily. 12/22/16  Yes Barrett, Evelene Croon, PA-C  Multiple Vitamins-Minerals (PRESERVISION AREDS PO) Take by mouth.   Yes [provider]  nitroGLYCERIN (NITROSTAT) 0.4 MG SL tablet Place 0.4 mg under the tongue every 5 (five) minutes as needed for chest pain.   Yes [provider]  oxyCODONE (OXY IR/ROXICODONE) 5 MG immediate release tablet Take 0.5 tablets (2.5 mg total) by mouth every 6 (six) hours as needed for moderate pain. 05/20/17  Yes Domenic Polite, MD  polyethylene glycol [facility] / GLYCOLAX) packet Take 17 g by mouth daily.   Yes [provider]  rosuvastatin (CRESTOR) 20 MG tablet Take 20 mg by mouth daily.    Yes [provider]  senna (SENOKOT) 8.6 MG TABS tablet Take 1 tablet (8.6 mg total) by mouth daily as needed for mild constipation. 05/20/17  Yes Domenic Polite, MD    Physical Exam: Vitals:   08/29/17 1652 08/29/17 1735 08/29/17 1818 08/29/17 1854  BP:   (!) 177/69 (!) 197/85  Pulse:  69 74 79  Resp:  16 16 16   Temp: (!) 102.4 F (39.1 C)     TempSrc: Rectal     SpO2:  94% 95% 96%  Weight:      Height:        Constitutional: NAD, calm, comfortable Eyes: PERRL, lids and conjunctivae normal ENMT: Mucous membranes are dry. Posterior pharynx clear of any exudate or lesions.  Neck: normal, supple, no masses Respiratory: clear to auscultation bilaterally, no wheezing, no crackles. Normal respiratory effort. Cardiovascular: Regular rate and rhythm, no murmurs / rubs / gallops. No extremity edema. 2+ pedal pulses. Abdomen: no tenderness, no masses palpated. Bowel sounds positive.  Musculoskeletal: no clubbing / cyanosis. No joint deformity upper and lower extremities. Good ROM, no contractures. Skin: no rashes, lesions, ulcers. No induration. Neurologic: arousable, fluctuating mentation, oriented to self and place ("hospital"), not oriented to time  Labs on Admission: I have personally reviewed following labs and imaging studies  CBC: Recent Labs  Lab 08/29/17 1653  WBC 14.4*  NEUTROABS 13.0*  HGB 12.3*  HCT 37.6*  MCV 88.1  PLT 631   Basic Metabolic Panel: Recent Labs  Lab 08/29/17 1653  NA 132*  K 4.8  CL 87*  CO2 30  GLUCOSE 151*  BUN 30*  CREATININE 1.77*  CALCIUM 8.8*   GFR: Estimated Creatinine Clearance: 29.6 mL/min (A) (by C-G formula based on SCr of 1.77 mg/dL (H)). Liver Function Tests: Recent Labs  Lab 08/29/17 1653  AST 72*  ALT 53  ALKPHOS 455*  BILITOT 1.3*  PROT 8.1  ALBUMIN 2.8*   No results for input(s): LIPASE, AMYLASE in the last 168 hours. No results for input(s): AMMONIA in the last 168 hours. Coagulation Profile: No results for input(s): INR, PROTIME in the last 168 hours. Cardiac Enzymes: No results for input(s): CKTOTAL, CKMB, CKMBINDEX, TROPONINI in the last 168 hours. BNP (last 3 results) No results for input(s): PROBNP in the last 8760 hours. HbA1C: No results for input(s): HGBA1C in the last 72 hours. CBG: No results for input(s): GLUCAP in the last 168 hours. Lipid Profile: No results for input(s): CHOL, HDL, LDLCALC, TRIG, CHOLHDL,  LDLDIRECT in the last 72 hours. Thyroid Function Tests: No results for input(s): TSH, T4TOTAL, FREET4, T3FREE, THYROIDAB in the last 72 hours. Anemia Panel: No results for input(s): VITAMINB12, FOLATE, FERRITIN, TIBC, IRON, RETICCTPCT in the last 72 hours. Urine analysis:    Component Value Date/Time   COLORURINE YELLOW 08/29/2017 1653   APPEARANCEUR TURBID (A) 08/29/2017 1653   LABSPEC 1.016 08/29/2017 1653   PHURINE 6.0 08/29/2017 1653   GLUCOSEU NEGATIVE 08/29/2017 1653   HGBUR MODERATE (A) 08/29/2017 1653   BILIRUBINUR NEGATIVE 08/29/2017 1653   KETONESUR NEGATIVE 08/29/2017 1653   PROTEINUR 100 (A) 08/29/2017 1653   NITRITE NEGATIVE 08/29/2017 1653   LEUKOCYTESUR LARGE (A) 08/29/2017 1653    Radiological Exams on Admission: Ct Abdomen Pelvis Wo Contrast  Result Date: 08/29/2017 CLINICAL DATA:  Nausea, vomiting. Current history of bladder cancer. EXAM: CT ABDOMEN AND PELVIS WITHOUT CONTRAST TECHNIQUE: Multidetector CT imaging of the abdomen and pelvis was performed following the standard protocol without IV contrast. COMPARISON:  CT scan of August 09, 2017. FINDINGS: Lower chest: Increased bibasilar ill-defined densities are noted concerning for atelectasis or possibly inflammation. Hepatobiliary: Right hepatic low density is again noted concerning for metastatic disease. Status post cholecystectomy. No biliary dilatation. Pancreas: Unremarkable. No pancreatic ductal dilatation or surrounding inflammatory changes. Spleen: Normal in size without focal abnormality. Adrenals/Urinary Tract: 3.7 cm left adrenal mass is noted concerning for possible metastatic lesion. Right adrenal gland appears normal. Stable small hyperdense cyst is seen in right kidney. Stable moderate left hydroureteronephrosis is noted without obstructing calculus. This most likely is due to obstruction due to tumor in the bladder. Large mass is seen involving the anterior and left side of the urinary bladder, consistent  with history of bladder carcinoma. Stomach/Bowel: The stomach appears normal. There is no evidence of bowel obstruction or inflammation. Status post appendectomy. Sigmoid diverticulosis is noted without inflammation. Vascular/Lymphatic: Aortic atherosclerosis. No enlarged abdominal or pelvic lymph nodes. Reproductive: Crescent-shaped fluid collection is seen along the left side of the prostate gland which most likely represents postsurgical change consistent with history of trans urethral resection. Other: No abdominal wall hernia or abnormality. No abdominopelvic ascites. Musculoskeletal: No acute or significant osseous findings. IMPRESSION: Increased bibasilar ill-defined densities are noted concerning for atelectasis or possibly inflammation. Right hepatic low density is noted concerning for metastatic disease. 3.7 cm left adrenal mass is noted concerning for metastatic disease. Stable moderate left hydroureteronephrosis is noted  without obstructing calculus. This most likely is due to obstruction due to large bladder tumor, which is unchanged compared to prior exam and consistent with history of bladder carcinoma. Sigmoid diverticulosis is noted without inflammation. Aortic Atherosclerosis (ICD10-I70.0). Electronically Signed   By: Marijo Conception, M.D.   On: 08/29/2017 19:14   Ct Head Wo Contrast  Result Date: 08/29/2017 CLINICAL DATA:  84 year old male with history of altered mental status. EXAM: CT HEAD WITHOUT CONTRAST TECHNIQUE: Contiguous axial images were obtained from the base of the skull through the vertex without intravenous contrast. COMPARISON:  Head CT 08/05/2012. FINDINGS: Brain: Moderate cerebral and mild cerebellar atrophy. Patchy and confluent areas of decreased attenuation are noted throughout the deep and periventricular white matter of the cerebral hemispheres bilaterally, compatible with chronic microvascular ischemic disease.No evidence of acute infarction, hemorrhage, hydrocephalus,  extra-axial collection or mass lesion/mass effect. Vascular: No hyperdense vessel or unexpected calcification. Skull: Normal. Negative for fracture or focal lesion. Sinuses/Orbits: No acute finding. Other: None. IMPRESSION: 1. No acute intracranial abnormalities. 2. Moderate cerebral and mild cerebellar atrophy with chronic microvascular ischemic changes in the cerebral white matter, similar to the prior study, as above. Electronically Signed   By: Vinnie Langton M.D.   On: 08/29/2017 18:58   Dg Chest Port 1 View  Result Date: 08/29/2017 CLINICAL DATA:  Altered mental status. EXAM: PORTABLE CHEST 1 VIEW COMPARISON:  Radiographs of December 12, 2016. FINDINGS: The heart size and mediastinal contours are within normal limits. Atherosclerosis of thoracic aorta is noted. No pneumothorax or pleural effusion is noted. Left lung is clear. Mild right basilar subsegmental atelectasis is noted. The visualized skeletal structures are unremarkable. IMPRESSION: Mild right basilar subsegmental atelectasis. Aortic Atherosclerosis (ICD10-I70.0). Electronically Signed   By: Marijo Conception, M.D.   On: 08/29/2017 17:42   Assessment/Plan Active Problems:   Sepsis ([place])  Severe sepsis likely secondary to pyelonephritis Lactic acidosis - Continue vanc, pip-tazo (Pharmacy managed) - Continue IV hydration - Follow up blood, urine cultures - Trend lactate to close - Daily CBC, trend fever curve - APAP PRN for fever >101F  Hypoactive delirium - Likely in setting of active infection - Delirium protocols, avoid sedating meds, anticholinergics - Add-on TSH, B12, ammonia - Neuro checks Q4H - NPO for now  Suspected prerenal AKI in setting of decreased PO intake Chronic left hydronephrosis - IVF as above - Obtain renal U/S to better characterize hydronephrosis - Obtain urine studies, including urine prot/crt - Consider adding SPEP/UPEP/free light chains given protein gap >5 - Bladder scans Q8H initially, insert Foley  if retaining - Strict I/O, daily weights - Avoid nephrotoxins - Daily BMP, Mg, PO4  Abnormal LFTs - AST/ALT elevation likely in setting of sepsis - Alk phos elevation likely due to known bony metastasis - Trend to close  Metastatic urothelial carcinoma s/p TURBT on palliative radiation therapy - Pt can likely continue radiation therapy while hospitalized - Would contact Dr. Tammi Klippel in AM - Evaluate ureteral obstruction with renal U/S as above - Pain control PRN for known bony mets  DVT prophylaxis: SQH Code Status: Full Family Communication: Daughter, Betha Loa [phone]) Disposition Plan: SNF in 3-5 days Consults called: None Admission status: Inpatient   Bennie Pierini MD [facility]  If 7PM-7AM, please contact night-coverage [email or web address] Password [email or web address]  08/29/2017, 10:30 PM

## 2017-08-29 NOTE — ED Provider Notes (Addendum)
[facility] DEPT Provider Note   CSN: [id number] Arrival date & time: 08/29/17  1626     History   Chief Complaint Chief Complaint  Patient presents with  . Altered Mental Status    HPI [name] Cook is a 84 y.o. male.  HPI 84 year old male with past medical history as below including bladder cancer, dementia, coronary disease, here with altered mental status.  History is severely limited due to altered mental status.  Patient is unable to answer occasional questions which he answers appropriately.  Patient is without any specific complaints.  According to EMS, patient is currently on chemo for bladder cancer and was brought here with altered mental status.  He is here from [place] facility.  He is full code.  Heart rate has been elevated in route.  Blood sugar was 220.  Blood pressure is been stable to elevated.  Level 5 caveat invoked as remainder of history, ROS, and physical exam limited due to patient's mental status changes/dementia.   Past Medical History:  Diagnosis Date  . Bladder cancer ([place])   . Coronary artery disease   . Dementia    mild  . Diabetes mellitus without complication ([place])   . HOH (hard of hearing)    does not wear hearing aides, but can hear you if you speak loudly  . Hypertension   . Skin cancer 2002   on face  . STEMI involving right coronary artery ([place]) 2008   S/P 2.5 x 20 mm Taxus DES RCA, med rx for 30% LAD, 80% OM1 & dCFX, EF 60%    Patient Active Problem List   Diagnosis Date Noted  . Sepsis ([place]) 08/29/2017  . Cancer of bladder ([place]) 08/19/2017  . Bladder disease 07/19/2017  . Compression fracture of L2 ([place]) 05/24/2017  . Rhabdomyolysis 05/24/2017  . Mass of urinary bladder determined by ultrasound 05/24/2017  . Dementia 05/24/2017  . Compression fracture of body of thoracic vertebra ([place]) 05/17/2017  . Diabetes mellitus without complication ([place])   . Coronary artery disease   .  Hypertension     Past Surgical History:  Procedure Laterality Date  . APPENDECTOMY    . CATARACT EXTRACTION W/PHACO Left 06/10/2016   Procedure: CATARACT EXTRACTION PHACO AND INTRAOCULAR LENS PLACEMENT (IOC);  Surgeon: Estill Cotta, MD;  Location: [facility] ORS;  Service: Ophthalmology;  Laterality: Left;  Lot # [id number] [place]: 01:04.0 AP% 25.5 CDE: 29.46  . CHOLECYSTECTOMY    . CORONARY ANGIOPLASTY WITH STENT PLACEMENT  2008   2.5 x 20 mm Taxus DES to 95% RCA, 80% dCFX, 80% OM1, 30% LAD > med rx, EF 60%  . EYE SURGERY    . HERNIA REPAIR     bilateral inguinal hernia  . PROSTATE SURGERY    . TRANSURETHRAL RESECTION OF BLADDER TUMOR N/A 07/19/2017   Procedure: TRANSURETHRAL RESECTION OF BLADDER TUMOR (TURBT);  Surgeon: Lucas Mallow, MD;  Location: [facility] ORS;  Service: Urology;  Laterality: N/A;       Home Medications    Prior to Admission medications   Medication Sig Start Date End Date Taking? Authorizing Provider  acetaminophen (TYLENOL) 500 MG tablet Take 500 mg by mouth every 6 (six) hours as needed for moderate pain.   Yes [provider]  artificial tears (LACRILUBE) OINT ophthalmic ointment Place into both eyes.   Yes [provider]  docusate sodium (COLACE) 100 MG capsule Take 100 mg by mouth 2 (two) times daily.   Yes [provider]  glipiZIDE (GLUCOTROL XL) 10 MG 24 hr tablet Take 10 mg daily with breakfast by mouth.   Yes [provider]  hydrocortisone (ANUSOL-HC) 25 MG suppository Place 1 suppository (25 mg total) rectally 2 (two) times daily as needed for hemorrhoids (rectal pressure/pain). 08/19/17  Yes Bruning, Ashlyn, PA-C  Magnesium Hydroxide (MILK OF MAGNESIA CONCENTRATE PO) Take 30 mLs by mouth daily as needed (for constipation).   Yes [provider]  metoprolol tartrate (LOPRESSOR) 50 MG tablet Take 1 tablet (50 mg total) by mouth 2 (two) times daily. 12/22/16  Yes Barrett, Evelene Croon, PA-C  Multiple Vitamins-Minerals  (PRESERVISION AREDS PO) Take by mouth.   Yes [provider]  nitroGLYCERIN (NITROSTAT) 0.4 MG SL tablet Place 0.4 mg under the tongue every 5 (five) minutes as needed for chest pain.   Yes [provider]  oxyCODONE (OXY IR/ROXICODONE) 5 MG immediate release tablet Take 0.5 tablets (2.5 mg total) by mouth every 6 (six) hours as needed for moderate pain. 05/20/17  Yes Domenic Polite, MD  polyethylene glycol [facility] / GLYCOLAX) packet Take 17 g by mouth daily.   Yes [provider]  rosuvastatin (CRESTOR) 20 MG tablet Take 20 mg by mouth daily.    Yes [provider]  senna (SENOKOT) 8.6 MG TABS tablet Take 1 tablet (8.6 mg total) by mouth daily as needed for mild constipation. 05/20/17  Yes Domenic Polite, MD    Family History Family History  Problem Relation Age of Onset  . Cancer Neg Hx     Social History Social History   Tobacco Use  . Smoking status: Former Smoker    Packs/day: 0.50    Years: 20.00    Pack years: 10.00    Types: Cigarettes    Last attempt to quit: 06/29/1968    Years since quitting: 49.2  . Smokeless tobacco: Never Used  Substance Use Topics  . Alcohol use: No  . Drug use: No     Allergies   Patient has no known allergies.   Review of Systems Review of Systems  Unable to perform ROS: Dementia     Physical Exam Updated Vital Signs BP (!) 197/85   Pulse 79   Temp (!) 102.4 F (39.1 C) (Rectal)   Resp 16   Ht 6\' 1"  (1.854 m)   Wt 72.6 kg (160 lb)   SpO2 96%   BMI 21.11 kg/m   Physical Exam  Constitutional: He appears well-developed and well-nourished. He appears distressed.  Elderly, frail  HENT:  Head: Normocephalic and atraumatic.  Markedly dry mucous membranes  Eyes: Conjunctivae are normal.  Neck: Neck supple.  Cardiovascular: Normal rate. Exam reveals no friction rub.  No murmur heard. Tachycardia, irregular rhythm  Pulmonary/Chest: Effort normal and breath sounds normal. No respiratory  distress. He has no wheezes. He has no rales.  Bibasilar rales  Abdominal: Soft. He exhibits no distension. There is no tenderness.  Genitourinary:  Genitourinary Comments: No penile lesions or rash.  Musculoskeletal: He exhibits no edema.  Neurological: He exhibits normal muscle tone.  Drowsy but answers questions with one-word.  Moves all extremities.  Face symmetric.  Speech slightly slurred and soft.  Skin: Skin is warm. Capillary refill takes less than 2 seconds.  Psychiatric: He has a normal mood and affect.  Nursing note and vitals reviewed.    ED Treatments / Results  Labs (all labs ordered are listed, but only abnormal results are displayed) Labs Reviewed  COMPREHENSIVE METABOLIC PANEL -  Abnormal; Notable for the following components:      Result Value   Sodium 132 (*)    Chloride 87 (*)    Glucose, Bld 151 (*)    BUN 30 (*)    Creatinine, Ser 1.77 (*)    Calcium 8.8 (*)    Albumin 2.8 (*)    AST 72 (*)    Alkaline Phosphatase 455 (*)    Total Bilirubin 1.3 (*)    GFR calc non Af Amer 33 (*)    GFR calc Af Amer 38 (*)    All other components within normal limits  CBC WITH DIFFERENTIAL/PLATELET - Abnormal; Notable for the following components:   WBC 14.4 (*)    Hemoglobin 12.3 (*)    HCT 37.6 (*)    Neutro Abs 13.0 (*)    All other components within normal limits  URINALYSIS, ROUTINE W REFLEX MICROSCOPIC - Abnormal; Notable for the following components:   APPearance TURBID (*)    Hgb urine dipstick MODERATE (*)    Protein, ur 100 (*)    Leukocytes, UA LARGE (*)    Squamous Epithelial / LPF 0-5 (*)    Non Squamous Epithelial 6-30 (*)    All other components within normal limits  I-STAT CG4 LACTIC ACID, ED - Abnormal; Notable for the following components:   Lactic Acid, Venous 3.80 (*)    All other components within normal limits  CULTURE, BLOOD (ROUTINE X 2)  CULTURE, BLOOD (ROUTINE X 2)  URINE CULTURE  NA AND K (SODIUM & POTASSIUM), RAND UR  CALCIUM /  CREATININE RATIO, URINE  CREATININE, SERUM  I-STAT TROPONIN, ED  I-STAT CG4 LACTIC ACID, ED    EKG  EKG Interpretation  Date/Time:  Sunday August 29 2017 16:47:53 EST Ventricular Rate:  96 PR Interval:    QRS Duration: 102 QT Interval:  376 QTC Calculation: 476 R Axis:   83 Text Interpretation:  Atrial fibrillation versus sinus with PACs Paired ventricular premature complexes Left ventricular hypertrophy Probable inferior infarct, old Confirmed by Duffy Bruce [phone]) on 08/29/2017 4:58:02 PM       Radiology Ct Abdomen Pelvis Wo Contrast  Result Date: 08/29/2017 CLINICAL DATA:  Nausea, vomiting. Current history of bladder cancer. EXAM: CT ABDOMEN AND PELVIS WITHOUT CONTRAST TECHNIQUE: Multidetector CT imaging of the abdomen and pelvis was performed following the standard protocol without IV contrast. COMPARISON:  CT scan of August 09, 2017. FINDINGS: Lower chest: Increased bibasilar ill-defined densities are noted concerning for atelectasis or possibly inflammation. Hepatobiliary: Right hepatic low density is again noted concerning for metastatic disease. Status post cholecystectomy. No biliary dilatation. Pancreas: Unremarkable. No pancreatic ductal dilatation or surrounding inflammatory changes. Spleen: Normal in size without focal abnormality. Adrenals/Urinary Tract: 3.7 cm left adrenal mass is noted concerning for possible metastatic lesion. Right adrenal gland appears normal. Stable small hyperdense cyst is seen in right kidney. Stable moderate left hydroureteronephrosis is noted without obstructing calculus. This most likely is due to obstruction due to tumor in the bladder. Large mass is seen involving the anterior and left side of the urinary bladder, consistent with history of bladder carcinoma. Stomach/Bowel: The stomach appears normal. There is no evidence of bowel obstruction or inflammation. Status post appendectomy. Sigmoid diverticulosis is noted without inflammation.  Vascular/Lymphatic: Aortic atherosclerosis. No enlarged abdominal or pelvic lymph nodes. Reproductive: Crescent-shaped fluid collection is seen along the left side of the prostate gland which most likely represents postsurgical change consistent with history of trans urethral resection. Other: No abdominal wall  hernia or abnormality. No abdominopelvic ascites. Musculoskeletal: No acute or significant osseous findings. IMPRESSION: Increased bibasilar ill-defined densities are noted concerning for atelectasis or possibly inflammation. Right hepatic low density is noted concerning for metastatic disease. 3.7 cm left adrenal mass is noted concerning for metastatic disease. Stable moderate left hydroureteronephrosis is noted without obstructing calculus. This most likely is due to obstruction due to large bladder tumor, which is unchanged compared to prior exam and consistent with history of bladder carcinoma. Sigmoid diverticulosis is noted without inflammation. Aortic Atherosclerosis (ICD10-I70.0). Electronically Signed   By: Marijo Conception, M.D.   On: 08/29/2017 19:14   Ct Head Wo Contrast  Result Date: 08/29/2017 CLINICAL DATA:  84 year old male with history of altered mental status. EXAM: CT HEAD WITHOUT CONTRAST TECHNIQUE: Contiguous axial images were obtained from the base of the skull through the vertex without intravenous contrast. COMPARISON:  Head CT 08/05/2012. FINDINGS: Brain: Moderate cerebral and mild cerebellar atrophy. Patchy and confluent areas of decreased attenuation are noted throughout the deep and periventricular white matter of the cerebral hemispheres bilaterally, compatible with chronic microvascular ischemic disease.No evidence of acute infarction, hemorrhage, hydrocephalus, extra-axial collection or mass lesion/mass effect. Vascular: No hyperdense vessel or unexpected calcification. Skull: Normal. Negative for fracture or focal lesion. Sinuses/Orbits: No acute finding. Other: None.  IMPRESSION: 1. No acute intracranial abnormalities. 2. Moderate cerebral and mild cerebellar atrophy with chronic microvascular ischemic changes in the cerebral white matter, similar to the prior study, as above. Electronically Signed   By: Vinnie Langton M.D.   On: 08/29/2017 18:58   Dg Chest Port 1 View  Result Date: 08/29/2017 CLINICAL DATA:  Altered mental status. EXAM: PORTABLE CHEST 1 VIEW COMPARISON:  Radiographs of December 12, 2016. FINDINGS: The heart size and mediastinal contours are within normal limits. Atherosclerosis of thoracic aorta is noted. No pneumothorax or pleural effusion is noted. Left lung is clear. Mild right basilar subsegmental atelectasis is noted. The visualized skeletal structures are unremarkable. IMPRESSION: Mild right basilar subsegmental atelectasis. Aortic Atherosclerosis (ICD10-I70.0). Electronically Signed   By: Marijo Conception, M.D.   On: 08/29/2017 17:42    Procedures .Critical Care Performed by: Duffy Bruce, MD Authorized by: Duffy Bruce, MD   Critical care provider statement:    Critical care time (minutes):  45   Critical care time was exclusive of:  Separately billable procedures and treating other patients and teaching time   Critical care was necessary to treat or prevent imminent or life-threatening deterioration of the following conditions:  Circulatory failure and sepsis   Critical care was time spent personally by me on the following activities:  Development of treatment plan with patient or surrogate, discussions with consultants, evaluation of patient's response to treatment, examination of patient, obtaining history from patient or surrogate, ordering and performing treatments and interventions, ordering and review of laboratory studies, ordering and review of radiographic studies, pulse oximetry, re-evaluation of patient's condition and review of old charts   I assumed direction of critical care for this patient from another provider in my  specialty: no      (including critical care time)  Medications Ordered in ED Medications  piperacillin-tazobactam (ZOSYN) IVPB 3.375 g (not administered)  vancomycin (VANCOCIN) IVPB 1000 mg/200 mL premix (not administered)  sodium chloride 0.9 % bolus 1,000 mL (0 mLs Intravenous Stopped 08/29/17 1817)    And  sodium chloride 0.9 % bolus 1,000 mL (0 mLs Intravenous Stopped 08/29/17 1818)    And  sodium chloride 0.9 % bolus 250  mL (0 mLs Intravenous Stopped 08/29/17 1817)  piperacillin-tazobactam (ZOSYN) IVPB 3.375 g (0 g Intravenous Stopped 08/29/17 1818)  vancomycin (VANCOCIN) IVPB 1000 mg/200 mL premix (0 mg Intravenous Stopped 08/29/17 1854)  acetaminophen (TYLENOL) suppository 650 mg (650 mg Rectal Given 08/29/17 1733)  .   Initial Impression / Assessment and Plan / ED Course  I have reviewed the triage vital signs and the nursing notes.  Pertinent labs & imaging results that were available during my care of the patient were reviewed by me and considered in my medical decision making (see chart for details).    84 year old gentleman here with fever, altered mental status.  On arrival, patient is febrile, altered, with lactic acid of 3.8.  Concern for severe sepsis.  Code sepsis initiated with broad-spectrum antibiotics.  Lab work shows leukocytosis as well as mild prerenal AK I.  Patient has had poor p.o. intake.  30 cc/kg fluid given.  CT scan without surgical normality.  Urinalysis consistent with UTI.  I suspect severe sepsis due to UTI.  After fluid resuscitation, sepsis reevaluation completed and the patient appears significantly improved.  He is now more awake and alert.  His mucous membranes are now moist.  His cap refill is improved.  Continue maintenance fluids, resuscitation, and will admit to hospitalist service.  Patient is full code.    Final Clinical Impressions(s) / ED Diagnoses   Final diagnoses:  Sepsis due to urinary tract infection ([place])  Transient alteration of awareness      ED Discharge Orders    None       Duffy Bruce, MD 08/29/17 2041    Duffy Bruce, MD 09/13/17 0800    Duffy Bruce, MD 09/13/17 [phone]

## 2017-08-29 NOTE — Progress Notes (Signed)
Pharmacy Antibiotic Note  [name] Cook is a 84 y.o. male admitted on 08/29/2017 with sepsis.  Pharmacy has been consulted for vancomycin and zosyn dosing.  Today, 08/29/2017 Tmax 102.4 WBC 14.4 SCr 1.77, CrCl ~30 ml/min Lactate 3.8  Plan: Zosyn 3.375gm IV q8h (4hr extended infusions) Vancomycin 1g IV q36h (estimated AUC 494 with SCr 1.77) Check levels at steady state, goal AUC 400-500 Follow up renal function & cultures  Height: 6\' 1"  (185.4 cm) Weight: 160 lb (72.6 kg) IBW/kg (Calculated) : 79.9  Temp (24hrs), Avg:100.6 F (38.1 C), Min:98.7 F (37.1 C), Max:102.4 F (39.1 C)  Recent Labs  Lab 08/29/17 1653 08/29/17 1733  WBC 14.4*  --   CREATININE 1.77*  --   LATICACIDVEN  --  3.80*    Estimated Creatinine Clearance: 29.6 mL/min (A) (by C-G formula based on SCr of 1.77 mg/dL (H)).    No Known Allergies  Antimicrobials this admission:  3/3 Vanc >> 3/3 Zosyn >>  Dose adjustments this admission:    Microbiology results:  3/3 BCx:  3/3 UCx:   Thank you for allowing pharmacy to be a part of this patient's care.  Peggyann Juba, PharmD, BCPS Pager: [phone] 08/29/2017 8:24 PM

## 2017-08-29 NOTE — ED Notes (Signed)
Dr. Jonnie Finner, hospitalist, at bedside.

## 2017-08-29 NOTE — ED Notes (Signed)
CRITICAL VALUE STICKER  CRITICAL VALUE: Lactic 3.80  RECEIVER (on-site recipient of call): Jake T RN (ran test)  DATE & TIME NOTIFIED: 08/29/17 1736  MESSENGER (representative from lab): [employer]  MD NOTIFIED: Ellender Hose MD  TIME OF NOTIFICATION: [id number]  RESPONSE: see orders

## 2017-08-30 ENCOUNTER — Ambulatory Visit: Payer: Medicare Other

## 2017-08-30 ENCOUNTER — Inpatient Hospital Stay (HOSPITAL_COMMUNITY): Payer: Medicare Other

## 2017-08-30 ENCOUNTER — Other Ambulatory Visit: Payer: Self-pay

## 2017-08-30 ENCOUNTER — Ambulatory Visit
Admission: RE | Admit: 2017-08-30 | Discharge: 2017-08-30 | Disposition: A | Discharge status: Home / Self Care | Payer: Medicare Other | Source: Ambulatory Visit | Attending: Radiation Oncology | Admitting: Radiation Oncology

## 2017-08-30 DIAGNOSIS — N179 Acute kidney failure, unspecified: Secondary | ICD-10-CM

## 2017-08-30 DIAGNOSIS — E785 Hyperlipidemia, unspecified: Secondary | ICD-10-CM

## 2017-08-30 DIAGNOSIS — F0280 Dementia in other diseases classified elsewhere without behavioral disturbance: Secondary | ICD-10-CM

## 2017-08-30 DIAGNOSIS — N131 Hydronephrosis with ureteral stricture, not elsewhere classified: Secondary | ICD-10-CM

## 2017-08-30 DIAGNOSIS — G20 Parkinson's disease: Secondary | ICD-10-CM

## 2017-08-30 DIAGNOSIS — R404 Transient alteration of awareness: Secondary | ICD-10-CM

## 2017-08-30 LAB — COMPREHENSIVE METABOLIC PANEL
ALBUMIN: 2.6 g/dL — AB (ref 3.5–5.0)
ALK PHOS: 400 U/L — AB (ref 38–126)
ALT: 49 U/L (ref 17–63)
ANION GAP: 13 (ref 5–15)
AST: 61 U/L — ABNORMAL HIGH (ref 15–41)
BUN: 28 mg/dL — ABNORMAL HIGH (ref 6–20)
CALCIUM: 8.3 mg/dL — AB (ref 8.9–10.3)
CO2: 29 mmol/L (ref 22–32)
Chloride: 91 mmol/L — ABNORMAL LOW (ref 101–111)
Creatinine, Ser: 1.55 mg/dL — ABNORMAL HIGH (ref 0.61–1.24)
GFR calc Af Amer: 44 mL/min — ABNORMAL LOW (ref >60)
GFR calc non Af Amer: 38 mL/min — ABNORMAL LOW (ref >60)
Glucose, Bld: 76 mg/dL (ref 65–99)
Potassium: 2.8 mmol/L — ABNORMAL LOW (ref 3.5–5.1)
SODIUM: 133 mmol/L — AB (ref 135–145)
Total Bilirubin: 1.2 mg/dL (ref 0.3–1.2)
Total Protein: 7.8 g/dL (ref 6.5–8.1)

## 2017-08-30 LAB — MAGNESIUM
MAGNESIUM: 2 mg/dL (ref 1.7–2.4)
Magnesium: 2.1 mg/dL (ref 1.7–2.4)

## 2017-08-30 LAB — MRSA PCR SCREENING: MRSA by PCR: NEGATIVE

## 2017-08-30 LAB — GLUCOSE, CAPILLARY
GLUCOSE-CAPILLARY: 121 mg/dL — AB (ref 65–99)
GLUCOSE-CAPILLARY: 152 mg/dL — AB (ref 65–99)
GLUCOSE-CAPILLARY: 81 mg/dL (ref 65–99)
GLUCOSE-CAPILLARY: 89 mg/dL (ref 65–99)
Glucose-Capillary: 96 mg/dL (ref 65–99)
Glucose-Capillary: 128 mg/dL — ABNORMAL HIGH (ref 65–99)

## 2017-08-30 LAB — CBC
HCT: 36.5 % — ABNORMAL LOW (ref 39.0–52.0)
HEMOGLOBIN: 12 g/dL — AB (ref 13.0–17.0)
MCH: 28.6 pg (ref 26.0–34.0)
MCHC: 32.9 g/dL (ref 30.0–36.0)
MCV: 87.1 fL (ref 78.0–100.0)
Platelets: 331 10*3/uL (ref 150–400)
RBC: 4.19 MIL/uL — ABNORMAL LOW (ref 4.22–5.81)
RDW: 13.8 % (ref 11.5–15.5)
WBC: 12.8 10*3/uL — ABNORMAL HIGH (ref 4.0–10.5)

## 2017-08-30 LAB — PROTEIN / CREATININE RATIO, URINE
Creatinine, Urine: 77.12 mg/dL
Protein Creatinine Ratio: 5.8 mg/mg{Cre} — ABNORMAL HIGH (ref 0.00–0.15)
Total Protein, Urine: 447 mg/dL

## 2017-08-30 LAB — NA AND K (SODIUM & POTASSIUM), RAND UR
Potassium Urine: 47 mmol/L
Sodium, Ur: 29 mmol/L

## 2017-08-30 LAB — TSH: TSH: 1.275 u[IU]/mL (ref 0.350–4.500)

## 2017-08-30 LAB — CORTISOL-AM, BLOOD: CORTISOL - AM: 54.9 ug/dL — AB (ref 6.7–22.6)

## 2017-08-30 LAB — PHOSPHORUS
Phosphorus: 3.9 mg/dL (ref 2.5–4.6)
Phosphorus: 3.1 mg/dL (ref 2.5–4.6)

## 2017-08-30 LAB — VITAMIN B12: Vitamin B-12: 921 pg/mL — ABNORMAL HIGH (ref 180–914)

## 2017-08-30 LAB — AMMONIA: Ammonia: 22 umol/L (ref 9–35)

## 2017-08-30 MED ORDER — POTASSIUM CHLORIDE CRYS ER 20 MEQ PO TBCR
40.0000 meq | EXTENDED_RELEASE_TABLET | Freq: Three times a day (TID) | ORAL | Status: AC
  Administered 2017-08-30 (×3): 40 meq via ORAL
  Filled 2017-08-30 (×3): qty 2

## 2017-08-30 MED ORDER — SODIUM CHLORIDE 0.9 % IV SOLN
INTRAVENOUS | Status: AC
  Administered 2017-08-30 – 2017-08-31 (×2): via INTRAVENOUS

## 2017-08-30 NOTE — Progress Notes (Signed)
PROGRESS NOTE  [name] Cook:[id number] DOB: 1927/03/16 DOA: 08/29/2017 PCP: System, Pcp Not In  HPI/Recap of past 24 hours: [name] Cook is an 84 yo male with PMH significant for recently diagnosed metastasized urothelial carcinoma (07/19/17) s/p TURBT on palliative radiation therapy, DM2, HTN, CAD s/p PCI with stents, dementia, chronic left hydronephrosis who presented to ED from SNF with altered mentation. Admitted for acute metabolic encephalopathy in the setting of sepsis 2/2 pyelonephritis. Code sepsis initiated in the ED with IV fluid and IV antibiotics.  08/30/17: seen and examined at bedside. Confused in the setting of dementia. Rectal pain. No diarrhea reported this am. No nausea.  Assessment/Plan: Active Problems:   Sepsis ([place])   Sepsis 2/2 acute pyelonephjroritis -leukocytosis trending down  -continue IV antilbiotics IV vancomycin and IV zosyn -continue to monitor fever curve  Hypokalemia -possibly 2/2 to GI losses -repleted with po K+ supplement and IV magnesium -will repeat K+and magnesium level in the morning  Hyponatremia, hypovolemic -Mild -Na+ 133 -continue IV fluid NS  AKI -baseline cr 0.77 -suspect prerenal -avoid nephrotoxic agents  Elevated AST, alk phosp -unclear etiology -trending down -repeat levels in the am  Bladder mass with 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 s/p TURBT on palliative radiation therapy -post radiation -follow up with oncology/radiation outpatient  HLD -continue crestor  Dementia without disturbances -reorient as needed Fall precaution  Chronic left hydronephrosis -continue to avoid nephrotoxic agents -monitor urine output    Code Status: full  Family Communication: none at bedside  Disposition Plan: will stay another midnight to continue IV fluid and IV antibiotics    Consultants:  none  Procedures:  none   Antimicrobials:  IV vanc IV zosyn  DVT prophylaxis:  heparin 5000  sq TID   Objective: Vitals:   08/29/17 2230 08/29/17 2300 08/30/17 0000 08/30/17 0412  BP: (!) 181/90 (!) 172/90 (!) 171/77 (!) 142/99  Pulse: 88 70 77 92  Resp: 18 18 18 16   Temp: (S) 98 F (36.7 C)  (!) 97.5 F (36.4 C) 97.6 F (36.4 C)  TempSrc: (S) Oral  Oral Oral  SpO2:  96% 94% 99%  Weight:      Height:        Intake/Output Summary (Last 24 hours) at 08/30/2017 0715 Last data filed at 08/30/2017 0640 Gross per 24 hour  Intake 3031.25 ml  Output 0 ml  Net 3031.25 ml   Filed Weights   08/29/17 1639  Weight: 72.6 kg (160 lb)    Exam:   General:  84 yo CM WD WN NAD Alert but confused in the setting of dementia  Cardiovascular: RRR no rubs or gallops   Respiratory: CTA no wheezes or rales  Abdomen: soft NT ND NBS x 4   Musculoskeletal: no focal abnormalities   Skin: No rash  Psychiatry: appropriate in the setting of dementia   Data Reviewed: CBC: Recent Labs  Lab 08/29/17 1653 08/30/17 0409  WBC 14.4* 12.8*  NEUTROABS 13.0*  --   HGB 12.3* 12.0*  HCT 37.6* 36.5*  MCV 88.1 87.1  PLT 369 956   Basic Metabolic Panel: Recent Labs  Lab 08/29/17 1653 08/30/17 0409  NA 132* 133*  K 4.8 2.8*  CL 87* 91*  CO2 30 29  GLUCOSE 151* 76  BUN 30* 28*  CREATININE 1.77* 1.55*  CALCIUM 8.8* 8.3*  MG 2.1 2.0  PHOS 3.9 3.1   GFR: Estimated Creatinine Clearance: 33.8 mL/min (A) (by C-G formula based on SCr of 1.55  mg/dL (H)). Liver Function Tests: Recent Labs  Lab 08/29/17 1653 08/30/17 0409  AST 72* 61*  ALT 53 49  ALKPHOS 455* 400*  BILITOT 1.3* 1.2  PROT 8.1 7.8  ALBUMIN 2.8* 2.6*   No results for input(s): LIPASE, AMYLASE in the last 168 hours. Recent Labs  Lab 08/30/17 0024  AMMONIA 22   Coagulation Profile: No results for input(s): INR, PROTIME in the last 168 hours. Cardiac Enzymes: No results for input(s): CKTOTAL, CKMB, CKMBINDEX, TROPONINI in the last 168 hours. BNP (last 3 results) No results for input(s): PROBNP in the last  8760 hours. HbA1C: No results for input(s): HGBA1C in the last 72 hours. CBG: Recent Labs  Lab 08/30/17 0004 08/30/17 0552  GLUCAP 152* 89   Lipid Profile: No results for input(s): CHOL, HDL, LDLCALC, TRIG, CHOLHDL, LDLDIRECT in the last 72 hours. Thyroid Function Tests: Recent Labs    08/30/17 0024  TSH 1.275   Anemia Panel: Recent Labs    08/30/17 0024  VITAMINB12 921*   Urine analysis:    Component Value Date/Time   COLORURINE YELLOW 08/29/2017 1653   APPEARANCEUR TURBID (A) 08/29/2017 1653   LABSPEC 1.016 08/29/2017 1653   PHURINE 6.0 08/29/2017 1653   GLUCOSEU NEGATIVE 08/29/2017 1653   HGBUR MODERATE (A) 08/29/2017 1653   BILIRUBINUR NEGATIVE 08/29/2017 1653   KETONESUR NEGATIVE 08/29/2017 1653   PROTEINUR 100 (A) 08/29/2017 1653   NITRITE NEGATIVE 08/29/2017 1653   LEUKOCYTESUR LARGE (A) 08/29/2017 1653   Sepsis Labs: @LABRCNTIP (procalcitonin:4,lacticidven:4)  ) Recent Results (from the past 240 hour(s))  MRSA PCR Screening     Status: None   Collection Time: 08/30/17 12:47 AM  Result Value Ref Range Status   MRSA by PCR NEGATIVE NEGATIVE Final    Comment:        The GeneXpert MRSA Assay (FDA approved for NASAL specimens only), is one component of a comprehensive MRSA colonization surveillance program. It is not intended to diagnose MRSA infection nor to guide or monitor treatment for MRSA infections. Performed at [facility], [place] [address], [place], [place] [address]       Studies: Ct Abdomen Pelvis Wo Contrast  Result Date: 08/29/2017 CLINICAL DATA:  Nausea, vomiting. Current history of bladder cancer. EXAM: CT ABDOMEN AND PELVIS WITHOUT CONTRAST TECHNIQUE: Multidetector CT imaging of the abdomen and pelvis was performed following the standard protocol without IV contrast. COMPARISON:  CT scan of August 09, 2017. FINDINGS: Lower chest: Increased bibasilar ill-defined densities are noted concerning for atelectasis or  possibly inflammation. Hepatobiliary: Right hepatic low density is again noted concerning for metastatic disease. Status post cholecystectomy. No biliary dilatation. Pancreas: Unremarkable. No pancreatic ductal dilatation or surrounding inflammatory changes. Spleen: Normal in size without focal abnormality. Adrenals/Urinary Tract: 3.7 cm left adrenal mass is noted concerning for possible metastatic lesion. Right adrenal gland appears normal. Stable small hyperdense cyst is seen in right kidney. Stable moderate left hydroureteronephrosis is noted without obstructing calculus. This most likely is due to obstruction due to tumor in the bladder. Large mass is seen involving the anterior and left side of the urinary bladder, consistent with history of bladder carcinoma. Stomach/Bowel: The stomach appears normal. There is no evidence of bowel obstruction or inflammation. Status post appendectomy. Sigmoid diverticulosis is noted without inflammation. Vascular/Lymphatic: Aortic atherosclerosis. No enlarged abdominal or pelvic lymph nodes. Reproductive: Crescent-shaped fluid collection is seen along the left side of the prostate gland which most likely represents postsurgical change consistent with history of trans urethral resection.  Other: No abdominal wall hernia or abnormality. No abdominopelvic ascites. Musculoskeletal: No acute or significant osseous findings. IMPRESSION: Increased bibasilar ill-defined densities are noted concerning for atelectasis or possibly inflammation. Right hepatic low density is noted concerning for metastatic disease. 3.7 cm left adrenal mass is noted concerning for metastatic disease. Stable moderate left hydroureteronephrosis is noted without obstructing calculus. This most likely is due to obstruction due to large bladder tumor, which is unchanged compared to prior exam and consistent with history of bladder carcinoma. Sigmoid diverticulosis is noted without inflammation. Aortic  Atherosclerosis (ICD10-I70.0). Electronically Signed   By: Marijo Conception, M.D.   On: 08/29/2017 19:14   Ct Head Wo Contrast  Result Date: 08/29/2017 CLINICAL DATA:  84 year old male with history of altered mental status. EXAM: CT HEAD WITHOUT CONTRAST TECHNIQUE: Contiguous axial images were obtained from the base of the skull through the vertex without intravenous contrast. COMPARISON:  Head CT 08/05/2012. FINDINGS: Brain: Moderate cerebral and mild cerebellar atrophy. Patchy and confluent areas of decreased attenuation are noted throughout the deep and periventricular white matter of the cerebral hemispheres bilaterally, compatible with chronic microvascular ischemic disease.No evidence of acute infarction, hemorrhage, hydrocephalus, extra-axial collection or mass lesion/mass effect. Vascular: No hyperdense vessel or unexpected calcification. Skull: Normal. Negative for fracture or focal lesion. Sinuses/Orbits: No acute finding. Other: None. IMPRESSION: 1. No acute intracranial abnormalities. 2. Moderate cerebral and mild cerebellar atrophy with chronic microvascular ischemic changes in the cerebral white matter, similar to the prior study, as above. Electronically Signed   By: Vinnie Langton M.D.   On: 08/29/2017 18:58   US Renal  Result Date: 08/30/2017 CLINICAL DATA:  Hydronephrosis. Review of radiologic records demonstrates history of bladder cancer. EXAM: RENAL / URINARY TRACT ULTRASOUND COMPLETE COMPARISON:  CT 6 hours prior.  CT 08/09/2017 FINDINGS: Right Kidney: Length: 11.0 cm. Mild prominence of the right renal pelvis without frank hydronephrosis. Echogenicity is normal. No evidence of mass. Left Kidney: Length: 12.6 cm. Moderate hydronephrosis is seen on prior CT. No shadowing stone. Bladder: Nondistended with irregular wall thickening measuring 5.8 x 1.7 x 3.6 cm with internal vascularity corresponding to mass on CT. IMPRESSION: 1. Moderate left hydronephrosis, similar to prior exams. No right  hydronephrosis. 2. Nondistended urinary bladder with irregular bladder mass, previously characterized on CT. Electronically Signed   By: Jeb Levering M.D.   On: 08/30/2017 01:18   Dg Chest Port 1 View  Result Date: 08/29/2017 CLINICAL DATA:  Altered mental status. EXAM: PORTABLE CHEST 1 VIEW COMPARISON:  Radiographs of December 12, 2016. FINDINGS: The heart size and mediastinal contours are within normal limits. Atherosclerosis of thoracic aorta is noted. No pneumothorax or pleural effusion is noted. Left lung is clear. Mild right basilar subsegmental atelectasis is noted. The visualized skeletal structures are unremarkable. IMPRESSION: Mild right basilar subsegmental atelectasis. Aortic Atherosclerosis (ICD10-I70.0). Electronically Signed   By: Marijo Conception, M.D.   On: 08/29/2017 17:42    Scheduled Meds: . docusate sodium  100 mg Oral BID  . heparin  5,000 Units Subcutaneous Q8H  . insulin aspart  0-9 Units Subcutaneous Q4H  . metoprolol tartrate  50 mg Oral BID  . polyethylene glycol  17 g Oral Daily  . rosuvastatin  20 mg Oral Daily  . senna  1 tablet Oral QHS    Continuous Infusions: . sodium chloride 75 mL/hr at 08/30/17 0015  . piperacillin-tazobactam (ZOSYN)  IV Stopped (08/30/17 0415)  . [START ON 08/31/2017] vancomycin       LOS:  1 day     Kayleen Memos, MD [facility] Pager [phone]  If 7PM-7AM, please contact night-coverage [email or web address] Password [email or web address] 08/30/2017, 7:15 AM

## 2017-08-30 NOTE — Evaluation (Signed)
Clinical/Bedside Swallow Evaluation Patient Details  Name: [name] Cook MRN: [id number] Date of Birth: 1926-07-04  Today's Date: 08/30/2017 Time: SLP Start Time (ACUTE ONLY): 0950 SLP Stop Time (ACUTE ONLY): 1007 SLP Time Calculation (min) (ACUTE ONLY): 17 min  Past Medical History:  Past Medical History:  Diagnosis Date  . Bladder cancer ([place])   . Coronary artery disease   . Dementia    mild  . Diabetes mellitus without complication ([place])   . HOH (hard of hearing)    does not wear hearing aides, but can hear you if you speak loudly  . Hypertension   . Skin cancer 2002   on face  . STEMI involving right coronary artery ([place]) 2008   S/P 2.5 x 20 mm Taxus DES RCA, med rx for 30% LAD, 80% OM1 & dCFX, EF 60%   Past Surgical History:  Past Surgical History:  Procedure Laterality Date  . APPENDECTOMY    . CATARACT EXTRACTION W/PHACO Left 06/10/2016   Procedure: CATARACT EXTRACTION PHACO AND INTRAOCULAR LENS PLACEMENT (IOC);  Surgeon: Estill Cotta, MD;  Location: [facility] ORS;  Service: Ophthalmology;  Laterality: Left;  Lot # [id number] H [place]: 01:04.0 AP% 25.5 CDE: 29.46  . CHOLECYSTECTOMY    . CORONARY ANGIOPLASTY WITH STENT PLACEMENT  2008   2.5 x 20 mm Taxus DES to 95% RCA, 80% dCFX, 80% OM1, 30% LAD > med rx, EF 60%  . EYE SURGERY    . HERNIA REPAIR     bilateral inguinal hernia  . PROSTATE SURGERY    . TRANSURETHRAL RESECTION OF BLADDER TUMOR N/A 07/19/2017   Procedure: TRANSURETHRAL RESECTION OF BLADDER TUMOR (TURBT);  Surgeon: Lucas Mallow, MD;  Location: [facility] ORS;  Service: Urology;  Laterality: N/A;   HPI:  84 y.o.malewith medical history significantfor recently diagnosed metastatic urothelial carcinoma s/p TURBT (06/2017) on palliative radiation therapy, T2DM, CAD s/p PCI with remote DES, HTN and dementia who presented from SNF with worsening confusion and decreased oral intake.Dx sepsis, delirium, prerenal AKI in setting of decreased PO intake.    Assessment /  Plan / Recommendation Clinical Impression  Pt presents with functional oropharyngeal swallow marked by prolonged but sufficient mastication, brisk swallow response, no s/s of aspiration.  Recommend resuming a regular diet, thin liquids, give meds whole in puree.  Our services will sign off.  SLP Visit Diagnosis: Dysphagia, unspecified (R13.10)    Aspiration Risk       Diet Recommendation   regular solids, thin liquids  Medication Administration: Whole meds with puree    Other  Recommendations Oral Care Recommendations: Oral care BID   Follow up Recommendations None      Frequency and Duration            Prognosis        Swallow Study   General Date of Onset: 08/29/17 HPI: 84 y.o.malewith medical history significantfor recently diagnosed metastatic urothelial carcinoma s/p TURBT (06/2017) on palliative radiation therapy, T2DM, CAD s/p PCI with remote DES, HTN and dementia who presented from SNF with worsening confusion and decreased oral intake.Dx sepsis, delirium, prerenal AKI in setting of decreased PO intake.  Type of Study: Bedside Swallow Evaluation Previous Swallow Assessment: no Diet Prior to this Study: NPO Temperature Spikes Noted: No Respiratory Status: Room air History of Recent Intubation: No Behavior/Cognition: Alert;Cooperative Oral Cavity Assessment: Within Functional Limits Oral Care Completed by SLP: Yes Oral Cavity - Dentition: Dentures, bottom Vision: Functional for self-feeding Self-Feeding Abilities: Able to feed self Patient Positioning: Upright  in bed Baseline Vocal Quality: Normal Volitional Cough: Strong Volitional Swallow: Able to elicit    Oral/Motor/Sensory Function Overall Oral Motor/Sensory Function: Within functional limits   Ice Chips Ice chips: Within functional limits   Thin Liquid Thin Liquid: Within functional limits    Nectar Thick Nectar Thick Liquid: Not tested   Honey Thick Honey Thick Liquid: Not tested   Puree Puree:  Within functional limits   Solid   GO   Solid: Within functional limits        [name] 08/30/2017,10:10 AM

## 2017-08-30 NOTE — Evaluation (Signed)
Physical Therapy Evaluation Patient Details Name: [name] Cook MRN: [id number] DOB: 10/04/1926 Today's Date: 08/30/2017   History of Present Illness  [name] Cook is an 84 yo male with PMH significant for recently diagnosed metastasized urothelial carcinoma (07/19/17) s/p TURBT on palliative radiation therapy, DM2, HTN, CAD s/p PCI with stents, dementia, chronic left hydronephrosis who presented to ED from SNF with altered mentation. Admitted for acute metabolic encephalopathy in the setting of sepsis 2/2 pyelonephritis  Clinical Impression  The patient is very weak. BP 196/75 with small cuff in bed. RN gave clearance to sitting at bed edge. Then  Assisted to chair as patient needed bed changed. BP 198  103. RN aware. Pt admitted with above diagnosis. Pt currently with functional limitations due to the deficits listed below (see PT Problem List).  Pt will benefit from skilled PT to increase their independence and safety with mobility to allow discharge to the venue listed below.     Follow Up Recommendations SNF    Equipment Recommendations  None recommended by PT    Recommendations for Other Services       Precautions / Restrictions Precautions Precautions: Fall      Mobility  Bed Mobility Overal bed mobility: Needs Assistance Bed Mobility: Supine to Sit;Sit to Supine     Supine to sit: Mod assist Sit to supine: Mod assist   General bed mobility comments: assist with legs and trunk  Transfers Overall transfer level: Needs assistance Equipment used: Rolling walker (2 wheeled) Transfers: Sit to/from [facility] Sit to Stand: Mod assist Stand pivot transfers: Mod assist       General transfer comment: Mod steady assist to rise and pivot to chair then back to bed. Incontinent upon standing.  Moves very slowly/  Ambulation/Gait                Stairs            Wheelchair Mobility    Modified Rankin (Stroke Patients Only)       Balance                                              Pertinent Vitals/Pain Pain Assessment: Faces Faces Pain Scale: Hurts even more Pain Location: rectum Pain Descriptors / Indicators: Discomfort;Grimacing Pain Intervention(s): Monitored during session    Home Living Family/patient expects to be discharged to:: Skilled nursing facility                      Prior Function                 Hand Dominance        Extremity/Trunk Assessment   Upper Extremity Assessment Upper Extremity Assessment: Generalized weakness    Lower Extremity Assessment Lower Extremity Assessment: Generalized weakness    Cervical / Trunk Assessment Cervical / Trunk Assessment: Normal  Communication      Cognition Arousal/Alertness: Awake/alert Behavior During Therapy: WFL for tasks assessed/performed Overall Cognitive Status: History of cognitive impairments - at baseline                                        General Comments      Exercises     Assessment/Plan    PT Assessment Patient needs  continued PT services  PT Problem List Decreased strength;Decreased activity tolerance;Decreased balance;Decreased mobility;Cardiopulmonary status limiting activity;Decreased safety awareness;Decreased knowledge of use of DME       PT Treatment Interventions DME instruction;Gait training;Functional mobility training;Therapeutic activities;Cognitive remediation;Patient/family education    PT Goals (Current goals can be found in the Care Plan section)  Acute Rehab PT Goals Patient Stated Goal: agreed to sitting PT Goal Formulation: With patient/family Time For Goal Achievement: 09/13/17 Potential to Achieve Goals: Fair    Frequency Min 2X/week   Barriers to discharge        Co-evaluation               AM-PAC PT "6 Clicks" Daily Activity  Outcome Measure Difficulty turning over in bed (including adjusting bedclothes, sheets and blankets)?:  Unable Difficulty moving from lying on back to sitting on the side of the bed? : Unable Difficulty sitting down on and standing up from a chair with arms (e.g., wheelchair, bedside commode, etc,.)?: Unable Help needed moving to and from a bed to chair (including a wheelchair)?: Total Help needed walking in hospital room?: Total Help needed climbing 3-5 steps with a railing? : Total 6 Click Score: 6    End of Session Equipment Utilized During Treatment: Gait belt Activity Tolerance: Patient tolerated treatment well Patient left: in bed;with call bell/phone within reach;with bed alarm set;with nursing/sitter in room Nurse Communication: Mobility status PT Visit Diagnosis: Unsteadiness on feet (R26.81)    Time: 0160-1093 PT Time Calculation (min) (ACUTE ONLY): 38 min   Charges:     PT Treatments $Therapeutic Activity: 8-22 mins $Self Care/Home Management: 8-22   PT G CodesTresa Cook PT [phone]   [name] Cook 08/30/2017, 5:13 PM

## 2017-08-30 NOTE — Progress Notes (Signed)
[facility] Therapy Treatment Record Phone [phone]   Radiation Therapy was administered to [name] Cook on: 08/30/2017  2:06 PM and was treatment # 4out of a planned course of 10 treatments.  Radiation Treatment  1). Beam photons with 6-10 energy  2). Brachytherapy None  3). Stereotactic Radiosurgery None  4). Other Radiation None     Navreet Bolda, RT (T)

## 2017-08-30 NOTE — Clinical Social Work Note (Signed)
Clinical Social Work Assessment  Patient Details  Name: [name] Cook MRN: [id number] Date of Birth: 02-24-1927  Date of referral:  08/30/17               Reason for consult:  (pt admitted from facility)                Permission sought to share information with:    Permission granted to share information::     Name::     daughter [name] Cook  Agency::     Relationship::     Contact Information:     Housing/Transportation Living arrangements for the past 2 months:  Assisted Living Facility(pt has been at [facility] for rehab since 07/20/17) Source of Information:  Facility, Medical Team Patient Interpreter Needed:  None Criminal Activity/Legal Involvement Pertinent to Current Situation/Hospitalization:  No - Comment as needed Significant Relationships:  Adult Children, Warehouse manager Lives with:  Facility Resident Do you feel safe going back to the place where you live?  Yes Need for family participation in patient care:  Yes (Comment)(pt with dementia- per past assessments daughter primary decision maker)  Care giving concerns:  Pt admitted from [facility] SNF- has been there for rehab since 07/20/17. Previously was resident of [facility] per chart. Currently admitted for acute metabolic encephalopathy in the setting of sepsis 2/2 pyelonephritis. Per review of chart also patient of [facility] receiving palliative radiation for recently diagnosed metastasized urothelial carcinoma (07/19/17)  Social Worker assessment / plan:  CSW following for assistance with disposition as pt is admitted from a facility- has been at [facility] SNF since 1/22 and prior to that was at [facility]-see above.  Pt sleeping- unable to gather information. Left voicemail for daughter to gather care information and will follow up with her and pt. Per [facility], pt has been there 45 days for rehab. PT eval ordered and CSW will appreciate assistance in determining pt's level of need once closer to DC. Will  follow and assist.   Employment status:  Retired Forensic scientist:  Medicare PT Recommendations:  Not assessed at this time(pending) Information / Referral to community resources:     Patient/Family's Response to care:  UTA  Patient/Family's Understanding of and Emotional Response to Diagnosis, Current Treatment, and Prognosis:  UTA  Emotional Assessment Appearance:  Appears stated age Attitude/Demeanor/Rapport:  (sleeping) Affect (typically observed):  Calm Orientation:  (UTA) Alcohol / Substance use:  Not Applicable Psych involvement (Current and /or in the community):  No (Comment)  Discharge Needs  Concerns to be addressed:  Care Coordination, Decision making concerns, Discharge Planning Concerns Readmission within the last 30 days:  No Current discharge risk:  (still assessing) Barriers to Discharge:  Continued Medical Work up   [employer], LCSW 08/30/2017, 3:43 PM [phone]

## 2017-08-30 NOTE — Progress Notes (Signed)
Phoned Legrand Rams, RN caring for patient on [place], to inquire if patient is stable for radiation treatment. She reports he is. Will discuss recent labs and admitting diagnosis with [name], PA-C before sending transporters up to retrieve patient for treatment.

## 2017-08-30 NOTE — Progress Notes (Signed)
Per [name], PA-C patient can receive radiation therapy today. Informed Emily, RT on L1 of this finding. Raquel Sarna expressed her intentions to send Nicki Reaper, transporter, up to retrieve patient very soon.

## 2017-08-31 ENCOUNTER — Ambulatory Visit: Payer: Medicare Other

## 2017-08-31 ENCOUNTER — Ambulatory Visit
Admission: RE | Admit: 2017-08-31 | Discharge: 2017-08-31 | Disposition: A | Discharge status: Home / Self Care | Payer: Medicare Other | Source: Ambulatory Visit | Attending: Radiation Oncology | Admitting: Radiation Oncology

## 2017-08-31 DIAGNOSIS — F0281 Dementia in other diseases classified elsewhere with behavioral disturbance: Secondary | ICD-10-CM

## 2017-08-31 DIAGNOSIS — N183 Chronic kidney disease, stage 3 (moderate): Secondary | ICD-10-CM

## 2017-08-31 DIAGNOSIS — N1330 Unspecified hydronephrosis: Secondary | ICD-10-CM

## 2017-08-31 LAB — CREATININE, SERUM
CREATININE: 1.45 mg/dL — AB (ref 0.61–1.24)
GFR calc non Af Amer: 41 mL/min — ABNORMAL LOW (ref >60)
GFR, EST AFRICAN AMERICAN: 48 mL/min — AB (ref >60)

## 2017-08-31 LAB — GLUCOSE, CAPILLARY
GLUCOSE-CAPILLARY: 78 mg/dL (ref 65–99)
Glucose-Capillary: 81 mg/dL (ref 65–99)
Glucose-Capillary: 97 mg/dL (ref 65–99)
Glucose-Capillary: 168 mg/dL — ABNORMAL HIGH (ref 65–99)
Glucose-Capillary: 122 mg/dL — ABNORMAL HIGH (ref 65–99)
Glucose-Capillary: 254 mg/dL — ABNORMAL HIGH (ref 65–99)

## 2017-08-31 LAB — CBC
HEMATOCRIT: 36.3 % — AB (ref 39.0–52.0)
HEMOGLOBIN: 11.8 g/dL — AB (ref 13.0–17.0)
MCH: 28.5 pg (ref 26.0–34.0)
MCHC: 32.5 g/dL (ref 30.0–36.0)
MCV: 87.7 fL (ref 78.0–100.0)
Platelets: 313 10*3/uL (ref 150–400)
RBC: 4.14 MIL/uL — ABNORMAL LOW (ref 4.22–5.81)
RDW: 13.8 % (ref 11.5–15.5)
WBC: 9.3 10*3/uL (ref 4.0–10.5)

## 2017-08-31 LAB — BASIC METABOLIC PANEL
ANION GAP: 15 (ref 5–15)
BUN: 28 mg/dL — ABNORMAL HIGH (ref 6–20)
CO2: 24 mmol/L (ref 22–32)
Calcium: 7.9 mg/dL — ABNORMAL LOW (ref 8.9–10.3)
Chloride: 97 mmol/L — ABNORMAL LOW (ref 101–111)
Creatinine, Ser: 1.57 mg/dL — ABNORMAL HIGH (ref 0.61–1.24)
GFR calc Af Amer: 44 mL/min — ABNORMAL LOW (ref >60)
GFR calc non Af Amer: 38 mL/min — ABNORMAL LOW (ref >60)
GLUCOSE: 84 mg/dL (ref 65–99)
POTASSIUM: 3.3 mmol/L — AB (ref 3.5–5.1)
Sodium: 136 mmol/L (ref 135–145)

## 2017-08-31 LAB — PHOSPHORUS: Phosphorus: 2.6 mg/dL (ref 2.5–4.6)

## 2017-08-31 LAB — MAGNESIUM: MAGNESIUM: 1.9 mg/dL (ref 1.7–2.4)

## 2017-08-31 LAB — CALCIUM / CREATININE RATIO, URINE
CALCIUM UR: 4 mg/dL
CALCIUM/CREAT. RATIO: 56 mg/g{creat} (ref 0–260)
CREATININE, UR: 71.5 mg/dL

## 2017-08-31 MED ORDER — MAGNESIUM SULFATE 2 GM/50ML IV SOLN
2.0000 g | Freq: Once | INTRAVENOUS | Status: AC
Start: 2017-08-31 — End: 2017-08-31
  Administered 2017-08-31: 2 g via INTRAVENOUS
  Filled 2017-08-31: qty 50

## 2017-08-31 MED ORDER — POTASSIUM CHLORIDE CRYS ER 20 MEQ PO TBCR
40.0000 meq | EXTENDED_RELEASE_TABLET | Freq: Two times a day (BID) | ORAL | Status: AC
  Administered 2017-08-31 (×2): 40 meq via ORAL
  Filled 2017-08-31 (×2): qty 2

## 2017-08-31 MED ORDER — AMLODIPINE BESYLATE 5 MG PO TABS
5.0000 mg | ORAL_TABLET | Freq: Every day | ORAL | Status: DC
  Administered 2017-08-31 – 2017-09-03 (×4): 5 mg via ORAL
  Filled 2017-08-31 (×4): qty 1

## 2017-08-31 MED ORDER — HYDROCORTISONE ACETATE 25 MG RE SUPP
25.0000 mg | Freq: Two times a day (BID) | RECTAL | Status: DC | PRN
  Administered 2017-08-31: 25 mg via RECTAL
  Filled 2017-08-31 (×2): qty 1

## 2017-08-31 NOTE — Progress Notes (Signed)
CSW following to assist with disposition- pt admitted from [facility] SNF- he has been there since 07/20/17 DC from [place]. Prior to that lived in [place] (see previous assessments) Spoke with pt's daughter today re: plans at DC. She states she did hold bed at [facility] and will plan for pt to return at DC. Are considering him transitioning there for long term care as well. (Currently in medicare copay days for Skilled Rehab).  Will complete FL2 once closer to DC so that appropriate care needs can be documented. Will communicate with facility as well.  Sharren Bridge, MSW, LCSW Clinical Social Work 08/31/2017 [phone]

## 2017-08-31 NOTE — Progress Notes (Signed)
PROGRESS NOTE  [name] Cook [id number]:[id number] DOB: 26-Mar-1927 DOA: 08/29/2017 PCP: System, Pcp Not In  HPI/Recap of past 24 hours: [name] Cook is an 83 yo male with PMH significant for recently diagnosed metastasized urothelial carcinoma (07/19/17) s/p TURBT on palliative radiation therapy, DM2, HTN, CAD s/p PCI with stents, dementia, chronic left hydronephrosis who presented to ED from SNF with altered mentation. Admitted for acute metabolic encephalopathy in the setting of sepsis 2/2 pyelonephritis. Code sepsis initiated in the ED with IV fluid and IV antibiotics.  08/30/17: Confused in the setting of dementia. Rectal pain. No diarrhea reported this am. No nausea. Radiation therapy administered in the afternoon  08/31/17: Anusol added for rectal pain. On IV antibiotics for pyelonephritis. Awaiting results of urine culture to deescalate antibiotics.  Assessment/Plan: Active Problems:   Sepsis ([place])   Sepsis 2/2 acute pyelonephritis -no leukocytosis, afebrile in the last 24 hrs -continue  IV zosyn -continue to monitor fever curve -blood cx no growth 1 day -urine cx more than 100,000 colonies staph aureus, sensitivities pending -MRSA negative  Hypokalemia -K+ 3.3 -repleted with po 40 meq bid x 2 doses -added IV magnesium -possibly 2/2 to GI losses  Hyponatremia, hypovolemic, resolved -Mild -Na+ 136 from 133 -continue IV fluid NS  AKI -baseline cr 0.77 -cr 1.57 from 1.49 -suspect prerenal -avoid nephrotoxic agents, hypotension or dehydration  Elevated AST, alk phosp, improving -unclear etiology -AST and alk phosp continues to trend down  Bladder mass with 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 s/p TURBT on palliative radiation therapy -post radiation 08/30/17 -follow up with oncology/radiation outpatient  HLD -continue crestor  Dementia with mild disturbances -reorient as needed -Fall precaution  Chronic left hydronephrosis -continue to avoid  nephrotoxic agents -monitor urine output    Code Status: full  Family Communication: none at bedside  Disposition Plan: will stay another midnight to continue IV fluid and IV antibiotics    Consultants:  none  Procedures:  none   Antimicrobials:  IV zosyn  DVT prophylaxis:  heparin 5000 sq TID   Objective: Vitals:   08/30/17 0412 08/30/17 1350 08/30/17 2225 08/31/17 0547  BP: (!) 142/99 (!) 181/97 (!) 186/78 (!) 191/72  Pulse: 92 61 (!) 45 (!) 51  Resp: 16 16 17 16   Temp: 97.6 F (36.4 C) 97.7 F (36.5 C) 97.7 F (36.5 C) 98.3 F (36.8 C)  TempSrc: Oral Oral Oral Oral  SpO2: 99% 97% 97% 95%  Weight:      Height:        Intake/Output Summary (Last 24 hours) at 08/31/2017 0712 Last data filed at 08/30/2017 1700 Gross per 24 hour  Intake 863.75 ml  Output -  Net 863.75 ml   Filed Weights   08/29/17 1639  Weight: 72.6 kg (160 lb)    Exam: 08/31/17 pt seen and examined. Physical is essentially unchanged from yesterday.   General:  84 yo CM WD WN NAD Alert but confused in the setting of dementia  Cardiovascular: RRR no rubs or gallops   Respiratory: CTA no wheezes or rales  Abdomen: soft NT ND NBS x 4   Musculoskeletal: no focal abnormalities   Skin: No rash  Psychiatry: appropriate in the setting of dementia   Data Reviewed: CBC: Recent Labs  Lab 08/29/17 1653 08/30/17 0409  WBC 14.4* 12.8*  NEUTROABS 13.0*  --   HGB 12.3* 12.0*  HCT 37.6* 36.5*  MCV 88.1 87.1  PLT 369 825   Basic Metabolic Panel: Recent Labs  Lab  08/29/17 1653 08/30/17 0409 08/31/17 0421  NA 132* 133*  --   K 4.8 2.8*  --   CL 87* 91*  --   CO2 30 29  --   GLUCOSE 151* 76  --   BUN 30* 28*  --   CREATININE 1.77* 1.55* 1.45*  CALCIUM 8.8* 8.3*  --   MG 2.1 2.0 1.9  PHOS 3.9 3.1 2.6   GFR: Estimated Creatinine Clearance: 36.2 mL/min (A) (by C-G formula based on SCr of 1.45 mg/dL (H)). Liver Function Tests: Recent Labs  Lab 08/29/17 1653 08/30/17 0409    AST 72* 61*  ALT 53 49  ALKPHOS 455* 400*  BILITOT 1.3* 1.2  PROT 8.1 7.8  ALBUMIN 2.8* 2.6*   No results for input(s): LIPASE, AMYLASE in the last 168 hours. Recent Labs  Lab 08/30/17 0024  AMMONIA 22   Coagulation Profile: No results for input(s): INR, PROTIME in the last 168 hours. Cardiac Enzymes: No results for input(s): CKTOTAL, CKMB, CKMBINDEX, TROPONINI in the last 168 hours. BNP (last 3 results) No results for input(s): PROBNP in the last 8760 hours. HbA1C: No results for input(s): HGBA1C in the last 72 hours. CBG: Recent Labs  Lab 08/30/17 1156 08/30/17 1600 08/30/17 2007 08/31/17 0021 08/31/17 0427  GLUCAP 128* 81 121* 78 81   Lipid Profile: No results for input(s): CHOL, HDL, LDLCALC, TRIG, CHOLHDL, LDLDIRECT in the last 72 hours. Thyroid Function Tests: Recent Labs    08/30/17 0024  TSH 1.275   Anemia Panel: Recent Labs    08/30/17 0024  VITAMINB12 921*   Urine analysis:    Component Value Date/Time   COLORURINE YELLOW 08/29/2017 1653   APPEARANCEUR TURBID (A) 08/29/2017 1653   LABSPEC 1.016 08/29/2017 1653   PHURINE 6.0 08/29/2017 1653   GLUCOSEU NEGATIVE 08/29/2017 1653   HGBUR MODERATE (A) 08/29/2017 1653   BILIRUBINUR NEGATIVE 08/29/2017 1653   KETONESUR NEGATIVE 08/29/2017 1653   PROTEINUR 100 (A) 08/29/2017 1653   NITRITE NEGATIVE 08/29/2017 1653   LEUKOCYTESUR LARGE (A) 08/29/2017 1653   Sepsis Labs: @LABRCNTIP (procalcitonin:4,lacticidven:4)  ) Recent Results (from the past 240 hour(s))  Urine culture     Status: None (Preliminary result)   Collection Time: 08/29/17  4:53 PM  Result Value Ref Range Status   Specimen Description   Final    URINE, CLEAN CATCH Performed at [facility], [place] [address], [place], [place] [address]    Special Requests   Final    NONE Performed at [facility], [place] [address] [place], [place] [address]    Culture   Final    CULTURE REINCUBATED FOR BETTER  GROWTH Performed at [facility], [place] [address], [place], [place] [address]    Report Status PENDING  Incomplete  MRSA PCR Screening     Status: None   Collection Time: 08/30/17 12:47 AM  Result Value Ref Range Status   MRSA by PCR NEGATIVE NEGATIVE Final    Comment:        The GeneXpert MRSA Assay (FDA approved for NASAL specimens only), is one component of a comprehensive MRSA colonization surveillance program. It is not intended to diagnose MRSA infection nor to guide or monitor treatment for MRSA infections. Performed at [facility], [place] [address] [place], [place] [address]       Studies: No results found.  Scheduled Meds: . amLODipine  5 mg Oral Daily  . docusate sodium  100 mg Oral BID  .  heparin  5,000 Units Subcutaneous Q8H  . insulin aspart  0-9 Units Subcutaneous Q4H  . metoprolol tartrate  50 mg Oral BID  . polyethylene glycol  17 g Oral Daily  . rosuvastatin  20 mg Oral Daily  . senna  1 tablet Oral QHS    Continuous Infusions: . sodium chloride 75 mL/hr at 08/30/17 1449  . piperacillin-tazobactam (ZOSYN)  IV Stopped (08/31/17 0547)     LOS: 2 days     Kayleen Memos, MD [facility] Pager [phone]  If 7PM-7AM, please contact night-coverage [email or web address] Password [email or web address] 08/31/2017, 7:12 AM

## 2017-09-01 ENCOUNTER — Ambulatory Visit
Admission: RE | Admit: 2017-09-01 | Discharge: 2017-09-01 | Disposition: A | Discharge status: Home / Self Care | Payer: Medicare Other | Source: Ambulatory Visit | Attending: Radiation Oncology | Admitting: Radiation Oncology

## 2017-09-01 ENCOUNTER — Ambulatory Visit: Payer: Medicare Other

## 2017-09-01 DIAGNOSIS — E876 Hypokalemia: Secondary | ICD-10-CM

## 2017-09-01 LAB — GLUCOSE, CAPILLARY
Glucose-Capillary: 89 mg/dL (ref 65–99)
Glucose-Capillary: 113 mg/dL — ABNORMAL HIGH (ref 65–99)
Glucose-Capillary: 123 mg/dL — ABNORMAL HIGH (ref 65–99)
Glucose-Capillary: 137 mg/dL — ABNORMAL HIGH (ref 65–99)
Glucose-Capillary: 158 mg/dL — ABNORMAL HIGH (ref 65–99)
Glucose-Capillary: 116 mg/dL — ABNORMAL HIGH (ref 65–99)

## 2017-09-01 LAB — URINE CULTURE: Culture: >=100000 — AB

## 2017-09-01 LAB — CREATININE, SERUM
CREATININE: 1.58 mg/dL — AB (ref 0.61–1.24)
GFR calc non Af Amer: 37 mL/min — ABNORMAL LOW (ref >60)
GFR, EST AFRICAN AMERICAN: 43 mL/min — AB (ref >60)

## 2017-09-01 LAB — MAGNESIUM: MAGNESIUM: 2.4 mg/dL (ref 1.7–2.4)

## 2017-09-01 LAB — PHOSPHORUS: Phosphorus: 2.5 mg/dL (ref 2.5–4.6)

## 2017-09-01 MED ORDER — AMOXICILLIN-POT CLAVULANATE 875-125 MG PO TABS
1.0000 | ORAL_TABLET | Freq: Two times a day (BID) | ORAL | Status: DC
  Administered 2017-09-01 – 2017-09-03 (×4): 1 via ORAL
  Filled 2017-09-01 (×4): qty 1

## 2017-09-01 MED ORDER — HYDROCORTISONE ACETATE 25 MG RE SUPP
25.0000 mg | Freq: Two times a day (BID) | RECTAL | Status: DC
  Administered 2017-09-01 – 2017-09-03 (×5): 25 mg via RECTAL
  Filled 2017-09-01 (×5): qty 1

## 2017-09-01 NOTE — Progress Notes (Signed)
[facility] PROGRESS NOTE  [name] Cook ZJQ:[id number] DOB: 08-13-1926 DOA: 08/29/2017  PCP: System, Pcp Not In  Brief History/Interval Summary: [name] Cook is an 84 yo male with PMH significant for recently diagnosed metastasized urothelial carcinoma (07/19/17) s/p TURBT on palliative radiation therapy, DM2, HTN, CAD s/p PCI with stents, dementia, chronic left hydronephrosis who presented to ED from SNF with altered mentation. Admitted for acute metabolic encephalopathy in the setting of sepsis 2/2 pyelonephritis. Code sepsis initiated in the ED with IV fluid and IV antibiotics.  Reason for Visit: Acute pyelonephritis  Consultants: Radiation oncology  Procedures: Radiation treatments  Antibiotics: Zosyn  Subjective/Interval History: Patient seen after he came back from high-dose radiation treatment.  He complains of pain in his rectal area.  No nausea vomiting.  No difficulty breathing.  ROS: Denies any abdominal pain.  Objective:  Vital Signs  Vitals:   08/31/17 1145 08/31/17 1940 09/01/17 0445 09/01/17 1445  BP: (!) 193/85 (!) 169/67 (!) 162/98 (!) 147/88  Pulse: 99 74 73 94  Resp: 18 20 16 16   Temp: 98 F (36.7 C) 98.7 F (37.1 C) 97.8 F (36.6 C) 98.2 F (36.8 C)  TempSrc: Oral Oral Oral Oral  SpO2: 100% 98% 96% 98%  Weight:      Height:        Intake/Output Summary (Last 24 hours) at 09/01/2017 1456 Last data filed at 09/01/2017 1435 Gross per 24 hour  Intake 2295 ml  Output 1700 ml  Net 595 ml   Filed Weights   08/29/17 1639  Weight: 72.6 kg (160 lb)    General appearance: alert, cooperative and no distress Head: Normocephalic, without obvious abnormality, atraumatic Resp: clear to auscultation bilaterally Cardio: regular rate and rhythm, S1, S2 normal, no murmur, click, rub or gallop GI: soft, non-tender; bowel sounds normal; no masses,  no organomegaly Greenish stool noted.  Mild erythema around the anus.  No bleeding noted. Extremities:  extremities normal, atraumatic, no cyanosis or edema Neurologic: No obvious focal neurological deficits.  Lab Results:  Data Reviewed: I have personally reviewed following labs and imaging studies  CBC: Recent Labs  Lab 08/29/17 1653 08/30/17 0409 08/31/17 0421  WBC 14.4* 12.8* 9.3  NEUTROABS 13.0*  --   --   HGB 12.3* 12.0* 11.8*  HCT 37.6* 36.5* 36.3*  MCV 88.1 87.1 87.7  PLT 369 331 403    Basic Metabolic Panel: Recent Labs  Lab 08/29/17 1653 08/30/17 0409 08/31/17 0421 09/01/17 0354  NA 132* 133* 136  --   K 4.8 2.8* 3.3*  --   CL 87* 91* 97*  --   CO2 30 29 24   --   GLUCOSE 151* 76 84  --   BUN 30* 28* 28*  --   CREATININE 1.77* 1.55* 1.57*  1.45* 1.58*  CALCIUM 8.8* 8.3* 7.9*  --   MG 2.1 2.0 1.9 2.4  PHOS 3.9 3.1 2.6 2.5    GFR: Estimated Creatinine Clearance: 33.2 mL/min (A) (by C-G formula based on SCr of 1.58 mg/dL (H)).  Liver Function Tests: Recent Labs  Lab 08/29/17 1653 08/30/17 0409  AST 72* 61*  ALT 53 49  ALKPHOS 455* 400*  BILITOT 1.3* 1.2  PROT 8.1 7.8  ALBUMIN 2.8* 2.6*     Recent Labs  Lab 08/30/17 0024  AMMONIA 22    CBG: Recent Labs  Lab 08/31/17 1957 09/01/17 0135 09/01/17 0442 09/01/17 0751 09/01/17 1223  GLUCAP 254* 89 113* 123* 137*    Thyroid Function  Tests: Recent Labs    08/30/17 0024  TSH 1.275    Anemia Panel: Recent Labs    08/30/17 0024  VITAMINB12 921*    Recent Results (from the past 240 hour(s))  Blood Culture (routine x 2)     Status: None (Preliminary result)   Collection Time: 08/29/17  4:53 PM  Result Value Ref Range Status   Specimen Description   Final    RIGHT ANTECUBITAL Performed at [facility] [address] [place], [place] [address]    Special Requests   Final    BOTTLES DRAWN AEROBIC AND ANAEROBIC Blood Culture adequate volume Performed at [place] [address] [place], [place] [address]    Culture   Final    NO GROWTH  2 DAYS Performed at [place] [address], [place], [place] [address]    Report Status PENDING  Incomplete  Urine culture     Status: Abnormal   Collection Time: 08/29/17  4:53 PM  Result Value Ref Range Status   Specimen Description   Final    URINE, CLEAN CATCH Performed at [facility], [place] [address] [place], [place] [address]    Special Requests   Final    NONE Performed at [facility], [place] [address], [place], [place] [address]    Culture >=100,000 COLONIES/mL STAPHYLOCOCCUS AUREUS (A)  Final   Report Status 09/01/2017 FINAL  Final   Organism ID, Bacteria STAPHYLOCOCCUS AUREUS (A)  Final      Susceptibility   Staphylococcus aureus - MIC*    CIPROFLOXACIN <=0.5 SENSITIVE Sensitive     GENTAMICIN <=0.5 SENSITIVE Sensitive     NITROFURANTOIN <=16 SENSITIVE Sensitive     OXACILLIN 0.5 SENSITIVE Sensitive     TETRACYCLINE <=1 SENSITIVE Sensitive     VANCOMYCIN <=0.5 SENSITIVE Sensitive     TRIMETH/SULFA <=10 SENSITIVE Sensitive     CLINDAMYCIN <=0.25 SENSITIVE Sensitive     RIFAMPIN <=0.5 SENSITIVE Sensitive     Inducible Clindamycin NEGATIVE Sensitive     * >=100,000 COLONIES/mL STAPHYLOCOCCUS AUREUS  Blood Culture (routine x 2)     Status: None (Preliminary result)   Collection Time: 08/29/17  4:58 PM  Result Value Ref Range Status   Specimen Description   Final    LEFT ANTECUBITAL Performed at [place] [address], [place], [place] [address]    Special Requests   Final    BOTTLES DRAWN AEROBIC AND ANAEROBIC Blood Culture adequate volume Performed at [place] [address], [place], [place] [address]    Culture   Final    NO GROWTH 2 DAYS Performed at [place] [address], [place], [place] [address]    Report Status PENDING  Incomplete  MRSA PCR Screening     Status: None   Collection Time: 08/30/17 12:47 AM  Result Value Ref Range Status   MRSA  by PCR NEGATIVE NEGATIVE Final    Comment:        The GeneXpert MRSA Assay (FDA approved for NASAL specimens only), is one component of a comprehensive MRSA colonization surveillance program. It is not intended to diagnose MRSA infection nor to guide or monitor treatment for MRSA infections. Performed at [facility], [place] [address] [place], [place] [address]       Radiology Studies: No results found.   Medications:  Scheduled: . amLODipine  5 mg Oral Daily  .  docusate sodium  100 mg Oral BID  . heparin  5,000 Units Subcutaneous Q8H  . insulin aspart  0-9 Units Subcutaneous Q4H  . metoprolol tartrate  50 mg Oral BID  . polyethylene glycol  17 g Oral Daily  . rosuvastatin  20 mg Oral Daily  . senna  1 tablet Oral QHS   Continuous: . sodium chloride 75 mL/hr at 08/31/17 1206  . piperacillin-tazobactam (ZOSYN)  IV 3.375 g (09/01/17 4290)   PPN:DLOPRAFOADLKZ **OR** acetaminophen, hydrocortisone, ondansetron **OR** ondansetron (ZOFRAN) IV, oxyCODONE  Assessment/Plan:  Active Problems:   Sepsis ([place])      Sepsis 2/2 acute pyelonephritis Patient has been afebrile.  WBC is normal.  Sepsis physiology resolved.  Urine culture positive for MSSA.  Most likely secondary to recent instrumentation.  Patient was initially placed on IV Zosyn.  We will change him over to Augmentin.  Hypokalemia Potassium was repleted yesterday.  We will repeat labs tomorrow.  Hyponatremia, hypovolemic This was mild.  Resolved.  Cut back on IV fluids.  AKI -baseline cr 0.77 Creatinine remains mildly elevated.  Monitor urine output.  Elevated AST, alk phosp Etiology unclear but levels are stable.  Outpatient monitoring.  Recently diagnosed high grade invasive urothelilal carcinoma s/p TURBT on palliative radiation therapy -Receiving radiation treatment.  Rectal pain probably due to effects of radiation.  Use steroid suppository.    Hyperlipidemia -continue  crestor  Dementia with mild disturbances -reorient as needed -Fall precaution  Chronic left hydronephrosis -continue to avoid nephrotoxic agents -monitor urine output Follow-up with urology.    DVT Prophylaxis: Subutaneous heparin    Code Status: Full code Family Communication: No family at bedside Disposition Plan: Hopefully to skilled nursing facility tomorrow.    LOS: 3 days   [facility] Pager [phone] 09/01/2017, 2:56 PM  If 7PM-7AM, please contact night-coverage at [email or web address], password [facility]

## 2017-09-01 NOTE — Care Management Important Message (Signed)
Important Message  Patient Details  Name: [name] Cook MRN: [id number] Date of Birth: 02/05/1927   Medicare Important Message Given:  Yes    Kerin Salen 09/01/2017, 11:32 AMImportant Message  Patient Details  Name: [name] Cook MRN: [id number] Date of Birth: 1926/11/17   Medicare Important Message Given:  Yes    Kerin Salen 09/01/2017, 11:32 AM

## 2017-09-01 NOTE — Progress Notes (Signed)
Pharmacy Antibiotic Note  [name] Cook is a 84 y.o. male admitted on 08/29/2017 with sepsis.  Pharmacy has been consulted for vancomycin and zosyn dosing.  Today, 09/01/2017 - Day 4 Zosyn - Afebrile. WBC improved. SCr 1.58 stable CrCl 33 - MSSA UTI  Plan: 1) Continue current Zosyn dosing per current renal function 2) For MSSA and further treatment of pyelo, recommend narrow Zosyn to Augmentin 875mg  PO BID for CrCl > 30 ml/min   Height: 6\' 1"  (185.4 cm) Weight: 160 lb (72.6 kg) IBW/kg (Calculated) : 79.9  Temp (24hrs), Avg:98.2 F (36.8 C), Min:97.8 F (36.6 C), Max:98.7 F (37.1 C)  Recent Labs  Lab 08/29/17 1653 08/29/17 1733 08/29/17 2045 08/30/17 0409 08/31/17 0421 09/01/17 0354  WBC 14.4*  --   --  12.8* 9.3  --   CREATININE 1.77*  --   --  1.55* 1.57*  1.45* 1.58*  LATICACIDVEN  --  3.80* 1.67  --   --   --     Estimated Creatinine Clearance: 33.2 mL/min (A) (by C-G formula based on SCr of 1.58 mg/dL (H)).    No Known Allergies  Thank you for allowing pharmacy to be a part of this patient's care.   ,Adrian Saran, PharmD, BCPS Pager [phone] 09/01/2017 8:14 AM

## 2017-09-02 ENCOUNTER — Ambulatory Visit: Payer: Medicare Other

## 2017-09-02 ENCOUNTER — Ambulatory Visit
Admission: RE | Admit: 2017-09-02 | Discharge: 2017-09-02 | Disposition: A | Discharge status: Home / Self Care | Payer: Medicare Other | Source: Ambulatory Visit | Attending: Radiation Oncology | Admitting: Radiation Oncology

## 2017-09-02 LAB — CBC
HEMATOCRIT: 41.5 % (ref 39.0–52.0)
HEMOGLOBIN: 13.5 g/dL (ref 13.0–17.0)
MCH: 28.6 pg (ref 26.0–34.0)
MCHC: 32.5 g/dL (ref 30.0–36.0)
MCV: 87.9 fL (ref 78.0–100.0)
Platelets: 239 10*3/uL (ref 150–400)
RBC: 4.72 MIL/uL (ref 4.22–5.81)
RDW: 14.2 % (ref 11.5–15.5)
WBC: 14.4 10*3/uL — ABNORMAL HIGH (ref 4.0–10.5)

## 2017-09-02 LAB — POTASSIUM
POTASSIUM: 2.8 mmol/L — AB (ref 3.5–5.1)
Potassium: 4.6 mmol/L (ref 3.5–5.1)

## 2017-09-02 LAB — GLUCOSE, CAPILLARY
GLUCOSE-CAPILLARY: 143 mg/dL — AB (ref 65–99)
GLUCOSE-CAPILLARY: 107 mg/dL — AB (ref 65–99)
GLUCOSE-CAPILLARY: 102 mg/dL — AB (ref 65–99)
Glucose-Capillary: 134 mg/dL — ABNORMAL HIGH (ref 65–99)
Glucose-Capillary: 179 mg/dL — ABNORMAL HIGH (ref 65–99)
Glucose-Capillary: 187 mg/dL — ABNORMAL HIGH (ref 65–99)

## 2017-09-02 LAB — BASIC METABOLIC PANEL
ANION GAP: 17 — AB (ref 5–15)
BUN: 28 mg/dL — ABNORMAL HIGH (ref 6–20)
CHLORIDE: 95 mmol/L — AB (ref 101–111)
CO2: 27 mmol/L (ref 22–32)
CREATININE: 1.79 mg/dL — AB (ref 0.61–1.24)
Calcium: 8.2 mg/dL — ABNORMAL LOW (ref 8.9–10.3)
GFR calc non Af Amer: 32 mL/min — ABNORMAL LOW (ref >60)
GFR, EST AFRICAN AMERICAN: 37 mL/min — AB (ref >60)
Glucose, Bld: 152 mg/dL — ABNORMAL HIGH (ref 65–99)
Potassium: 2.7 mmol/L — CL (ref 3.5–5.1)
Sodium: 139 mmol/L (ref 135–145)

## 2017-09-02 LAB — MAGNESIUM: MAGNESIUM: 2.1 mg/dL (ref 1.7–2.4)

## 2017-09-02 MED ORDER — OXYCODONE HCL 5 MG PO TABS: 2.5000 mg | ORAL_TABLET | Freq: Four times a day (QID) | ORAL | 0 refills | Status: DC | PRN

## 2017-09-02 MED ORDER — POTASSIUM CHLORIDE CRYS ER 20 MEQ PO TBCR
40.0000 meq | EXTENDED_RELEASE_TABLET | Freq: Once | ORAL | Status: AC
  Administered 2017-09-02: 40 meq via ORAL
  Filled 2017-09-02 (×2): qty 2

## 2017-09-02 MED ORDER — HYDROCORTISONE ACETATE 25 MG RE SUPP: 25.0000 mg | Freq: Two times a day (BID) | RECTAL | 0 refills | Status: DC

## 2017-09-02 MED ORDER — ENSURE NUTRITION SHAKE PO LIQD: 1.0000 | Freq: Three times a day (TID) | ORAL | Status: DC

## 2017-09-02 MED ORDER — POTASSIUM CHLORIDE CRYS ER 20 MEQ PO TBCR
40.0000 meq | EXTENDED_RELEASE_TABLET | ORAL | Status: AC
  Administered 2017-09-02 (×2): 40 meq via ORAL
  Filled 2017-09-02 (×2): qty 2

## 2017-09-02 MED ORDER — POTASSIUM CHLORIDE CRYS ER 20 MEQ PO TBCR: 40.0000 meq | EXTENDED_RELEASE_TABLET | ORAL | Status: DC

## 2017-09-02 MED ORDER — POTASSIUM CHLORIDE CRYS ER 20 MEQ PO TBCR: EXTENDED_RELEASE_TABLET | ORAL | Status: DC

## 2017-09-02 MED ORDER — AMLODIPINE BESYLATE 5 MG PO TABS: 5.0000 mg | ORAL_TABLET | Freq: Every day | ORAL | Status: DC

## 2017-09-02 MED ORDER — AMOXICILLIN-POT CLAVULANATE 875-125 MG PO TABS: 1.0000 | ORAL_TABLET | Freq: Two times a day (BID) | ORAL | Status: DC

## 2017-09-02 MED ORDER — ENSURE ENLIVE PO LIQD
237.0000 mL | Freq: Two times a day (BID) | ORAL | Status: DC
  Administered 2017-09-02 – 2017-09-03 (×2): 237 mL via ORAL

## 2017-09-02 NOTE — NC FL2 (Signed)
[place] LEVEL OF CARE SCREENING TOOL     IDENTIFICATION  Patient Name: [name] Cook Birthdate: 1927/04/08 Sex: male Admission Date (Current Location): 08/29/2017  [facility] and [place] Number:  Herbalist and Address:  [facility],  [place] [address] [place]      Provider Number: [id number]  Attending Physician Name and Address:  Bonnielee Haff, MD  Relative Name and Phone Number:       Current Level of Care: Hospital Recommended Level of Care: [place] Prior Approval Number:    Date Approved/Denied:   PASRR Number: [id number]  Discharge Plan: SNF    Current Diagnoses: Patient Active Problem List   Diagnosis Date Noted  . Sepsis ([place]) 08/29/2017  . Cancer of bladder ([place]) 08/19/2017  . Bladder disease 07/19/2017  . Compression fracture of L2 ([place]) 05/24/2017  . Rhabdomyolysis 05/24/2017  . Mass of urinary bladder determined by ultrasound 05/24/2017  . Dementia 05/24/2017  . Compression fracture of body of thoracic vertebra ([place]) 05/17/2017  . Diabetes mellitus without complication ([place])   . Coronary artery disease   . Hypertension     Orientation RESPIRATION BLADDER Height & Weight     Self, Place  Normal Incontinent Weight: 160 lb (72.6 kg) Height:  6\' 1"  (185.4 cm)  BEHAVIORAL SYMPTOMS/MOOD NEUROLOGICAL BOWEL NUTRITION STATUS      Incontinent Diet(regular diet, thin fluids)  AMBULATORY STATUS COMMUNICATION OF NEEDS Skin   Extensive Assist   Normal                       Personal Care Assistance Level of Assistance  Bathing, Feeding, Dressing Bathing Assistance: Limited assistance Feeding assistance: Independent Dressing Assistance: Limited assistance     Functional Limitations Info  Sight, Hearing, Speech Sight Info: Adequate Hearing Info: Impaired(hard of hearing) Speech Info: Adequate    SPECIAL CARE FACTORS FREQUENCY  PT (By licensed PT), OT (By licensed OT)     PT  Frequency: 5x OT Frequency: 5x            Contractures Contractures Info: Not present    Additional Factors Info  Code Status, Allergies Code Status Info: full code Allergies Info: nka           Current Medications (09/02/2017):  This is the current hospital active medication list Current Facility-Administered Medications  Medication Dose Route Frequency Provider Last Rate Last Dose  . 0.9 %  sodium chloride infusion   Intravenous Continuous Bonnielee Haff, MD 50 mL/hr at 09/01/17 1657    . acetaminophen (TYLENOL) tablet 650 mg  650 mg Oral Q6H PRN Bennie Pierini, MD   650 mg at 09/01/17 1705   Or  . acetaminophen (TYLENOL) suppository 650 mg  650 mg Rectal Q6H PRN Bennie Pierini, MD      . amLODipine [facility]) tablet 5 mg  5 mg Oral Daily Irene Pap N, DO   5 mg at 09/01/17 4174  . amoxicillin-clavulanate (AUGMENTIN) 875-125 MG per tablet 1 tablet  1 tablet Oral Q12H Bonnielee Haff, MD   1 tablet at 09/01/17 2126  . docusate sodium (COLACE) capsule 100 mg  100 mg Oral BID Bennie Pierini, MD   100 mg at 09/01/17 2126  . heparin injection 5,000 Units  5,000 Units Subcutaneous Q8H Bennie Pierini, MD   5,000 Units at 09/02/17 0636  . hydrocortisone (ANUSOL-HC) suppository 25 mg  25 mg Rectal BID Bonnielee Haff, MD   25  mg at 09/01/17 2330  . insulin aspart (novoLOG) injection 0-9 Units  0-9 Units Subcutaneous Q4H Bennie Pierini, MD   1 Units at 09/02/17 0413  . metoprolol tartrate (LOPRESSOR) tablet 50 mg  50 mg Oral BID Bennie Pierini, MD   50 mg at 09/01/17 2126  . ondansetron (ZOFRAN) tablet 4 mg  4 mg Oral Q6H PRN Bennie Pierini, MD       Or  . ondansetron [facility]) injection 4 mg  4 mg Intravenous Q6H PRN Bennie Pierini, MD      . oxyCODONE (Oxy IR/ROXICODONE) immediate release tablet 2.5 mg  2.5 mg Oral Q6H PRN Bennie Pierini, MD   2.5 mg at 09/01/17 0834  . polyethylene glycol (MIRALAX / GLYCOLAX) packet 17 g  17 g Oral Daily Bennie Pierini, MD   17 g at 09/01/17 1000  . potassium chloride SA (K-DUR,KLOR-CON) CR tablet 40 mEq  40 mEq Oral Q4H Bonnielee Haff, MD   40 mEq at 09/02/17 0852  . rosuvastatin (CRESTOR) tablet 20 mg  20 mg Oral Daily Bennie Pierini, MD   20 mg at 09/01/17 7711  . senna (SENOKOT) tablet 8.6 mg  1 tablet Oral QHS Bennie Pierini, MD   8.6 mg at 09/01/17 2127     Discharge Medications: Please see discharge summary for a list of discharge medications.  Relevant Imaging Results:  Relevant Lab Results:   Additional Information [phone]  Nila Nephew, LCSW

## 2017-09-02 NOTE — Progress Notes (Signed)
[facility] Therapy Treatment Record Phone [phone]   Radiation Therapy was administered to Grayland Jack on: 09/02/2017  10:59 AM and was treatment # 7 out of a planned course of 10 treatments.  Radiation Treatment  1). Beam Photons 10-19 MeV  2). Brachytherapy None  3). Stereotactic Radiosurgery None  4). Other Radiation None     Myria Steenbergen, Derk Doubek, RT (T)

## 2017-09-02 NOTE — Progress Notes (Signed)
[facility] PROGRESS NOTE  AYDIEN MAJETTE JYN:[id number] DOB: 1927/02/26 DOA: 08/29/2017  PCP: System, Pcp Not In  Brief History/Interval Summary: Mr. Thorington is an 84 yo male with PMH significant for recently diagnosed metastasized urothelial carcinoma (07/19/17) s/p TURBT on palliative radiation therapy, DM2, HTN, CAD s/p PCI with stents, dementia, chronic left hydronephrosis who presented to ED from SNF with altered mentation. Admitted for acute metabolic encephalopathy in the setting of sepsis 2/2 pyelonephritis. Code sepsis initiated in the ED with IV fluid and IV antibiotics.  Urine cultures positive for MSSA.  Most likely due to recent instrumentation.  Reason for Visit: Acute pyelonephritis  Consultants: Radiation oncology  Procedures: Radiation treatments  Antibiotics: Zosyn changed over to Augmentin  Subjective/Interval History: Patient remains pleasantly confused.  Continues to complain of rectal pain.  No nausea vomiting.    ROS: Denies any abdominal pain  Objective:  Vital Signs  Vitals:   09/01/17 2100 09/01/17 2216 09/02/17 0515 09/02/17 1322  BP: (!) 145/83  (!) 169/70 (!) 169/72  Pulse: 68  (!) 56 60  Resp: 16  20 20   Temp: (!) 97.4 F (36.3 C)  97.6 F (36.4 C) (!) 97.5 F (36.4 C)  TempSrc: Oral Oral Oral Oral  SpO2: 95%  96% 95%  Weight:      Height:        Intake/Output Summary (Last 24 hours) at 09/02/2017 1357 Last data filed at 09/02/2017 1030 Gross per 24 hour  Intake 470 ml  Output 1000 ml  Net -530 ml   Filed Weights   08/29/17 1639  Weight: 72.6 kg (160 lb)    General appearance: Awake alert.  In no distress Resp: Clear to auscultation bilaterally Cardio: S1-S2 is normal regular.  No S3-S4.  No rubs murmurs or bruit GI: Abdomen soft.  Nontender nondistended.  Bowel sounds are present.  No masses organomegaly Extremities: No edema Neurologic: Pleasantly confused.  No focal deficits.  Lab Results:  Data Reviewed: I have  personally reviewed following labs and imaging studies  CBC: Recent Labs  Lab 08/29/17 1653 08/30/17 0409 08/31/17 0421 09/02/17 0339  WBC 14.4* 12.8* 9.3 14.4*  NEUTROABS 13.0*  --   --   --   HGB 12.3* 12.0* 11.8* 13.5  HCT 37.6* 36.5* 36.3* 41.5  MCV 88.1 87.1 87.7 87.9  PLT 369 331 313 865    Basic Metabolic Panel: Recent Labs  Lab 08/29/17 1653 08/30/17 0409 08/31/17 0421 09/01/17 0354 09/02/17 0339 09/02/17 1247  NA 132* 133* 136  --  139  --   K 4.8 2.8* 3.3*  --  2.7* 2.8*  CL 87* 91* 97*  --  95*  --   CO2 30 29 24   --  27  --   GLUCOSE 151* 76 84  --  152*  --   BUN 30* 28* 28*  --  28*  --   CREATININE 1.77* 1.55* 1.57*  1.45* 1.58* 1.79*  --   CALCIUM 8.8* 8.3* 7.9*  --  8.2*  --   MG 2.1 2.0 1.9 2.4  --   --   PHOS 3.9 3.1 2.6 2.5  --   --     GFR: Estimated Creatinine Clearance: 29.3 mL/min (A) (by C-G formula based on SCr of 1.79 mg/dL (H)).  Liver Function Tests: Recent Labs  Lab 08/29/17 1653 08/30/17 0409  AST 72* 61*  ALT 53 49  ALKPHOS 455* 400*  BILITOT 1.3* 1.2  PROT 8.1 7.8  ALBUMIN 2.8*  2.6*     Recent Labs  Lab 08/30/17 0024  AMMONIA 22    CBG: Recent Labs  Lab 09/01/17 2004 09/02/17 0011 09/02/17 0351 09/02/17 0751 09/02/17 1133  GLUCAP 116* 134* 143* 107* 179*     Recent Results (from the past 240 hour(s))  Blood Culture (routine x 2)     Status: None (Preliminary result)   Collection Time: 08/29/17  4:53 PM  Result Value Ref Range Status   Specimen Description   Final    RIGHT ANTECUBITAL Performed at [facility], [place] [address], [place], [place] [address]    Special Requests   Final    BOTTLES DRAWN AEROBIC AND ANAEROBIC Blood Culture adequate volume Performed at [place] [address] [place], [place] [address]    Culture   Final    NO GROWTH 3 DAYS Performed at [facility], [place] [address] [place], [place] [address]    Report Status PENDING   Incomplete  Urine culture     Status: Abnormal   Collection Time: 08/29/17  4:53 PM  Result Value Ref Range Status   Specimen Description   Final    URINE, CLEAN CATCH Performed at [facility], [place] [address], [place], [place] [address]    Special Requests   Final    NONE Performed at [facility], [place] [address], [place], [place] [address]    Culture >=100,000 COLONIES/mL STAPHYLOCOCCUS AUREUS (A)  Final   Report Status 09/01/2017 FINAL  Final   Organism ID, Bacteria STAPHYLOCOCCUS AUREUS (A)  Final      Susceptibility   Staphylococcus aureus - MIC*    CIPROFLOXACIN <=0.5 SENSITIVE Sensitive     GENTAMICIN <=0.5 SENSITIVE Sensitive     NITROFURANTOIN <=16 SENSITIVE Sensitive     OXACILLIN 0.5 SENSITIVE Sensitive     TETRACYCLINE <=1 SENSITIVE Sensitive     VANCOMYCIN <=0.5 SENSITIVE Sensitive     TRIMETH/SULFA <=10 SENSITIVE Sensitive     CLINDAMYCIN <=0.25 SENSITIVE Sensitive     RIFAMPIN <=0.5 SENSITIVE Sensitive     Inducible Clindamycin NEGATIVE Sensitive     * >=100,000 COLONIES/mL STAPHYLOCOCCUS AUREUS  Blood Culture (routine x 2)     Status: None (Preliminary result)   Collection Time: 08/29/17  4:58 PM  Result Value Ref Range Status   Specimen Description   Final    LEFT ANTECUBITAL Performed at [place] [address], [place], [place] [address]    Special Requests   Final    BOTTLES DRAWN AEROBIC AND ANAEROBIC Blood Culture adequate volume Performed at [place] [address] [place], [place] [address]    Culture   Final    NO GROWTH 3 DAYS Performed at [facility], [place] [address], [place], [place] [address]    Report Status PENDING  Incomplete  MRSA PCR Screening     Status: None   Collection Time: 08/30/17 12:47 AM  Result Value Ref Range Status   MRSA by PCR NEGATIVE NEGATIVE Final    Comment:        The GeneXpert MRSA Assay (FDA approved for NASAL  specimens only), is one component of a comprehensive MRSA colonization surveillance program. It is not intended to diagnose MRSA infection nor to guide or monitor treatment for MRSA infections. Performed at [facility], [place] [address], [place], [place] [address]       Radiology Studies: No results found.  Medications:  Scheduled: . amLODipine  5 mg Oral Daily  . amoxicillin-clavulanate  1 tablet Oral Q12H  . docusate sodium  100 mg Oral BID  . feeding supplement (ENSURE ENLIVE)  237 mL Oral BID BM  . heparin  5,000 Units Subcutaneous Q8H  . hydrocortisone  25 mg Rectal BID  . insulin aspart  0-9 Units Subcutaneous Q4H  . metoprolol tartrate  50 mg Oral BID  . polyethylene glycol  17 g Oral Daily  . potassium chloride  40 mEq Oral Once  . rosuvastatin  20 mg Oral Daily  . senna  1 tablet Oral QHS   Continuous: . sodium chloride 50 mL/hr at 09/01/17 1657   VOJ:[id number] **OR** acetaminophen, ondansetron **OR** ondansetron (ZOFRAN) IV, oxyCODONE  Assessment/Plan:  Active Problems:   Sepsis ([place])      Sepsis 2/2 acute pyelonephritis Patient has been afebrile.  WBC is normal.  Sepsis physiology resolved.  Urine culture positive for MSSA.  Most likely secondary to recent instrumentation.  Blood cultures have been negative.  Patient was initially placed on IV Zosyn.  He was changed over to Augmentin which will be continued for now.  Mild elevation in WBC is noted this morning although he is afebrile.  Continue to monitor for now.  Hypokalemia Dressing noted to be significantly low today.  This was aggressively repleted.  On the repeat value was checked which was also low.  Continue to aggressively replete.  Hyponatremia, hypovolemic This was mild.  Resolved.    AKI -baseline cr 0.77 Creatinine remains elevated although he has good urine output.  Give him additional IV fluids for the next 12 hours.  Elevated AST, alk phosp Etiology  unclear but levels are stable.  Outpatient monitoring.  Recently diagnosed high grade invasive urothelilal carcinoma s/p TURBT on palliative radiation therapy -Receiving radiation treatment.  Rectal pain probably due to effects of radiation.  Use steroid suppository for rectal pain.  Hyperlipidemia -continue crestor  Dementia with mild disturbances -reorient as needed -Fall precaution  Chronic left hydronephrosis -continue to avoid nephrotoxic agents -monitor urine output Follow-up with urology.   DVT Prophylaxis: Subutaneous heparin    Code Status: Full code Family Communication: No family at bedside Disposition Plan: We will need to replete his potassium prior to discharge.  Hopefully discharge to skilled nursing facility tomorrow.    LOS: 4 days   [facility] Pager [phone] 09/02/2017, 1:57 PM  If 7PM-7AM, please contact night-coverage at [email or web address], password [facility]

## 2017-09-02 NOTE — Progress Notes (Signed)
Physical Therapy Treatment Patient Details Name: [name] Cook MRN: [id number] DOB: [birth year] Today's Date: 09/02/2017    History of Present Illness [name] is an 84 yo male with PMH significant for recently diagnosed metastasized urothelial carcinoma (07/19/17) s/p TURBT on palliative radiation therapy, DM2, HTN, CAD s/p PCI with stents, dementia, chronic left hydronephrosis who presented to ED from SNF with altered mentation. Admitted for acute metabolic encephalopathy in the setting of sepsis 2/2 pyelonephritis    PT Comments    Pt alert and oriented x 3 but slow to respond.  Assisted from supine to EOB Mod Assist with increased time and use of bed pad to complete scooting to EOB.  Once upright, pt able to static sit x 10.  No c/o's other that mild ABD pain.  Assisted with standing pt tends to pull up from walker vs push from bed.  Required increased time and x 3 attempts to compete.  Slow and rigid mvts.  Pt was too weak to attempt amb so assisted back to bed per pt request.   Pt plans to return to [facility].   Follow Up Recommendations  SNF     Equipment Recommendations  None recommended by PT    Recommendations for Other Services       Precautions / Restrictions Precautions Precautions: Fall    Mobility  Bed Mobility Overal bed mobility: Needs Assistance Bed Mobility: Supine to Sit;Sit to Supine     Supine to sit: Mod assist Sit to supine: Mod assist   General bed mobility comments: assist with legs and trunk  once upright, pt able to self maintain at Supervision level.   Transfers Overall transfer level: Needs assistance Equipment used: Rolling walker (2 wheeled) Transfers: Sit to/from Stand Sit to Stand: Mod assist;Max assist         General transfer comment: pt tends to pull self up from walker vs push from bed with increased time.  Slow and rigid throughout.  Ambulation/Gait             General Gait Details: pt unable to amb due to weakness.   brief standing EOB required Mod Assist.    Stairs            Wheelchair Mobility    Modified Rankin (Stroke Patients Only)       Balance                                            Cognition Arousal/Alertness: Awake/alert Behavior During Therapy: WFL for tasks assessed/performed Overall Cognitive Status: History of cognitive impairments - at baseline                                 General Comments: slow to respond and express but alert and oriented      Exercises      General Comments        Pertinent Vitals/Pain Pain Assessment: Faces Faces Pain Scale: Hurts a little bit Pain Location: all over Pain Descriptors / Indicators: Discomfort;Grimacing Pain Intervention(s): Monitored during session;Repositioned    Home Living                      Prior Function            PT Goals (current goals can now be found  in the care plan section) Progress towards PT goals: Progressing toward goals    Frequency    Min 2X/week      PT Plan      Co-evaluation              AM-PAC PT "6 Clicks" Daily Activity  Outcome Measure  Difficulty turning over in bed (including adjusting bedclothes, sheets and blankets)?: Unable Difficulty moving from lying on back to sitting on the side of the bed? : Unable Difficulty sitting down on and standing up from a chair with arms (e.g., wheelchair, bedside commode, etc,.)?: Unable Help needed moving to and from a bed to chair (including a wheelchair)?: Total Help needed walking in hospital room?: Total Help needed climbing 3-5 steps with a railing? : Total 6 Click Score: 6    End of Session Equipment Utilized During Treatment: Gait belt Activity Tolerance: Patient tolerated treatment well Patient left: in bed;with call bell/phone within reach;with bed alarm set;with nursing/sitter in room Nurse Communication: Mobility status PT Visit Diagnosis: Unsteadiness on feet (R26.81)      Time: 5277-8242 PT Time Calculation (min) (ACUTE ONLY): 18 min  Charges:  $Therapeutic Activity: 8-22 mins                    G Codes:       Rica Koyanagi  PTA [facility]  Acute  Rehab Pager      [phone]

## 2017-09-02 NOTE — Consult Note (Signed)
   [facility] CM Inpatient Consult   09/02/2017  ANTWIAN SANTAANA [birth year] [id number]   Patient screened for [place] Management program services due to multiple hospitalizations.  Spoke with inpatient RNCM. Discharge plan is to return to [facility]. Chart reviewed and noted no Primary Care MD is listed.  No identifiable [facility] Care Management needs.  Marthenia Rolling, MSN-Ed, RN,BSN [facility] Liaison [phone]

## 2017-09-03 ENCOUNTER — Ambulatory Visit
Admission: RE | Admit: 2017-09-03 | Discharge: 2017-09-03 | Disposition: A | Discharge status: Home / Self Care | Payer: Medicare Other | Source: Ambulatory Visit | Attending: Radiation Oncology | Admitting: Radiation Oncology

## 2017-09-03 ENCOUNTER — Ambulatory Visit: Payer: Medicare Other

## 2017-09-03 LAB — BASIC METABOLIC PANEL
ANION GAP: 13 (ref 5–15)
BUN: 28 mg/dL — ABNORMAL HIGH (ref 6–20)
CALCIUM: 7.9 mg/dL — AB (ref 8.9–10.3)
CO2: 26 mmol/L (ref 22–32)
CREATININE: 1.53 mg/dL — AB (ref 0.61–1.24)
Chloride: 100 mmol/L — ABNORMAL LOW (ref 101–111)
GFR, EST AFRICAN AMERICAN: 45 mL/min — AB (ref >60)
GFR, EST NON AFRICAN AMERICAN: 39 mL/min — AB (ref >60)
Glucose, Bld: 165 mg/dL — ABNORMAL HIGH (ref 65–99)
Potassium: 4 mmol/L (ref 3.5–5.1)
Sodium: 139 mmol/L (ref 135–145)

## 2017-09-03 LAB — GLUCOSE, CAPILLARY
GLUCOSE-CAPILLARY: 156 mg/dL — AB (ref 65–99)
GLUCOSE-CAPILLARY: 76 mg/dL (ref 65–99)
Glucose-Capillary: 173 mg/dL — ABNORMAL HIGH (ref 65–99)
Glucose-Capillary: 154 mg/dL — ABNORMAL HIGH (ref 65–99)

## 2017-09-03 LAB — MAGNESIUM: MAGNESIUM: 2 mg/dL (ref 1.7–2.4)

## 2017-09-03 MED ORDER — POTASSIUM CHLORIDE CRYS ER 20 MEQ PO TBCR: 20.0000 meq | EXTENDED_RELEASE_TABLET | Freq: Every day | ORAL | Status: DC

## 2017-09-03 MED ORDER — SACCHAROMYCES BOULARDII 250 MG PO CAPS: 250.0000 mg | ORAL_CAPSULE | Freq: Two times a day (BID) | ORAL | Status: DC

## 2017-09-03 MED ORDER — AMOXICILLIN-POT CLAVULANATE 875-125 MG PO TABS: 1.0000 | ORAL_TABLET | Freq: Two times a day (BID) | ORAL | 0 refills | Status: DC

## 2017-09-03 NOTE — Progress Notes (Addendum)
Patient medically ready to discharge back to SNF[facility] D/C summary sent. Med. Nes. Complete Nurse given number to call report. Rm. [phone] [facility] to transport after lunch. [facility] arranged for transport.  Patient daughter notified of transport.   Kathrin Greathouse, Latanya Presser, MSW Clinical Social Worker  [phone] 09/03/2017  12:21 PM

## 2017-09-03 NOTE — Discharge Summary (Signed)
[facility]  Physician Discharge Summary   Patient ID: [name] Cook MRN: [id number] DOB/AGE: Jan 01, 1927 84 y.o.  Admit date: 08/29/2017 Discharge date: 09/03/2017  PCP: System, Pcp Not In  DISCHARGE DIAGNOSES:  Active Problems:   Sepsis ([place])   RECOMMENDATIONS FOR OUTPATIENT FOLLOW UP: 1. CBC and complete metabolic panel on Monday 2. Continue radiation treatments as before.   DISCHARGE CONDITION: fair  Diet recommendation: Dysphagia 3 diet with thin liquids.  Aspiration precautions.  Filed Weights   08/29/17 1639  Weight: 72.6 kg (160 lb)    INITIAL HISTORY: Mr. [name] Cook is an 84 yo male with PMH significant for recently diagnosed metastasized urothelial carcinoma (07/19/17) s/p TURBT on palliative radiation therapy, DM2, HTN, CAD s/p PCI with stents, dementia, chronic left hydronephrosis who presented to ED from SNF with altered mentation. Admitted for acute metabolic encephalopathy in the setting of sepsis 2/2 pyelonephritis. Code sepsis initiated in the ED with IV fluid and IV antibiotics.  Urine cultures positive for MSSA.  Most likely due to recent instrumentation.  Consultants: Radiation oncology  Procedures: Radiation treatments   HOSPITAL COURSE:   Sepsis 2/2 acute pyelonephritis Patient was initially placed on intravenous Zosyn.  Urine cultures positive for MSSA.  This is likely due to recent instrumentation for his bladder cancer.  Patient was switched over to Augmentin.  He remains afebrile.  Mild elevation in WBC noted.  Clinically he has improved.  We will continue Augmentin for 62moe days.    Hypokalemia Potassium was noted to be low during this hospitalization.  He was aggressively repleted.  Magnesium was noted to be normal.  Finally potassium levels are normal this morning.  He will be discharged on low-dose potassium on a daily basis.    Hyponatremia, hypovolemic This was mild.  Resolved.    AKI Baseline cr 0.77. Creatinine remains  elevated although he has good urine output.    Creatinine improved to 1.53 today.  Recommend repeating his renal function panel next week..  Elevated AST, alk phosp Etiology unclear but levels are stable.  Outpatient monitoring.  Recently diagnosed high grade invasive urothelilal carcinoma s/p TURBT on palliative radiation therapy Receiving radiation treatment.  Rectal pain probably due to effects of radiation.  Use steroid suppository for rectal pain.  Hyperlipidemia Continue crestor.  Consider discontinuing if he has significant elevation in his LFTs.  Dementia with mild disturbances Has been stable during this hospitalization.  No significant behavioral issues.  Pleasantly confused.  Chronic left hydronephrosis Follow-up with urology.  Overall stable.  Okay for discharge back to skilled nursing facility today.    PERTINENT LABS:  The results of significant diagnostics from this hospitalization (including imaging, microbiology, ancillary and laboratory) are listed below for reference.    Microbiology: Recent Results (from the past 240 hour(s))  Blood Culture (routine x 2)     Status: None (Preliminary result)   Collection Time: 08/29/17  4:53 PM  Result Value Ref Range Status   Specimen Description   Final    RIGHT ANTECUBITAL Performed at [facility] [place] [address]   Special Requests   Final    BOTTLES DRAWN AEROBIC AND ANAEROBIC Blood Culture adequate volume Performed at [facility] [place] [address]   Culture   Final    NO GROWTH 3 DAYS Performed at [facility] [address] [place] [address]   Report Status PENDING  Incomplete  Urine  culture     Status: Abnormal   Collection Time: 08/29/17  4:53 PM  Result Value Ref Range Status   Specimen Description   Final    URINE, CLEAN CATCH Performed at [facility], [place] [address], [place], [place] [address]    Special Requests   Final    NONE Performed at [facility], [place] [address], [place], [place] [address]    Culture >=100,000 COLONIES/mL STAPHYLOCOCCUS AUREUS (A)  Final   Report Status 09/01/2017 FINAL  Final   Organism ID, Bacteria STAPHYLOCOCCUS AUREUS (A)  Final      Susceptibility   Staphylococcus aureus - MIC*    CIPROFLOXACIN <=0.5 SENSITIVE Sensitive     GENTAMICIN <=0.5 SENSITIVE Sensitive     NITROFURANTOIN <=16 SENSITIVE Sensitive     OXACILLIN 0.5 SENSITIVE Sensitive     TETRACYCLINE <=1 SENSITIVE Sensitive     VANCOMYCIN <=0.5 SENSITIVE Sensitive     TRIMETH/SULFA <=10 SENSITIVE Sensitive     CLINDAMYCIN <=0.25 SENSITIVE Sensitive     RIFAMPIN <=0.5 SENSITIVE Sensitive     Inducible Clindamycin NEGATIVE Sensitive     * >=100,000 COLONIES/mL STAPHYLOCOCCUS AUREUS  Blood Culture (routine x 2)     Status: None (Preliminary result)   Collection Time: 08/29/17  4:58 PM  Result Value Ref Range Status   Specimen Description   Final    LEFT ANTECUBITAL Performed at [place] [address] [place], [place] [address]    Special Requests   Final    BOTTLES DRAWN AEROBIC AND ANAEROBIC Blood Culture adequate volume Performed at [place] [address], [place], [place] [address]    Culture   Final    NO GROWTH 3 DAYS Performed at [facility], [place] [address], [place], [place] [address]    Report Status PENDING  Incomplete  MRSA PCR Screening     Status: None   Collection Time: 08/30/17 12:47 AM  Result Value Ref Range Status   MRSA by PCR NEGATIVE NEGATIVE Final    Comment:        The GeneXpert MRSA Assay (FDA approved for NASAL specimens only), is one component of a comprehensive MRSA colonization surveillance program. It is not intended to diagnose MRSA infection nor to guide or monitor treatment for MRSA infections. Performed at [facility],  [place] [address], [place], [place] [address]      Labs: Basic Metabolic Panel: Recent Labs  Lab 08/29/17 1653 08/30/17 0409 08/31/17 0421 09/01/17 0354 09/02/17 0339 09/02/17 1247 09/02/17 1802 09/03/17 0354  NA 132* 133* 136  --  139  --   --  139  K 4.8 2.8* 3.3*  --  2.7* 2.8* 4.6 4.0  CL 87* 91* 97*  --  95*  --   --  100*  CO2 30 29 24   --  27  --   --  26  GLUCOSE 151* 76 84  --  152*  --   --  165*  BUN 30* 28* 28*  --  28*  --   --  28*  CREATININE 1.77* 1.55* 1.57*  1.45* 1.58* 1.79*  --   --  1.53*  CALCIUM 8.8* 8.3* 7.9*  --  8.2*  --   --  7.9*  MG 2.1 2.0 1.9 2.4  --  2.1  --  2.0  PHOS 3.9 3.1 2.6 2.5  --   --   --   --  Liver Function Tests: Recent Labs  Lab 08/29/17 1653 08/30/17 0409  AST 72* 61*  ALT 53 49  ALKPHOS 455* 400*  BILITOT 1.3* 1.2  PROT 8.1 7.8  ALBUMIN 2.8* 2.6*    Recent Labs  Lab 08/30/17 0024  AMMONIA 22   CBC: Recent Labs  Lab 08/29/17 1653 08/30/17 0409 08/31/17 0421 09/02/17 0339  WBC 14.4* 12.8* 9.3 14.4*  NEUTROABS 13.0*  --   --   --   HGB 12.3* 12.0* 11.8* 13.5  HCT 37.6* 36.5* 36.3* 41.5  MCV 88.1 87.1 87.7 87.9  PLT 369 331 313 239   CBG: Recent Labs  Lab 09/02/17 1713 09/02/17 2041 09/03/17 0034 09/03/17 0429 09/03/17 0745  GLUCAP 187* 102* 156* 173* 76     IMAGING STUDIES Ct Abdomen Pelvis Wo Contrast  Result Date: 08/29/2017 CLINICAL DATA:  Nausea, vomiting. Current history of bladder cancer. EXAM: CT ABDOMEN AND PELVIS WITHOUT CONTRAST TECHNIQUE: Multidetector CT imaging of the abdomen and pelvis was performed following the standard protocol without IV contrast. COMPARISON:  CT scan of August 09, 2017. FINDINGS: Lower chest: Increased bibasilar ill-defined densities are noted concerning for atelectasis or possibly inflammation. Hepatobiliary: Right hepatic low density is again noted concerning for metastatic disease. Status post cholecystectomy. No biliary dilatation. Pancreas: Unremarkable. No  pancreatic ductal dilatation or surrounding inflammatory changes. Spleen: Normal in size without focal abnormality. Adrenals/Urinary Tract: 3.7 cm left adrenal mass is noted concerning for possible metastatic lesion. Right adrenal gland appears normal. Stable small hyperdense cyst is seen in right kidney. Stable moderate left hydroureteronephrosis is noted without obstructing calculus. This most likely is due to obstruction due to tumor in the bladder. Large mass is seen involving the anterior and left side of the urinary bladder, consistent with history of bladder carcinoma. Stomach/Bowel: The stomach appears normal. There is no evidence of bowel obstruction or inflammation. Status post appendectomy. Sigmoid diverticulosis is noted without inflammation. Vascular/Lymphatic: Aortic atherosclerosis. No enlarged abdominal or pelvic lymph nodes. Reproductive: Crescent-shaped fluid collection is seen along the left side of the prostate gland which most likely represents postsurgical change consistent with history of trans urethral resection. Other: No abdominal wall hernia or abnormality. No abdominopelvic ascites. Musculoskeletal: No acute or significant osseous findings. IMPRESSION: Increased bibasilar ill-defined densities are noted concerning for atelectasis or possibly inflammation. Right hepatic low density is noted concerning for metastatic disease. 3.7 cm left adrenal mass is noted concerning for metastatic disease. Stable moderate left hydroureteronephrosis is noted without obstructing calculus. This most likely is due to obstruction due to large bladder tumor, which is unchanged compared to prior exam and consistent with history of bladder carcinoma. Sigmoid diverticulosis is noted without inflammation. Aortic Atherosclerosis (ICD10-I70.0). Electronically Signed   By: Marijo Conception, M.D.   On: 08/29/2017 19:14   Ct Head Wo Contrast  Result Date: 08/29/2017 CLINICAL DATA:  84 year old male with history of  altered mental status. EXAM: CT HEAD WITHOUT CONTRAST TECHNIQUE: Contiguous axial images were obtained from the base of the skull through the vertex without intravenous contrast. COMPARISON:  Head CT 08/05/2012. FINDINGS: Brain: Moderate cerebral and mild cerebellar atrophy. Patchy and confluent areas of decreased attenuation are noted throughout the deep and periventricular white matter of the cerebral hemispheres bilaterally, compatible with chronic microvascular ischemic disease.No evidence of acute infarction, hemorrhage, hydrocephalus, extra-axial collection or mass lesion/mass effect. Vascular: No hyperdense vessel or unexpected calcification. Skull: Normal. Negative for fracture or focal lesion. Sinuses/Orbits: No acute finding. Other: None. IMPRESSION: 1. No acute intracranial  abnormalities. 2. Moderate cerebral and mild cerebellar atrophy with chronic microvascular ischemic changes in the cerebral white matter, similar to the prior study, as above. Electronically Signed   By: Vinnie Langton M.D.   On: 08/29/2017 18:58   US Renal  Result Date: 08/30/2017 CLINICAL DATA:  Hydronephrosis. Review of radiologic records demonstrates history of bladder cancer. EXAM: RENAL / URINARY TRACT ULTRASOUND COMPLETE COMPARISON:  CT 6 hours prior.  CT 08/09/2017 FINDINGS: Right Kidney: Length: 11.0 cm. Mild prominence of the right renal pelvis without frank hydronephrosis. Echogenicity is normal. No evidence of mass. Left Kidney: Length: 12.6 cm. Moderate hydronephrosis is seen on prior CT. No shadowing stone. Bladder: Nondistended with irregular wall thickening measuring 5.8 x 1.7 x 3.6 cm with internal vascularity corresponding to mass on CT. IMPRESSION: 1. Moderate left hydronephrosis, similar to prior exams. No right hydronephrosis. 2. Nondistended urinary bladder with irregular bladder mass, previously characterized on CT. Electronically Signed   By: Jeb Levering M.D.   On: 08/30/2017 01:18   Dg Chest Port 1  View  Result Date: 08/29/2017 CLINICAL DATA:  Altered mental status. EXAM: PORTABLE CHEST 1 VIEW COMPARISON:  Radiographs of December 12, 2016. FINDINGS: The heart size and mediastinal contours are within normal limits. Atherosclerosis of thoracic aorta is noted. No pneumothorax or pleural effusion is noted. Left lung is clear. Mild right basilar subsegmental atelectasis is noted. The visualized skeletal structures are unremarkable. IMPRESSION: Mild right basilar subsegmental atelectasis. Aortic Atherosclerosis (ICD10-I70.0). Electronically Signed   By: Marijo Conception, M.D.   On: 08/29/2017 17:42    DISCHARGE EXAMINATION: Vitals:   09/02/17 0515 09/02/17 1322 09/02/17 2048 09/03/17 0433  BP: (!) 169/70 (!) 169/72 (!) 148/94 (!) 158/90  Pulse: (!) 56 60 96 84  Resp: 20 20 16 16   Temp: 97.6 F (36.4 C) (!) 97.5 F (36.4 C) 97.7 F (36.5 C) 98.1 F (36.7 C)  TempSrc: Oral Oral Oral Oral  SpO2: 96% 95% 97% 99%  Weight:      Height:       General appearance: alert, cooperative, appears stated age, distracted and no distress Resp: clear to auscultation bilaterally Cardio: regular rate and rhythm, S1, S2 normal, no murmur, click, rub or gallop GI: soft, non-tender; bowel sounds normal; no masses,  no organomegaly  DISPOSITION: Skilled nursing facility  Discharge Instructions    Call MD for:  difficulty breathing, headache or visual disturbances   Complete by:  As directed    Call MD for:  extreme fatigue   Complete by:  As directed    Call MD for:  persistant dizziness or light-headedness   Complete by:  As directed    Call MD for:  persistant nausea and vomiting   Complete by:  As directed    Call MD for:  severe uncontrolled pain   Complete by:  As directed    Call MD for:  temperature >100.4   Complete by:  As directed    Discharge instructions   Complete by:  As directed    Please review instructions on the discharge summary.  You were cared for by a hospitalist during your  hospital stay. If you have any questions about your discharge medications or the care you received while you were in the hospital after you are discharged, you can call the unit and asked to speak with the hospitalist on call if the hospitalist that took care of you is not available. Once you are discharged, your primary care physician will handle  any further medical issues. Please note that NO REFILLS for any discharge medications will be authorized once you are discharged, as it is imperative that you return to your primary care physician (or establish a relationship with a primary care physician if you do not have one) for your aftercare needs so that they can reassess your need for medications and monitor your lab values. If you do not have a primary care physician, you can call [phone] for a physician referral.   Increase activity slowly   Complete by:  As directed          Allergies as of 09/03/2017   No Known Allergies     Medication List    STOP taking these medications   glipiZIDE 10 MG 24 hr tablet Commonly known as:  GLUCOTROL XL     TAKE these medications   acetaminophen 500 MG tablet Commonly known as:  TYLENOL Take 500 mg by mouth every 6 (six) hours as needed for moderate pain.   amLODipine 5 MG tablet Commonly known as:  NORVASC Take 1 tablet (5 mg total) by mouth daily.   amoxicillin-clavulanate 875-125 MG tablet Commonly known as:  AUGMENTIN Take 1 tablet by mouth every 12 (twelve) hours for 10 days.   artificial tears Oint ophthalmic ointment Commonly known as:  LACRILUBE Place into both eyes.   docusate sodium 100 MG capsule Commonly known as:  COLACE Take 100 mg by mouth 2 (two) times daily.   ENSURE NUTRITION SHAKE Liqd Take 1 Can by mouth 3 (three) times daily between meals.   hydrocortisone 25 MG suppository Commonly known as:  ANUSOL-HC Place 1 suppository (25 mg total) rectally 2 (two) times daily. What changed:    when to take this  reasons  to take this   metoprolol tartrate 50 MG tablet Commonly known as:  LOPRESSOR Take 1 tablet (50 mg total) by mouth 2 (two) times daily.   MILK OF MAGNESIA CONCENTRATE PO Take 30 mLs by mouth daily as needed (for constipation).   nitroGLYCERIN 0.4 MG SL tablet Commonly known as:  NITROSTAT Place 0.4 mg under the tongue every 5 (five) minutes as needed for chest pain.   oxyCODONE 5 MG immediate release tablet Commonly known as:  Oxy IR/ROXICODONE Take 0.5 tablets (2.5 mg total) by mouth every 6 (six) hours as needed for moderate pain.   polyethylene glycol packet Commonly known as:  MIRALAX / GLYCOLAX Take 17 g by mouth daily.   potassium chloride SA 20 MEQ tablet Commonly known as:  K-DUR,KLOR-CON Take 1 tablet (20 mEq total) by mouth daily.   PRESERVISION AREDS PO Take by mouth.   rosuvastatin 20 MG tablet Commonly known as:  CRESTOR Take 20 mg by mouth daily.   saccharomyces boulardii 250 MG capsule Commonly known as:  FLORASTOR Take 1 capsule (250 mg total) by mouth 2 (two) times daily.   senna 8.6 MG Tabs tablet Commonly known as:  SENOKOT Take 1 tablet (8.6 mg total) by mouth daily as needed for mild constipation.        Contact information for after-discharge care    [facility] Follow up.   Service:  Skilled Nursing Contact information: [place] [phone]              TOTAL DISCHARGE TIME: 61 minutes  Bonnielee Haff  [facility] Pager [phone]  09/03/2017, 10:27 AM

## 2017-09-04 ENCOUNTER — Inpatient Hospital Stay (HOSPITAL_COMMUNITY)
Admission: EM | Admit: 2017-09-04 | Discharge: 2017-09-08 | DRG: 871 | Disposition: A | Discharge status: Hospice | Payer: Medicare Other | Attending: Internal Medicine | Admitting: Internal Medicine

## 2017-09-04 ENCOUNTER — Emergency Department (HOSPITAL_COMMUNITY): Payer: Medicare Other

## 2017-09-04 ENCOUNTER — Other Ambulatory Visit: Payer: Self-pay

## 2017-09-04 DIAGNOSIS — N390 Urinary tract infection, site not specified: Secondary | ICD-10-CM | POA: Diagnosis present

## 2017-09-04 DIAGNOSIS — Z955 Presence of coronary angioplasty implant and graft: Secondary | ICD-10-CM

## 2017-09-04 DIAGNOSIS — I10 Essential (primary) hypertension: Secondary | ICD-10-CM | POA: Diagnosis present

## 2017-09-04 DIAGNOSIS — A419 Sepsis, unspecified organism: Principal | ICD-10-CM | POA: Diagnosis present

## 2017-09-04 DIAGNOSIS — E870 Hyperosmolality and hypernatremia: Secondary | ICD-10-CM | POA: Diagnosis present

## 2017-09-04 DIAGNOSIS — Z87891 Personal history of nicotine dependence: Secondary | ICD-10-CM

## 2017-09-04 DIAGNOSIS — I252 Old myocardial infarction: Secondary | ICD-10-CM

## 2017-09-04 DIAGNOSIS — Z66 Do not resuscitate: Secondary | ICD-10-CM | POA: Diagnosis present

## 2017-09-04 DIAGNOSIS — C7970 Secondary malignant neoplasm of unspecified adrenal gland: Secondary | ICD-10-CM | POA: Diagnosis present

## 2017-09-04 DIAGNOSIS — R6520 Severe sepsis without septic shock: Secondary | ICD-10-CM | POA: Diagnosis present

## 2017-09-04 DIAGNOSIS — N183 Chronic kidney disease, stage 3 unspecified: Secondary | ICD-10-CM | POA: Diagnosis present

## 2017-09-04 DIAGNOSIS — I129 Hypertensive chronic kidney disease with stage 1 through stage 4 chronic kidney disease, or unspecified chronic kidney disease: Secondary | ICD-10-CM | POA: Diagnosis present

## 2017-09-04 DIAGNOSIS — N179 Acute kidney failure, unspecified: Secondary | ICD-10-CM | POA: Diagnosis not present

## 2017-09-04 DIAGNOSIS — Z7189 Other specified counseling: Secondary | ICD-10-CM

## 2017-09-04 DIAGNOSIS — R945 Abnormal results of liver function studies: Secondary | ICD-10-CM

## 2017-09-04 DIAGNOSIS — F0390 Unspecified dementia without behavioral disturbance: Secondary | ICD-10-CM | POA: Diagnosis present

## 2017-09-04 DIAGNOSIS — E119 Type 2 diabetes mellitus without complications: Secondary | ICD-10-CM

## 2017-09-04 DIAGNOSIS — J9811 Atelectasis: Secondary | ICD-10-CM | POA: Diagnosis present

## 2017-09-04 DIAGNOSIS — R7989 Other specified abnormal findings of blood chemistry: Secondary | ICD-10-CM | POA: Diagnosis present

## 2017-09-04 DIAGNOSIS — C787 Secondary malignant neoplasm of liver and intrahepatic bile duct: Secondary | ICD-10-CM | POA: Diagnosis present

## 2017-09-04 DIAGNOSIS — C679 Malignant neoplasm of bladder, unspecified: Secondary | ICD-10-CM | POA: Diagnosis present

## 2017-09-04 DIAGNOSIS — I2510 Atherosclerotic heart disease of native coronary artery without angina pectoris: Secondary | ICD-10-CM | POA: Diagnosis present

## 2017-09-04 DIAGNOSIS — E876 Hypokalemia: Secondary | ICD-10-CM | POA: Diagnosis present

## 2017-09-04 DIAGNOSIS — C7951 Secondary malignant neoplasm of bone: Secondary | ICD-10-CM | POA: Diagnosis present

## 2017-09-04 DIAGNOSIS — G9340 Encephalopathy, unspecified: Secondary | ICD-10-CM | POA: Diagnosis present

## 2017-09-04 DIAGNOSIS — Z515 Encounter for palliative care: Secondary | ICD-10-CM

## 2017-09-04 DIAGNOSIS — Z923 Personal history of irradiation: Secondary | ICD-10-CM

## 2017-09-04 DIAGNOSIS — E1122 Type 2 diabetes mellitus with diabetic chronic kidney disease: Secondary | ICD-10-CM | POA: Diagnosis present

## 2017-09-04 DIAGNOSIS — G9341 Metabolic encephalopathy: Secondary | ICD-10-CM | POA: Diagnosis present

## 2017-09-04 LAB — CULTURE, BLOOD (ROUTINE X 2)
Culture: NO GROWTH
Culture: NO GROWTH
Special Requests: ADEQUATE
Special Requests: ADEQUATE

## 2017-09-04 LAB — I-STAT CG4 LACTIC ACID, ED: Lactic Acid, Venous: 6.48 mmol/L (ref 0.5–1.9)

## 2017-09-04 MED ORDER — SODIUM CHLORIDE 0.9 % IV BOLUS (SEPSIS)
250.0000 mL | Freq: Once | INTRAVENOUS | Status: AC
  Administered 2017-09-05: 250 mL via INTRAVENOUS

## 2017-09-04 MED ORDER — SODIUM CHLORIDE 0.9 % IV BOLUS (SEPSIS)
1000.0000 mL | Freq: Once | INTRAVENOUS | Status: AC
  Administered 2017-09-04: 1000 mL via INTRAVENOUS

## 2017-09-04 MED ORDER — ACETAMINOPHEN 650 MG RE SUPP
650.0000 mg | Freq: Once | RECTAL | Status: AC
  Administered 2017-09-04: 650 mg via RECTAL
  Filled 2017-09-04: qty 1

## 2017-09-04 MED ORDER — PIPERACILLIN-TAZOBACTAM 3.375 G IVPB 30 MIN
3.3750 g | Freq: Once | INTRAVENOUS | Status: AC
  Administered 2017-09-04: 3.375 g via INTRAVENOUS
  Filled 2017-09-04: qty 50

## 2017-09-04 MED ORDER — VANCOMYCIN HCL IN DEXTROSE 1-5 GM/200ML-% IV SOLN
1000.0000 mg | Freq: Once | INTRAVENOUS | Status: AC
  Administered 2017-09-04: 1000 mg via INTRAVENOUS
  Filled 2017-09-04: qty 200

## 2017-09-04 NOTE — ED Notes (Signed)
Notified EDP,Allen,MD. Pt. I-stat CG4 Lactic acid results 6.48 and RN,Lindsay made aware.

## 2017-09-04 NOTE — ED Notes (Signed)
Bed: RESB Expected date:  Expected time:  Means of arrival:  Comments: EMS-[facility]-Poss sepsis

## 2017-09-04 NOTE — ED Provider Notes (Signed)
[facility] DEPT Provider Note   CSN: [id number] Arrival date & time: 09/04/17  2315     History   Chief Complaint Chief Complaint  Patient presents with  . Altered Mental Status    HPI [name] Cook is a 84 y.o. male.  84 year old male here from nursing home with altered mental status times 1 day.  Patient has prior history of dementia as well as sepsis.  Patient had a chest x-ray today for possible aspiration and those results were negative.  There is no reported history of trauma.  Patient's baseline is that he can communicate somewhat.  He is unable to do that at this time.  EMS called and patient noted to have a tympanic temperature of 99.1.  Was transported here for further management      Past Medical History:  Diagnosis Date  . Bladder cancer ([place])   . Coronary artery disease   . Dementia    mild  . Diabetes mellitus without complication ([place])   . HOH (hard of hearing)    does not wear hearing aides, but can hear you if you speak loudly  . Hypertension   . Skin cancer 2002   on face  . STEMI involving right coronary artery ([place]) 2008   S/P 2.5 x 20 mm Taxus DES RCA, med rx for 30% LAD, 80% OM1 & dCFX, EF 60%    Patient Active Problem List   Diagnosis Date Noted  . Sepsis ([place]) 08/29/2017  . Cancer of bladder ([place]) 08/19/2017  . Bladder disease 07/19/2017  . Compression fracture of L2 ([employer]) 05/24/2017  . Rhabdomyolysis 05/24/2017  . Mass of urinary bladder determined by ultrasound 05/24/2017  . Dementia 05/24/2017  . Compression fracture of body of thoracic vertebra ([place]) 05/17/2017  . Diabetes mellitus without complication ([place])   . Coronary artery disease   . Hypertension     Past Surgical History:  Procedure Laterality Date  . APPENDECTOMY    . CATARACT EXTRACTION W/PHACO Left 06/10/2016   Procedure: CATARACT EXTRACTION PHACO AND INTRAOCULAR LENS PLACEMENT (IOC);  Surgeon: Estill Cotta, MD;  Location: [facility] ORS;   Service: Ophthalmology;  Laterality: Left;  Lot # [id number] H [place]: 01:04.0 AP% 25.5 CDE: 29.46  . CHOLECYSTECTOMY    . CORONARY ANGIOPLASTY WITH STENT PLACEMENT  2008   2.5 x 20 mm Taxus DES to 95% RCA, 80% dCFX, 80% OM1, 30% LAD > med rx, EF 60%  . EYE SURGERY    . HERNIA REPAIR     bilateral inguinal hernia  . PROSTATE SURGERY    . TRANSURETHRAL RESECTION OF BLADDER TUMOR N/A 07/19/2017   Procedure: TRANSURETHRAL RESECTION OF BLADDER TUMOR (TURBT);  Surgeon: Lucas Mallow, MD;  Location: [facility] ORS;  Service: Urology;  Laterality: N/A;       Home Medications    Prior to Admission medications   Medication Sig Start Date End Date Taking? Authorizing Provider  acetaminophen (TYLENOL) 500 MG tablet Take 500 mg by mouth every 6 (six) hours as needed for moderate pain.    [provider]  amLODipine (NORVASC) 5 MG tablet Take 1 tablet (5 mg total) by mouth daily. 09/03/17   Bonnielee Haff, MD  amoxicillin-clavulanate (AUGMENTIN) 875-125 MG tablet Take 1 tablet by mouth every 12 (twelve) hours for 10 days. 09/03/17 09/13/17  Bonnielee Haff, MD  artificial tears (LACRILUBE) OINT ophthalmic ointment Place into both eyes.    [provider]  docusate sodium (COLACE) 100 MG capsule Take 100  mg by mouth 2 (two) times daily.    [provider]  hydrocortisone (ANUSOL-HC) 25 MG suppository Place 1 suppository (25 mg total) rectally 2 (two) times daily. 09/02/17   Bonnielee Haff, MD  Magnesium Hydroxide (MILK OF MAGNESIA CONCENTRATE PO) Take 30 mLs by mouth daily as needed (for constipation).    [provider]  metoprolol tartrate (LOPRESSOR) 50 MG tablet Take 1 tablet (50 mg total) by mouth 2 (two) times daily. 12/22/16   Barrett, Evelene Croon, PA-C  Multiple Vitamins-Minerals (PRESERVISION AREDS PO) Take by mouth.    [provider]  nitroGLYCERIN (NITROSTAT) 0.4 MG SL tablet Place 0.4 mg under the tongue every 5 (five) minutes as needed for chest pain.     [provider]  Nutritional Supplements (ENSURE NUTRITION SHAKE) LIQD Take 1 Can by mouth 3 (three) times daily between meals. 09/02/17   Bonnielee Haff, MD  oxyCODONE (OXY IR/ROXICODONE) 5 MG immediate release tablet Take 0.5 tablets (2.5 mg total) by mouth every 6 (six) hours as needed for moderate pain. 09/02/17   Bonnielee Haff, MD  polyethylene glycol [facility] / Floria Raveling) packet Take 17 g by mouth daily.    [provider]  potassium chloride SA (K-DUR,KLOR-CON) 20 MEQ tablet Take 1 tablet (20 mEq total) by mouth daily. 09/03/17   Bonnielee Haff, MD  rosuvastatin (CRESTOR) 20 MG tablet Take 20 mg by mouth daily.     [provider]  saccharomyces boulardii (FLORASTOR) 250 MG capsule Take 1 capsule (250 mg total) by mouth 2 (two) times daily. 09/03/17   Bonnielee Haff, MD  senna (SENOKOT) 8.6 MG TABS tablet Take 1 tablet (8.6 mg total) by mouth daily as needed for mild constipation. 05/20/17   Domenic Polite, MD    Family History Family History  Problem Relation Age of Onset  . Cancer Neg Hx     Social History Social History   Tobacco Use  . Smoking status: Former Smoker    Packs/day: 0.50    Years: 20.00    Pack years: 10.00    Types: Cigarettes    Last attempt to quit: 06/29/1968    Years since quitting: 49.2  . Smokeless tobacco: Never Used  Substance Use Topics  . Alcohol use: No  . Drug use: No     Allergies   Patient has no known allergies.   Review of Systems Review of Systems  Unable to perform ROS: Dementia     Physical Exam Updated Vital Signs There were no vitals taken for this visit.  Physical Exam  Constitutional: He appears lethargic. He appears cachectic.  Non-toxic appearance. No distress.  HENT:  Head: Normocephalic and atraumatic.  Eyes: Conjunctivae, EOM and lids are normal. Pupils are equal, round, and reactive to light.  Neck: Normal range of motion. Neck supple. No tracheal deviation present. No thyroid mass  present.  Cardiovascular: Regular rhythm and normal heart sounds. Tachycardia present. Exam reveals no gallop.  No murmur heard. Pulmonary/Chest: Effort normal and breath sounds normal. No stridor. No respiratory distress. He has no decreased breath sounds. He has no wheezes. He has no rhonchi. He has no rales.  Abdominal: Soft. Normal appearance and bowel sounds are normal. He exhibits no distension. There is no tenderness. There is no rebound and no CVA tenderness.  Musculoskeletal: Normal range of motion. He exhibits no edema or tenderness.  Neurological: He appears lethargic. He displays atrophy. GCS eye subscore is 4. GCS verbal subscore is 4. GCS motor subscore is 5.  Skin: Skin is warm and dry. No abrasion and no rash noted.  Psychiatric: He is inattentive.  Nursing note and vitals reviewed.    ED Treatments / Results  Labs (all labs ordered are listed, but only abnormal results are displayed) Labs Reviewed  CULTURE, BLOOD (ROUTINE X 2)  CULTURE, BLOOD (ROUTINE X 2)  COMPREHENSIVE METABOLIC PANEL  CBC WITH DIFFERENTIAL/PLATELET  URINALYSIS, ROUTINE W REFLEX MICROSCOPIC  I-STAT CG4 LACTIC ACID, ED    EKG  EKG Interpretation None       Radiology No results found.  Procedures Procedures (including critical care time)  Medications Ordered in ED Medications  sodium chloride 0.9 % bolus 1,000 mL (not administered)    And  sodium chloride 0.9 % bolus 1,000 mL (not administered)    And  sodium chloride 0.9 % bolus 250 mL (not administered)  piperacillin-tazobactam (ZOSYN) IVPB 3.375 g (not administered)  vancomycin (VANCOCIN) IVPB 1000 mg/200 mL premix (not administered)  acetaminophen (TYLENOL) suppository 650 mg (not administered)     Initial Impression / Assessment and Plan / ED Course  I have reviewed the triage vital signs and the nursing notes.  Pertinent labs & imaging results that were available during my care of the patient were reviewed by me and  considered in my medical decision making (see chart for details).     Code sepsis called when patient arrived.  Given rectal Tylenol.  IV fluids as well as antibiotics ordered.  Patient's old records were reviewed extensively and patient had just been discharged from the hospital 24 hours prior after admission for urosepsis.  He was discharged on Augmentin and patient did receive Zosyn while here.  Patient's lactate elevated at 6.48.  Patient also with evidence of acute kidney injury as well as hypokalemia.  Was bolused with 30 cc/kg as well as given IV potassium.  Long discussion with family and patient is a full code at this time.  Will consult hospitalist for admission   CRITICAL CARE Performed by: Leota Jacobsen Total critical care time: 60 minutes Critical care time was exclusive of separately billable procedures and treating other patients. Critical care was necessary to treat or prevent imminent or life-threatening deterioration. Critical care was time spent personally by me on the following activities: development of treatment plan with patient and/or surrogate as well as nursing, discussions with consultants, evaluation of patient's response to treatment, examination of patient, obtaining history from patient or surrogate, ordering and performing treatments and interventions, ordering and review of laboratory studies, ordering and review of radiographic studies, pulse oximetry and re-evaluation of patient's condition.  Final Clinical Impressions(s) / ED Diagnoses   Final diagnoses:  None    ED Discharge Orders    None       Lacretia Leigh, MD 09/05/17 0116

## 2017-09-04 NOTE — Progress Notes (Signed)
A consult was received from an ED physician for vancomycin and zosyn per pharmacy dosing.  The patient's profile has been reviewed for ht/wt/allergies/indication/available labs.    A one time order has been placed for vancomycin 1000 mg and zosyn 3.375 gm.  Further antibiotics/pharmacy consults should be ordered by admitting physician if indicated.                       Thank you, Lynelle Doctor 09/04/2017  11:34 PM

## 2017-09-04 NOTE — ED Triage Notes (Signed)
Pt arriving from [facility] for [facility]. Staff at facility unable to report pt last seen normal or any other information.   Resp 44 90% room air 96% on 2L 90/50 HR 100-172 CBG 208

## 2017-09-05 ENCOUNTER — Encounter (HOSPITAL_COMMUNITY): Payer: Self-pay | Admitting: Family Medicine

## 2017-09-05 ENCOUNTER — Inpatient Hospital Stay (HOSPITAL_COMMUNITY): Payer: Medicare Other

## 2017-09-05 DIAGNOSIS — N179 Acute kidney failure, unspecified: Secondary | ICD-10-CM

## 2017-09-05 DIAGNOSIS — Z955 Presence of coronary angioplasty implant and graft: Secondary | ICD-10-CM | POA: Diagnosis not present

## 2017-09-05 DIAGNOSIS — R6520 Severe sepsis without septic shock: Secondary | ICD-10-CM | POA: Diagnosis present

## 2017-09-05 DIAGNOSIS — N183 Chronic kidney disease, stage 3 unspecified: Secondary | ICD-10-CM | POA: Diagnosis present

## 2017-09-05 DIAGNOSIS — Z515 Encounter for palliative care: Secondary | ICD-10-CM | POA: Diagnosis present

## 2017-09-05 DIAGNOSIS — R945 Abnormal results of liver function studies: Secondary | ICD-10-CM | POA: Diagnosis not present

## 2017-09-05 DIAGNOSIS — C787 Secondary malignant neoplasm of liver and intrahepatic bile duct: Secondary | ICD-10-CM | POA: Diagnosis present

## 2017-09-05 DIAGNOSIS — Z87891 Personal history of nicotine dependence: Secondary | ICD-10-CM | POA: Diagnosis not present

## 2017-09-05 DIAGNOSIS — E1122 Type 2 diabetes mellitus with diabetic chronic kidney disease: Secondary | ICD-10-CM | POA: Diagnosis present

## 2017-09-05 DIAGNOSIS — G9341 Metabolic encephalopathy: Secondary | ICD-10-CM | POA: Diagnosis present

## 2017-09-05 DIAGNOSIS — Z923 Personal history of irradiation: Secondary | ICD-10-CM | POA: Diagnosis not present

## 2017-09-05 DIAGNOSIS — I2510 Atherosclerotic heart disease of native coronary artery without angina pectoris: Secondary | ICD-10-CM

## 2017-09-05 DIAGNOSIS — E119 Type 2 diabetes mellitus without complications: Secondary | ICD-10-CM | POA: Diagnosis not present

## 2017-09-05 DIAGNOSIS — I10 Essential (primary) hypertension: Secondary | ICD-10-CM | POA: Diagnosis not present

## 2017-09-05 DIAGNOSIS — I252 Old myocardial infarction: Secondary | ICD-10-CM | POA: Diagnosis not present

## 2017-09-05 DIAGNOSIS — J9811 Atelectasis: Secondary | ICD-10-CM | POA: Diagnosis present

## 2017-09-05 DIAGNOSIS — C7951 Secondary malignant neoplasm of bone: Secondary | ICD-10-CM | POA: Diagnosis present

## 2017-09-05 DIAGNOSIS — F0390 Unspecified dementia without behavioral disturbance: Secondary | ICD-10-CM | POA: Diagnosis not present

## 2017-09-05 DIAGNOSIS — G9340 Encephalopathy, unspecified: Secondary | ICD-10-CM | POA: Diagnosis not present

## 2017-09-05 DIAGNOSIS — R7989 Other specified abnormal findings of blood chemistry: Secondary | ICD-10-CM | POA: Diagnosis present

## 2017-09-05 DIAGNOSIS — C672 Malignant neoplasm of lateral wall of bladder: Secondary | ICD-10-CM | POA: Diagnosis not present

## 2017-09-05 DIAGNOSIS — C7970 Secondary malignant neoplasm of unspecified adrenal gland: Secondary | ICD-10-CM | POA: Diagnosis present

## 2017-09-05 DIAGNOSIS — Z7189 Other specified counseling: Secondary | ICD-10-CM | POA: Diagnosis not present

## 2017-09-05 DIAGNOSIS — C679 Malignant neoplasm of bladder, unspecified: Secondary | ICD-10-CM | POA: Diagnosis present

## 2017-09-05 DIAGNOSIS — I129 Hypertensive chronic kidney disease with stage 1 through stage 4 chronic kidney disease, or unspecified chronic kidney disease: Secondary | ICD-10-CM | POA: Diagnosis present

## 2017-09-05 DIAGNOSIS — E870 Hyperosmolality and hypernatremia: Secondary | ICD-10-CM | POA: Diagnosis present

## 2017-09-05 DIAGNOSIS — Z66 Do not resuscitate: Secondary | ICD-10-CM | POA: Diagnosis present

## 2017-09-05 DIAGNOSIS — A419 Sepsis, unspecified organism: Secondary | ICD-10-CM | POA: Diagnosis present

## 2017-09-05 DIAGNOSIS — N390 Urinary tract infection, site not specified: Secondary | ICD-10-CM | POA: Diagnosis present

## 2017-09-05 DIAGNOSIS — E876 Hypokalemia: Secondary | ICD-10-CM | POA: Diagnosis present

## 2017-09-05 LAB — COMPREHENSIVE METABOLIC PANEL
ALK PHOS: 517 U/L — AB (ref 38–126)
ALK PHOS: 516 U/L — AB (ref 38–126)
ALT: 206 U/L — ABNORMAL HIGH (ref 17–63)
ALT: 98 U/L — AB (ref 17–63)
AST: 189 U/L — ABNORMAL HIGH (ref 15–41)
AST: 703 U/L — AB (ref 15–41)
Albumin: 2.3 g/dL — ABNORMAL LOW (ref 3.5–5.0)
Albumin: 2 g/dL — ABNORMAL LOW (ref 3.5–5.0)
Anion gap: 17 — ABNORMAL HIGH (ref 5–15)
Anion gap: 10 (ref 5–15)
BILIRUBIN TOTAL: 1.7 mg/dL — AB (ref 0.3–1.2)
BILIRUBIN TOTAL: 1.2 mg/dL (ref 0.3–1.2)
BUN: 38 mg/dL — ABNORMAL HIGH (ref 6–20)
BUN: 36 mg/dL — AB (ref 6–20)
CALCIUM: 7.2 mg/dL — AB (ref 8.9–10.3)
CALCIUM: 8 mg/dL — AB (ref 8.9–10.3)
CO2: 21 mmol/L — ABNORMAL LOW (ref 22–32)
CO2: 24 mmol/L (ref 22–32)
CREATININE: 2.38 mg/dL — AB (ref 0.61–1.24)
CREATININE: 2.33 mg/dL — AB (ref 0.61–1.24)
Chloride: 102 mmol/L (ref 101–111)
Chloride: 108 mmol/L (ref 101–111)
GFR calc Af Amer: 26 mL/min — ABNORMAL LOW (ref >60)
GFR, EST AFRICAN AMERICAN: 27 mL/min — AB (ref >60)
GFR, EST NON AFRICAN AMERICAN: 23 mL/min — AB (ref >60)
GFR, EST NON AFRICAN AMERICAN: 23 mL/min — AB (ref >60)
GLUCOSE: 194 mg/dL — AB (ref 65–99)
Glucose, Bld: 172 mg/dL — ABNORMAL HIGH (ref 65–99)
POTASSIUM: 4.5 mmol/L (ref 3.5–5.1)
Potassium: 3.1 mmol/L — ABNORMAL LOW (ref 3.5–5.1)
Sodium: 140 mmol/L (ref 135–145)
Sodium: 142 mmol/L (ref 135–145)
TOTAL PROTEIN: 6.3 g/dL — AB (ref 6.5–8.1)
Total Protein: 7 g/dL (ref 6.5–8.1)

## 2017-09-05 LAB — URINALYSIS, ROUTINE W REFLEX MICROSCOPIC
BILIRUBIN URINE: 0 — AB
Bacteria, UA: NONE SEEN
Glucose, UA: 0 mg/dL — AB
Hgb urine dipstick: 3 — AB
Ketones, ur: 0 mg/dL — AB
LEUKOCYTES UA: 2 — AB
NITRITE: 0 — AB
PROTEIN: 300 mg/dL — AB
SPECIFIC GRAVITY, URINE: 1.013 (ref 1.005–1.030)
pH: 6 (ref 5.0–8.0)

## 2017-09-05 LAB — CBC WITH DIFFERENTIAL/PLATELET
BASOS ABS: 0 10*3/uL (ref 0.0–0.1)
Basophils Absolute: 0 10*3/uL (ref 0.0–0.1)
Basophils Relative: 0 %
Basophils Relative: 1 %
EOS ABS: 0 10*3/uL (ref 0.0–0.7)
EOS ABS: 0 10*3/uL (ref 0.0–0.7)
Eosinophils Relative: 0 %
Eosinophils Relative: 0 %
HCT: 39.4 % (ref 39.0–52.0)
HCT: 38.1 % — ABNORMAL LOW (ref 39.0–52.0)
Hemoglobin: 12.7 g/dL — ABNORMAL LOW (ref 13.0–17.0)
Hemoglobin: 12.1 g/dL — ABNORMAL LOW (ref 13.0–17.0)
LYMPHS ABS: 0.8 10*3/uL (ref 0.7–4.0)
Lymphocytes Relative: 9 %
Lymphocytes Relative: 17 %
Lymphs Abs: 0.9 10*3/uL (ref 0.7–4.0)
MCH: 28.2 pg (ref 26.0–34.0)
MCH: 27.8 pg (ref 26.0–34.0)
MCHC: 31.8 g/dL (ref 30.0–36.0)
MCHC: 32.2 g/dL (ref 30.0–36.0)
MCV: 87.6 fL (ref 78.0–100.0)
MCV: 87.4 fL (ref 78.0–100.0)
MONO ABS: 0.3 10*3/uL (ref 0.1–1.0)
MONO ABS: 0.4 10*3/uL (ref 0.1–1.0)
Monocytes Relative: 4 %
Monocytes Relative: 7 %
NEUTROS ABS: 3.7 10*3/uL (ref 1.7–7.7)
NEUTROS PCT: 87 %
Neutro Abs: 9.1 10*3/uL — ABNORMAL HIGH (ref 1.7–7.7)
Neutrophils Relative %: 75 %
Platelets: 170 10*3/uL (ref 150–400)
Platelets: 131 10*3/uL — ABNORMAL LOW (ref 150–400)
RBC: 4.35 MIL/uL (ref 4.22–5.81)
RBC: 4.51 MIL/uL (ref 4.22–5.81)
RDW: 15 % (ref 11.5–15.5)
RDW: 15 % (ref 11.5–15.5)
WBC: 4.8 10*3/uL (ref 4.0–10.5)
WBC: 10.4 10*3/uL (ref 4.0–10.5)

## 2017-09-05 LAB — GLUCOSE, CAPILLARY
GLUCOSE-CAPILLARY: 145 mg/dL — AB (ref 65–99)
Glucose-Capillary: 143 mg/dL — ABNORMAL HIGH (ref 65–99)

## 2017-09-05 LAB — CBG MONITORING, ED
GLUCOSE-CAPILLARY: 173 mg/dL — AB (ref 65–99)
Glucose-Capillary: 177 mg/dL — ABNORMAL HIGH (ref 65–99)
Glucose-Capillary: 119 mg/dL — ABNORMAL HIGH (ref 65–99)

## 2017-09-05 LAB — I-STAT CG4 LACTIC ACID, ED
LACTIC ACID, VENOUS: 3.55 mmol/L — AB (ref 0.5–1.9)
Lactic Acid, Venous: 3.57 mmol/L (ref 0.5–1.9)

## 2017-09-05 LAB — LACTIC ACID, PLASMA
LACTIC ACID, VENOUS: 3.1 mmol/L — AB (ref 0.5–1.9)
LACTIC ACID, VENOUS: 2.2 mmol/L — AB (ref 0.5–1.9)
Lactic Acid, Venous: 2.9 mmol/L (ref 0.5–1.9)

## 2017-09-05 LAB — MAGNESIUM: MAGNESIUM: 2 mg/dL (ref 1.7–2.4)

## 2017-09-05 LAB — MRSA PCR SCREENING: MRSA BY PCR: NEGATIVE

## 2017-09-05 MED ORDER — PIPERACILLIN-TAZOBACTAM 3.375 G IVPB: 3.3750 g | Freq: Three times a day (TID) | INTRAVENOUS | Status: DC

## 2017-09-05 MED ORDER — TRAZODONE HCL 50 MG PO TABS: 25.0000 mg | ORAL_TABLET | Freq: Every evening | ORAL | Status: DC | PRN

## 2017-09-05 MED ORDER — LORAZEPAM 0.5 MG PO TABS
0.2500 mg | ORAL_TABLET | Freq: Four times a day (QID) | ORAL | Status: DC | PRN
  Administered 2017-09-06: 0.25 mg via ORAL
  Filled 2017-09-05: qty 1

## 2017-09-05 MED ORDER — SENNOSIDES-DOCUSATE SODIUM 8.6-50 MG PO TABS: 1.0000 | ORAL_TABLET | Freq: Every evening | ORAL | Status: DC | PRN

## 2017-09-05 MED ORDER — DOCUSATE SODIUM 100 MG PO CAPS
100.0000 mg | ORAL_CAPSULE | Freq: Two times a day (BID) | ORAL | Status: DC
  Administered 2017-09-05 – 2017-09-06 (×2): 100 mg via ORAL
  Filled 2017-09-05 (×2): qty 1

## 2017-09-05 MED ORDER — VANCOMYCIN HCL IN DEXTROSE 1-5 GM/200ML-% IV SOLN
1000.0000 mg | INTRAVENOUS | Status: DC
  Administered 2017-09-05: 1000 mg via INTRAVENOUS
  Filled 2017-09-05: qty 200

## 2017-09-05 MED ORDER — FENTANYL CITRATE (PF) 100 MCG/2ML IJ SOLN
12.5000 ug | INTRAMUSCULAR | Status: DC | PRN
  Administered 2017-09-05 – 2017-09-08 (×8): 25 ug via INTRAVENOUS
  Filled 2017-09-05 (×8): qty 2

## 2017-09-05 MED ORDER — SODIUM CHLORIDE 0.9% FLUSH
3.0000 mL | Freq: Two times a day (BID) | INTRAVENOUS | Status: DC
  Administered 2017-09-05 – 2017-09-06 (×3): 3 mL via INTRAVENOUS

## 2017-09-05 MED ORDER — ONDANSETRON HCL 4 MG/2ML IJ SOLN: 4.0000 mg | Freq: Four times a day (QID) | INTRAMUSCULAR | Status: DC | PRN

## 2017-09-05 MED ORDER — OXYCODONE HCL 5 MG PO TABS: 2.5000 mg | ORAL_TABLET | Freq: Four times a day (QID) | ORAL | Status: DC | PRN

## 2017-09-05 MED ORDER — RESOURCE THICKENUP CLEAR PO POWD
ORAL | Status: DC | PRN
  Filled 2017-09-05: qty 125

## 2017-09-05 MED ORDER — SACCHAROMYCES BOULARDII 250 MG PO CAPS
250.0000 mg | ORAL_CAPSULE | Freq: Two times a day (BID) | ORAL | Status: DC
  Administered 2017-09-05 – 2017-09-06 (×2): 250 mg via ORAL
  Filled 2017-09-05 (×3): qty 1

## 2017-09-05 MED ORDER — ONDANSETRON HCL 4 MG PO TABS: 4.0000 mg | ORAL_TABLET | Freq: Four times a day (QID) | ORAL | Status: DC | PRN

## 2017-09-05 MED ORDER — MAGNESIUM SULFATE 2 GM/50ML IV SOLN
2.0000 g | Freq: Once | INTRAVENOUS | Status: AC
  Administered 2017-09-05: 2 g via INTRAVENOUS
  Filled 2017-09-05: qty 50

## 2017-09-05 MED ORDER — ROSUVASTATIN CALCIUM 20 MG PO TABS
20.0000 mg | ORAL_TABLET | Freq: Every day | ORAL | Status: DC
  Administered 2017-09-05: 20 mg via ORAL
  Filled 2017-09-05: qty 1

## 2017-09-05 MED ORDER — ACETAMINOPHEN 650 MG RE SUPP: 650.0000 mg | Freq: Four times a day (QID) | RECTAL | Status: DC | PRN

## 2017-09-05 MED ORDER — ACETAMINOPHEN 325 MG PO TABS
650.0000 mg | ORAL_TABLET | Freq: Four times a day (QID) | ORAL | Status: DC | PRN
  Administered 2017-09-06: 650 mg via ORAL
  Filled 2017-09-05: qty 2

## 2017-09-05 MED ORDER — METOPROLOL TARTRATE 50 MG PO TABS
50.0000 mg | ORAL_TABLET | Freq: Two times a day (BID) | ORAL | Status: DC
  Administered 2017-09-05 – 2017-09-08 (×7): 50 mg via ORAL
  Filled 2017-09-05: qty 1
  Filled 2017-09-05: qty 2
  Filled 2017-09-05: qty 1
  Filled 2017-09-05 (×2): qty 2
  Filled 2017-09-05 (×2): qty 1

## 2017-09-05 MED ORDER — HEPARIN SODIUM (PORCINE) 5000 UNIT/ML IJ SOLN
5000.0000 [IU] | Freq: Three times a day (TID) | INTRAMUSCULAR | Status: DC
  Administered 2017-09-05 – 2017-09-08 (×9): 5000 [IU] via SUBCUTANEOUS
  Filled 2017-09-05 (×10): qty 1

## 2017-09-05 MED ORDER — ENSURE ENLIVE PO LIQD
237.0000 mL | Freq: Three times a day (TID) | ORAL | Status: DC
  Administered 2017-09-06 – 2017-09-08 (×3): 237 mL via ORAL
  Filled 2017-09-05 (×3): qty 237

## 2017-09-05 MED ORDER — INSULIN ASPART 100 UNIT/ML ~~LOC~~ SOLN
0.0000 [IU] | SUBCUTANEOUS | Status: DC
  Administered 2017-09-05: 2 [IU] via SUBCUTANEOUS
  Administered 2017-09-05 – 2017-09-07 (×5): 1 [IU] via SUBCUTANEOUS
  Administered 2017-09-07 – 2017-09-08 (×2): 2 [IU] via SUBCUTANEOUS
  Administered 2017-09-08 (×2): 1 [IU] via SUBCUTANEOUS
  Filled 2017-09-05: qty 1

## 2017-09-05 MED ORDER — ENSURE NUTRITION SHAKE PO LIQD: 1.0000 | Freq: Three times a day (TID) | ORAL | Status: DC

## 2017-09-05 MED ORDER — HYDRALAZINE HCL 20 MG/ML IJ SOLN: 10.0000 mg | INTRAMUSCULAR | Status: DC | PRN

## 2017-09-05 MED ORDER — OXYCODONE HCL 5 MG PO TABS: 5.0000 mg | ORAL_TABLET | Freq: Four times a day (QID) | ORAL | Status: DC | PRN

## 2017-09-05 MED ORDER — MAGNESIUM CITRATE PO SOLN: 1.0000 | Freq: Once | ORAL | Status: DC | PRN

## 2017-09-05 MED ORDER — ARTIFICIAL TEARS OPHTHALMIC OINT
1.0000 "application " | TOPICAL_OINTMENT | Freq: Every day | OPHTHALMIC | Status: DC
  Administered 2017-09-05 – 2017-09-07 (×3): 1 via OPHTHALMIC
  Filled 2017-09-05: qty 3.5

## 2017-09-05 MED ORDER — POTASSIUM CHLORIDE 10 MEQ/100ML IV SOLN
10.0000 meq | Freq: Once | INTRAVENOUS | Status: AC
  Administered 2017-09-05: 10 meq via INTRAVENOUS
  Filled 2017-09-05: qty 100

## 2017-09-05 MED ORDER — OXYCODONE HCL 5 MG PO TABS
5.0000 mg | ORAL_TABLET | ORAL | Status: DC | PRN
  Administered 2017-09-05 – 2017-09-06 (×4): 5 mg via ORAL
  Filled 2017-09-05 (×4): qty 1

## 2017-09-05 MED ORDER — BISACODYL 10 MG RE SUPP: 10.0000 mg | Freq: Every day | RECTAL | Status: DC | PRN

## 2017-09-05 MED ORDER — PIPERACILLIN-TAZOBACTAM 3.375 G IVPB
3.3750 g | Freq: Three times a day (TID) | INTRAVENOUS | Status: DC
  Administered 2017-09-05 – 2017-09-06 (×3): 3.375 g via INTRAVENOUS
  Filled 2017-09-05 (×3): qty 50

## 2017-09-05 MED ORDER — HYDROCORTISONE ACETATE 25 MG RE SUPP
25.0000 mg | Freq: Two times a day (BID) | RECTAL | Status: DC
  Administered 2017-09-05 – 2017-09-08 (×5): 25 mg via RECTAL
  Filled 2017-09-05 (×7): qty 1

## 2017-09-05 MED ORDER — POTASSIUM CHLORIDE IN NACL 40-0.9 MEQ/L-% IV SOLN
INTRAVENOUS | Status: AC
  Administered 2017-09-05: 100 mL/h via INTRAVENOUS
  Filled 2017-09-05: qty 1000

## 2017-09-05 NOTE — ED Notes (Signed)
Bed: WA14 Expected date:  Expected time:  Means of arrival:  Comments: 

## 2017-09-05 NOTE — ED Notes (Signed)
Notified EDP,Allen,MD. Pt. I-stat CG4 Lactic acid results 3.55 and RN,Lindsay made aware.

## 2017-09-05 NOTE — Progress Notes (Signed)
Pharmacy Antibiotic Note  [name] Cook is a 84 y.o. male with bladder cancer recently treated for sepsis secondary to acute pyelonephritis and discharged from [facility] on 09/03/17.  Ucx on 3/3 showed MSSA.  During this hospitalization, he was treated with vanc, zosyn and then transitioned over to augmentin.  He presented back to the ED on 09/04/2017 with c/o AMS. To start zosyn and vancomycin for sepsis.  - scr 2.33 (crcl~22)  Plan: - zosyn 3.375 gm IV x1 over 30 min, then 3.375 gm IV q8h (infuse over 4 hrs) - vancomycin 1000 mg IV x1 in the ED, then q48h for est AUC 459 (first dose at [facility] on 3/10) - daily scr  ____________________________________     Temp (24hrs), Avg:100.4 F (38 C), Min:92.7 F (33.7 C), Max:102.2 F (39 C)  Recent Labs  Lab 08/29/17 1653 08/29/17 1733 08/29/17 2045 08/30/17 0409 08/31/17 0421 09/01/17 0354 09/02/17 0339 09/03/17 0354 09/04/17 2347 09/04/17 2353 09/05/17 0312  WBC 14.4*  --   --  12.8* 9.3  --  14.4*  --   --  10.4  --   CREATININE 1.77*  --   --  1.55* 1.57*  1.45* 1.58* 1.79* 1.53*  --  2.33*  --   LATICACIDVEN  --  3.80* 1.67  --   --   --   --   --  6.48*  --  3.55*    Estimated Creatinine Clearance: 22.5 mL/min (A) (by C-G formula based on SCr of 2.33 mg/dL (H)).    No Known Allergies   Thank you for allowing pharmacy to be a part of this patient's care.  Lynelle Doctor 09/05/2017 3:18 AM

## 2017-09-05 NOTE — ED Notes (Signed)
ED TO INPATIENT HANDOFF REPORT  Name/Age/Gender Grayland Jack 84 y.o. male  Code Status    Code Status Orders  (From admission, onward)        Start     Ordered   09/05/17 0308  Full code  Continuous     09/05/17 0313    Code Status History    Date Active Date Inactive Code Status Order ID Comments User Context   08/29/2017 23:51 09/03/2017 18:47 Full Code [id number]  Bennie Pierini, MD Inpatient   07/19/2017 16:32 07/20/2017 20:05 Full Code [id number]  Lucas Mallow, MD Inpatient   05/17/2017 17:14 05/20/2017 15:34 DNR [id number]  Tawni Millers, MD Inpatient   12/22/2016 10:46 03/04/2017 15:22 DNR [id number]  Lonn Georgia, PA-C Outpatient   12/13/2016 02:34 12/13/2016 15:46 Full Code [id number]  Ivor Costa, MD ED    Advance Directive Documentation     Most Recent Value  Type of Advance Directive  Healthcare Power of Attorney  Pre-existing out of facility DNR order (yellow form or pink MOST form)  No data  "MOST" Form in Place?  No data      Home/SNF/Other Skilled nursing facility  Chief Complaint ams  Level of Care/Admitting Diagnosis ED Disposition    ED Disposition Condition [facility]: [place] [[id number]]  Level of Care: Stepdown [14]  Admit to SDU based on following criteria: Severe physiological/psychological symptoms:  Any diagnosis requiring assessment & intervention at least every 4 hours on an ongoing basis to obtain desired patient outcomes including stability and rehabilitation  Diagnosis: Severe sepsis [facility]) [[id number]]  Admitting Physician: Vianne Bulls [[id number]]  Attending Physician: Vianne Bulls [[id number]]  Estimated length of stay: past midnight tomorrow  Certification:: I certify this patient will need inpatient services for at least 2 midnights  PT Class (Do Not Modify): Inpatient [101]  PT Acc Code (Do Not Modify): Private [1]       Medical History Past Medical History:  Diagnosis Date   . Bladder cancer ([place])   . Coronary artery disease   . Dementia    mild  . Diabetes mellitus without complication ([place])   . HOH (hard of hearing)    does not wear hearing aides, but can hear you if you speak loudly  . Hypertension   . Skin cancer 2002   on face  . STEMI involving right coronary artery ([place]) 2008   S/P 2.5 x 20 mm Taxus DES RCA, med rx for 30% LAD, 80% OM1 & dCFX, EF 60%    Allergies No Known Allergies  IV Location/Drains/Wounds Patient Lines/Drains/Airways Status   Active Line/Drains/Airways    Name:   Placement date:   Placement time:   Site:   Days:   Peripheral IV 09/04/17 Left Forearm   09/04/17    -    Forearm   1   Peripheral IV 09/04/17 Right Hand   09/04/17    2338    Hand   1   Peripheral IV 09/04/17 Right Forearm   09/04/17    2350    Forearm   1   Urethral Catheter Lanell Matar, RN Temperature probe 14 Fr.   09/05/17    0009    Temperature probe   less than 1          Labs/Imaging Results for orders placed or performed during the hospital encounter of 09/04/17 (from the past 48 hour(s))  I-Stat CG4 Lactic Acid, ED  (  not at  [facility])     Status: Abnormal   Collection Time: 09/04/17 11:47 PM  Result Value Ref Range   Lactic Acid, Venous 6.48 (HH) 0.5 - 1.9 mmol/L   Comment NOTIFIED PHYSICIAN   Comprehensive metabolic panel     Status: Abnormal   Collection Time: 09/04/17 11:53 PM  Result Value Ref Range   Sodium 140 135 - 145 mmol/L   Potassium 3.1 (L) 3.5 - 5.1 mmol/L    Comment: DELTA CHECK NOTED   Chloride 102 101 - 111 mmol/L   CO2 21 (L) 22 - 32 mmol/L   Glucose, Bld 172 (H) 65 - 99 mg/dL   BUN 38 (H) 6 - 20 mg/dL   Creatinine, Ser 2.33 (H) 0.61 - 1.24 mg/dL   Calcium 8.0 (L) 8.9 - 10.3 mg/dL   Total Protein 7.0 6.5 - 8.1 g/dL   Albumin 2.3 (L) 3.5 - 5.0 g/dL   AST 189 (H) 15 - 41 U/L   ALT 98 (H) 17 - 63 U/L   Alkaline Phosphatase 516 (H) 38 - 126 U/L   Total Bilirubin 1.2 0.3 - 1.2 mg/dL   GFR calc non Af Amer 23 (L) >60 mL/min    GFR calc Af Amer 27 (L) >60 mL/min    Comment: (NOTE) The eGFR has been calculated using the CKD EPI equation. This calculation has not been validated in all clinical situations. eGFR's persistently <60 mL/min signify possible Chronic Kidney Disease.    Anion gap 17 (H) 5 - 15    Comment: Performed at [facility], [place] [address], [place], [place] [address]  CBC WITH DIFFERENTIAL     Status: Abnormal   Collection Time: 09/04/17 11:53 PM  Result Value Ref Range   WBC 10.4 4.0 - 10.5 K/uL    Comment: ADJUSTED FOR NUCLEATED RBC'S   RBC 4.51 4.22 - 5.81 MIL/uL   Hemoglobin 12.7 (L) 13.0 - 17.0 g/dL   HCT 39.4 39.0 - 52.0 %   MCV 87.4 78.0 - 100.0 fL   MCH 28.2 26.0 - 34.0 pg   MCHC 32.2 30.0 - 36.0 g/dL   RDW 15.0 11.5 - 15.5 %   Platelets 170 150 - 400 K/uL   Neutrophils Relative % 87 %   Lymphocytes Relative 9 %   Monocytes Relative 4 %   Eosinophils Relative 0 %   Basophils Relative 0 %   Neutro Abs 9.1 (H) 1.7 - 7.7 K/uL   Lymphs Abs 0.9 0.7 - 4.0 K/uL   Monocytes Absolute 0.4 0.1 - 1.0 K/uL   Eosinophils Absolute 0.0 0.0 - 0.7 K/uL   Basophils Absolute 0.0 0.0 - 0.1 K/uL   RBC Morphology POLYCHROMASIA PRESENT    WBC Morphology ATYPICAL LYMPHOCYTES     Comment: Performed at [facility], [place] [address] [place],  [address]  Urinalysis, Routine w reflex microscopic     Status: Abnormal   Collection Time: 09/05/17 12:59 AM  Result Value Ref Range   Color, Urine YELLOW YELLOW   APPearance TURBID (A) CLEAR   Specific Gravity, Urine 1.013 1.005 - 1.030   pH 6.0 5.0 - 8.0   Glucose, UA 0 (A) NEGATIVE mg/dL   Hgb urine dipstick 3 (A) NEGATIVE   Bilirubin Urine 0 (A) NEGATIVE   Ketones, ur 0 (A) NEGATIVE mg/dL   Protein, ur 300 (A) NEGATIVE mg/dL   Nitrite 0 (A) NEGATIVE   Leukocytes, UA 2 (A) NEGATIVE   RBC / HPF TOO NUMEROUS  TO COUNT 0 - 5 RBC/hpf   WBC, UA TOO NUMEROUS TO COUNT 0 - 5 WBC/hpf   Bacteria, UA NONE SEEN NONE SEEN    Squamous Epithelial / LPF 6-30 (A) NONE SEEN   WBC Clumps PRESENT    Mucus PRESENT    Hyaline Casts, UA PRESENT    Non Squamous Epithelial 6-30 (A) NONE SEEN   Crystals PRESENT (A) NEGATIVE    Comment: Performed at [facility], [place] [address], [place], [place] [address]  I-Stat CG4 Lactic Acid, ED  (not at  [facility])     Status: Abnormal   Collection Time: 09/05/17  3:12 AM  Result Value Ref Range   Lactic Acid, Venous 3.55 (HH) 0.5 - 1.9 mmol/L   Comment NOTIFIED PHYSICIAN    Dg Chest Port 1 View  Result Date: 09/05/2017 CLINICAL DATA:  Acute onset of tachycardia.  Altered mental status. EXAM: PORTABLE CHEST 1 VIEW COMPARISON:  Chest radiograph performed 08/29/2017 FINDINGS: The lungs are well-aerated. Mild bibasilar atelectasis is noted. There is no evidence of pleural effusion or pneumothorax. The cardiomediastinal silhouette is within normal limits. No acute osseous abnormalities are seen. IMPRESSION: Mild bibasilar atelectasis noted.  Lungs otherwise clear. Electronically Signed   By: Garald Balding M.D.   On: 09/05/2017 00:57    Pending Labs Unresulted Labs (From admission, onward)   Start     Ordered   09/06/17 0500  Creatinine, serum  Daily,   R     09/05/17 0329   09/05/17 0600  Lactic acid, plasma  Now then every 3 hours,   STAT     09/05/17 0315   09/05/17 0500  Comprehensive metabolic panel  Tomorrow morning,   R     09/05/17 0313   09/05/17 0500  CBC WITH DIFFERENTIAL  Tomorrow morning,   R     09/05/17 0313   09/05/17 0500  Magnesium  Tomorrow morning,   R     09/05/17 0313   09/05/17 0137  Culture, Urine  Once,   R     09/05/17 0136   09/04/17 2328  Blood Culture (routine x 2)  BLOOD CULTURE X 2,   STAT     09/04/17 2328   Unscheduled  Protime-INR  With next blood draw,   R     09/05/17 0313   Unscheduled  APTT  With next blood draw,   R     09/05/17 0313      Vitals/Pain Today's Vitals   09/05/17 0300 09/05/17 0315 09/05/17 0330 09/05/17  0345  BP: (!) 150/84 (!) 147/84 (!) 164/75 (!) 163/69  Pulse: (!) 149 72 (!) 134 (!) 118  Resp: 17 (!) 26 (!) 28 (!) 26  Temp: 99.7 F (37.6 C) 99.7 F (37.6 C) 99.5 F (37.5 C) 99.3 F (37.4 C)  SpO2: 100% 100% 100% 100%    Isolation Precautions No active isolations  Medications Medications  artificial tears (LACRILUBE) ophthalmic ointment 1 application (not administered)  docusate sodium (COLACE) capsule 100 mg (not administered)  hydrocortisone (ANUSOL-HC) suppository 25 mg (not administered)  rosuvastatin (CRESTOR) tablet 20 mg (not administered)  ENSURE NUTRITION SHAKE LIQD 1 Can (not administered)  saccharomyces boulardii (FLORASTOR) capsule 250 mg (not administered)  insulin aspart (novoLOG) injection 0-9 Units (not administered)  heparin injection 5,000 Units (not administered)  sodium chloride flush (NS) 0.9 % injection 3 mL (not administered)  0.9 % NaCl with KCl 40 mEq / L  infusion (not administered)  acetaminophen (TYLENOL) tablet  650 mg (not administered)    Or  acetaminophen (TYLENOL) suppository 650 mg (not administered)  fentaNYL (SUBLIMAZE) injection 12.5-25 mcg (not administered)  senna-docusate (Senokot-S) tablet 1 tablet (not administered)  bisacodyl (DULCOLAX) suppository 10 mg (not administered)  magnesium citrate solution 1 Bottle (not administered)  ondansetron (ZOFRAN) tablet 4 mg (not administered)    Or  ondansetron (ZOFRAN) injection 4 mg (not administered)  magnesium sulfate IVPB 2 g 50 mL (not administered)  hydrALAZINE (APRESOLINE) injection 10 mg (not administered)  vancomycin (VANCOCIN) IVPB 1000 mg/200 mL premix (not administered)  piperacillin-tazobactam (ZOSYN) IVPB 3.375 g (not administered)  sodium chloride 0.9 % bolus 1,000 mL (0 mLs Intravenous Stopped 09/05/17 0148)    And  sodium chloride 0.9 % bolus 1,000 mL (0 mLs Intravenous Stopped 09/05/17 0123)    And  sodium chloride 0.9 % bolus 250 mL (0 mLs Intravenous Stopped 09/05/17  0300)  piperacillin-tazobactam (ZOSYN) IVPB 3.375 g (0 g Intravenous Stopped 09/05/17 0030)  vancomycin (VANCOCIN) IVPB 1000 mg/200 mL premix (0 mg Intravenous Stopped 09/05/17 0058)  acetaminophen (TYLENOL) suppository 650 mg (650 mg Rectal Given 09/04/17 2347)  potassium chloride 10 mEq in 100 mL IVPB (10 mEq Intravenous New Bag/Given 09/05/17 0154)    Mobility non-ambulatory

## 2017-09-05 NOTE — ED Notes (Signed)
ED TO INPATIENT HANDOFF REPORT  Name/Age/Gender [name] Cook 84 y.o. male  Code Status    Code Status Orders  (From admission, onward)        Start     Ordered   09/05/17 0308  Full code  Continuous     09/05/17 0313    Code Status History    Date Active Date Inactive Code Status Order ID Comments User Context   08/29/2017 23:51 09/03/2017 18:47 Full Code [id number]  Bennie Pierini, MD Inpatient   07/19/2017 16:32 07/20/2017 20:05 Full Code [id number]  Lucas Mallow, MD Inpatient   05/17/2017 17:14 05/20/2017 15:34 DNR [id number]  Tawni Millers, MD Inpatient   12/22/2016 10:46 03/04/2017 15:22 DNR [id number]  Lonn Georgia, PA-C Outpatient   12/13/2016 02:34 12/13/2016 15:46 Full Code [id number]  Ivor Costa, MD ED    Advance Directive Documentation     Most Recent Value  Type of Advance Directive  Healthcare Power of Attorney  Pre-existing out of facility DNR order (yellow form or pink MOST form)  No data  "MOST" Form in Place?  No data      Home/SNF/Other Skilled nursing facility  Chief Complaint ams  Level of Care/Admitting Diagnosis ED Disposition    ED Disposition Condition [facility] Area: [place] [[id number]]  Level of Care: Stepdown [14]  Admit to SDU based on following criteria: Severe physiological/psychological symptoms:  Any diagnosis requiring assessment & intervention at least every 4 hours on an ongoing basis to obtain desired patient outcomes including stability and rehabilitation  Diagnosis: Severe sepsis [facility]) [[id number]]  Admitting Physician: Vianne Bulls [[id number]]  Attending Physician: Vianne Bulls [[id number]]  Estimated length of stay: past midnight tomorrow  Certification:: I certify this patient will need inpatient services for at least 2 midnights  PT Class (Do Not Modify): Inpatient [101]  PT Acc Code (Do Not Modify): Private [1]       Medical History Past Medical History:  Diagnosis Date   . Bladder cancer ([place])   . Coronary artery disease   . Dementia    mild  . Diabetes mellitus without complication ([place])   . HOH (hard of hearing)    does not wear hearing aides, but can hear you if you speak loudly  . Hypertension   . Skin cancer 2002   on face  . STEMI involving right coronary artery ([place]) 2008   S/P 2.5 x 20 mm Taxus DES RCA, med rx for 30% LAD, 80% OM1 & dCFX, EF 60%    Allergies No Known Allergies  IV Location/Drains/Wounds Patient Lines/Drains/Airways Status   Active Line/Drains/Airways    Name:   Placement date:   Placement time:   Site:   Days:   Peripheral IV 09/04/17 Left Forearm   09/04/17    -    Forearm   1   Peripheral IV 09/04/17 Right Hand   09/04/17    2338    Hand   1   Peripheral IV 09/04/17 Right Forearm   09/04/17    2350    Forearm   1   Urethral Catheter Lanell Matar, RN Temperature probe 14 Fr.   09/05/17    0009    Temperature probe   less than 1          Labs/Imaging Results for orders placed or performed during the hospital encounter of 09/04/17 (from the past 48 hour(s))  I-Stat CG4 Lactic Acid, ED  (  not at  [facility])     Status: Abnormal   Collection Time: 09/04/17 11:47 PM  Result Value Ref Range   Lactic Acid, Venous 6.48 (HH) 0.5 - 1.9 mmol/L   Comment NOTIFIED PHYSICIAN   Comprehensive metabolic panel     Status: Abnormal   Collection Time: 09/04/17 11:53 PM  Result Value Ref Range   Sodium 140 135 - 145 mmol/L   Potassium 3.1 (L) 3.5 - 5.1 mmol/L    Comment: DELTA CHECK NOTED   Chloride 102 101 - 111 mmol/L   CO2 21 (L) 22 - 32 mmol/L   Glucose, Bld 172 (H) 65 - 99 mg/dL   BUN 38 (H) 6 - 20 mg/dL   Creatinine, Ser 2.33 (H) 0.61 - 1.24 mg/dL   Calcium 8.0 (L) 8.9 - 10.3 mg/dL   Total Protein 7.0 6.5 - 8.1 g/dL   Albumin 2.3 (L) 3.5 - 5.0 g/dL   AST 189 (H) 15 - 41 U/L   ALT 98 (H) 17 - 63 U/L   Alkaline Phosphatase 516 (H) 38 - 126 U/L   Total Bilirubin 1.2 0.3 - 1.2 mg/dL   GFR calc non Af Amer 23 (L) >60 mL/min    GFR calc Af Amer 27 (L) >60 mL/min    Comment: (NOTE) The eGFR has been calculated using the CKD EPI equation. This calculation has not been validated in all clinical situations. eGFR's persistently <60 mL/min signify possible Chronic Kidney Disease.    Anion gap 17 (H) 5 - 15    Comment: Performed at [facility], [place] [address], [place], [place] [address]  CBC WITH DIFFERENTIAL     Status: Abnormal   Collection Time: 09/04/17 11:53 PM  Result Value Ref Range   WBC 10.4 4.0 - 10.5 K/uL    Comment: ADJUSTED FOR NUCLEATED RBC'S   RBC 4.51 4.22 - 5.81 MIL/uL   Hemoglobin 12.7 (L) 13.0 - 17.0 g/dL   HCT 39.4 39.0 - 52.0 %   MCV 87.4 78.0 - 100.0 fL   MCH 28.2 26.0 - 34.0 pg   MCHC 32.2 30.0 - 36.0 g/dL   RDW 15.0 11.5 - 15.5 %   Platelets 170 150 - 400 K/uL   Neutrophils Relative % 87 %   Lymphocytes Relative 9 %   Monocytes Relative 4 %   Eosinophils Relative 0 %   Basophils Relative 0 %   Neutro Abs 9.1 (H) 1.7 - 7.7 K/uL   Lymphs Abs 0.9 0.7 - 4.0 K/uL   Monocytes Absolute 0.4 0.1 - 1.0 K/uL   Eosinophils Absolute 0.0 0.0 - 0.7 K/uL   Basophils Absolute 0.0 0.0 - 0.1 K/uL   RBC Morphology POLYCHROMASIA PRESENT    WBC Morphology ATYPICAL LYMPHOCYTES     Comment: Performed at [facility], [place] [address], [place], [place] [address]  Urinalysis, Routine w reflex microscopic     Status: Abnormal   Collection Time: 09/05/17 12:59 AM  Result Value Ref Range   Color, Urine YELLOW YELLOW   APPearance TURBID (A) CLEAR   Specific Gravity, Urine 1.013 1.005 - 1.030   pH 6.0 5.0 - 8.0   Glucose, UA 0 (A) NEGATIVE mg/dL   Hgb urine dipstick 3 (A) NEGATIVE   Bilirubin Urine 0 (A) NEGATIVE   Ketones, ur 0 (A) NEGATIVE mg/dL   Protein, ur 300 (A) NEGATIVE mg/dL   Nitrite 0 (A) NEGATIVE   Leukocytes, UA 2 (A) NEGATIVE   RBC / HPF TOO NUMEROUS  TO COUNT 0 - 5 RBC/hpf   WBC, UA TOO NUMEROUS TO COUNT 0 - 5 WBC/hpf   Bacteria, UA NONE SEEN NONE SEEN    Squamous Epithelial / LPF 6-30 (A) NONE SEEN   WBC Clumps PRESENT    Mucus PRESENT    Hyaline Casts, UA PRESENT    Non Squamous Epithelial 6-30 (A) NONE SEEN   Crystals PRESENT (A) NEGATIVE    Comment: Performed at [facility], [place] [address] [place], [place] [address]  I-Stat CG4 Lactic Acid, ED  (not at  [facility])     Status: Abnormal   Collection Time: 09/05/17  3:12 AM  Result Value Ref Range   Lactic Acid, Venous 3.55 (HH) 0.5 - 1.9 mmol/L   Comment NOTIFIED PHYSICIAN   Comprehensive metabolic panel     Status: Abnormal   Collection Time: 09/05/17  5:25 AM  Result Value Ref Range   Sodium 142 135 - 145 mmol/L   Potassium 4.5 3.5 - 5.1 mmol/L    Comment: MODERATE HEMOLYSIS   Chloride 108 101 - 111 mmol/L   CO2 24 22 - 32 mmol/L   Glucose, Bld 194 (H) 65 - 99 mg/dL   BUN 36 (H) 6 - 20 mg/dL   Creatinine, Ser 2.38 (H) 0.61 - 1.24 mg/dL   Calcium 7.2 (L) 8.9 - 10.3 mg/dL   Total Protein 6.3 (L) 6.5 - 8.1 g/dL   Albumin 2.0 (L) 3.5 - 5.0 g/dL   AST 703 (H) 15 - 41 U/L    Comment: MODERATE HEMOLYSIS   ALT 206 (H) 17 - 63 U/L    Comment: MODERATE HEMOLYSIS   Alkaline Phosphatase 517 (H) 38 - 126 U/L    Comment: MODERATE HEMOLYSIS   Total Bilirubin 1.7 (H) 0.3 - 1.2 mg/dL    Comment: MODERATE HEMOLYSIS   GFR calc non Af Amer 23 (L) >60 mL/min   GFR calc Af Amer 26 (L) >60 mL/min    Comment: (NOTE) The eGFR has been calculated using the CKD EPI equation. This calculation has not been validated in all clinical situations. eGFR's persistently <60 mL/min signify possible Chronic Kidney Disease.    Anion gap 10 5 - 15    Comment: Performed at [facility], [place] [address] [place], [place] [address]  CBC WITH DIFFERENTIAL     Status: Abnormal   Collection Time: 09/05/17  5:25 AM  Result Value Ref Range   WBC 4.8 4.0 - 10.5 K/uL   RBC 4.35 4.22 - 5.81 MIL/uL   Hemoglobin 12.1 (L) 13.0 - 17.0 g/dL   HCT 38.1 (L) 39.0 - 52.0 %   MCV 87.6  78.0 - 100.0 fL   MCH 27.8 26.0 - 34.0 pg   MCHC 31.8 30.0 - 36.0 g/dL   RDW 15.0 11.5 - 15.5 %   Platelets 131 (L) 150 - 400 K/uL   Neutrophils Relative % 75 %   Lymphocytes Relative 17 %   Monocytes Relative 7 %   Eosinophils Relative 0 %   Basophils Relative 1 %   Neutro Abs 3.7 1.7 - 7.7 K/uL   Lymphs Abs 0.8 0.7 - 4.0 K/uL   Monocytes Absolute 0.3 0.1 - 1.0 K/uL   Eosinophils Absolute 0.0 0.0 - 0.7 K/uL   Basophils Absolute 0.0 0.0 - 0.1 K/uL   RBC Morphology POLYCHROMASIA PRESENT    WBC Morphology ATYPICAL LYMPHOCYTES     Comment: Performed at [facility], [place] [address] [place], [place]  [address]  Magnesium     Status: None   Collection Time: 09/05/17  5:25 AM  Result Value Ref Range   Magnesium 2.0 1.7 - 2.4 mg/dL    Comment: Performed at [facility], [place] [address], [place], [place] [address]  Lactic acid, plasma     Status: Abnormal   Collection Time: 09/05/17  5:25 AM  Result Value Ref Range   Lactic Acid, Venous 3.1 (HH) 0.5 - 1.9 mmol/L    Comment: CRITICAL RESULT CALLED TO, READ BACK BY AND VERIFIED WITH: B JESSE,RN @0658  09/05/17 MKELLY  Performed at [facility], [place] [address], [place], [place] [address]   CBG monitoring, ED     Status: Abnormal   Collection Time: 09/05/17  5:37 AM  Result Value Ref Range   Glucose-Capillary 177 (H) 65 - 99 mg/dL  CBG monitoring, ED     Status: Abnormal   Collection Time: 09/05/17  9:06 AM  Result Value Ref Range   Glucose-Capillary 173 (H) 65 - 99 mg/dL  CBG monitoring, ED     Status: Abnormal   Collection Time: 09/05/17 12:16 PM  Result Value Ref Range   Glucose-Capillary 119 (H) 65 - 99 mg/dL  I-Stat CG4 Lactic Acid, ED     Status: Abnormal   Collection Time: 09/05/17  1:43 PM  Result Value Ref Range   Lactic Acid, Venous 3.57 (HH) 0.5 - 1.9 mmol/L   Comment NOTIFIED PHYSICIAN    US Renal  Result Date: 09/05/2017 CLINICAL DATA:  84 y/o M; history of left  hydronephrosis and bladder mass. EXAM: RENAL / URINARY TRACT ULTRASOUND COMPLETE COMPARISON:  08/30/2017 renal ultrasound. 08/29/2017 CT abdomen and pelvis. FINDINGS: Right Kidney: Length: 11.0 cm. Echogenicity within normal limits. No mass or hydronephrosis visualized. Left Kidney: Length: 9.7 cm. Measurements in oblique plane, likely under sample. Stable moderate hydronephrosis. Bladder: Stable ill-defined mass within the wall of the bladder demonstrating blood flow on color Doppler. IMPRESSION: 1. Stable left moderate hydronephrosis. 2. Stable ill-defined mass within the wall of the bladder. Electronically Signed   By: Kristine Garbe M.D.   On: 09/05/2017 05:09   Dg Chest Port 1 View  Result Date: 09/05/2017 CLINICAL DATA:  Acute onset of tachycardia.  Altered mental status. EXAM: PORTABLE CHEST 1 VIEW COMPARISON:  Chest radiograph performed 08/29/2017 FINDINGS: The lungs are well-aerated. Mild bibasilar atelectasis is noted. There is no evidence of pleural effusion or pneumothorax. The cardiomediastinal silhouette is within normal limits. No acute osseous abnormalities are seen. IMPRESSION: Mild bibasilar atelectasis noted.  Lungs otherwise clear. Electronically Signed   By: Garald Balding M.D.   On: 09/05/2017 00:57    Pending Labs Unresulted Labs (From admission, onward)   Start     Ordered   09/06/17 0500  Creatinine, serum  Daily,   R     09/05/17 0329   09/05/17 0600  Lactic acid, plasma  Now then every 3 hours,   STAT     09/05/17 0315   09/05/17 0137  Culture, Urine  Once,   R     09/05/17 0136   09/04/17 2328  Blood Culture (routine x 2)  BLOOD CULTURE X 2,   STAT     09/04/17 2328   Unscheduled  Protime-INR  With next blood draw,   R     09/05/17 0313   Unscheduled  APTT  With next blood draw,   R     09/05/17 0313      Vitals/Pain  Today's Vitals   09/05/17 1136 09/05/17 1200 09/05/17 1210 09/05/17 1300  BP: (!) 155/70 (!) 150/78 (!) 150/78 (!) 153/136  Pulse: (!)  104  (!) 110   Resp: (!) 22 (!) 25 (!) 22 (!) 27  Temp:  98.6 F (37 C)  98.4 F (36.9 C)  SpO2: 98% 98% 98% 99%    Isolation Precautions No active isolations  Medications Medications  artificial tears (LACRILUBE) ophthalmic ointment 1 application (not administered)  docusate sodium (COLACE) capsule 100 mg (not administered)  hydrocortisone (ANUSOL-HC) suppository 25 mg (not administered)  rosuvastatin (CRESTOR) tablet 20 mg (not administered)  saccharomyces boulardii (FLORASTOR) capsule 250 mg (not administered)  insulin aspart (novoLOG) injection 0-9 Units (0 Units Subcutaneous Not Given 09/05/17 1222)  heparin injection 5,000 Units (5,000 Units Subcutaneous Not Given 09/05/17 0916)  sodium chloride flush (NS) 0.9 % injection 3 mL (3 mLs Intravenous Given 09/05/17 0759)  0.9 % NaCl with KCl 40 mEq / L  infusion (100 mL/hr Intravenous New Bag/Given 09/05/17 0759)  acetaminophen (TYLENOL) tablet 650 mg (not administered)    Or  acetaminophen (TYLENOL) suppository 650 mg (not administered)  fentaNYL (SUBLIMAZE) injection 12.5-25 mcg (25 mcg Intravenous Given 09/05/17 1344)  senna-docusate (Senokot-S) tablet 1 tablet (not administered)  bisacodyl (DULCOLAX) suppository 10 mg (not administered)  magnesium citrate solution 1 Bottle (not administered)  ondansetron (ZOFRAN) tablet 4 mg (not administered)    Or  ondansetron (ZOFRAN) injection 4 mg (not administered)  hydrALAZINE (APRESOLINE) injection 10 mg (not administered)  vancomycin (VANCOCIN) IVPB 1000 mg/200 mL premix (not administered)  piperacillin-tazobactam (ZOSYN) IVPB 3.375 g (0 g Intravenous Stopped 09/05/17 1223)  feeding supplement (ENSURE ENLIVE) (ENSURE ENLIVE) liquid 237 mL (not administered)  oxyCODONE (Oxy IR/ROXICODONE) immediate release tablet 5 mg (not administered)  sodium chloride 0.9 % bolus 1,000 mL (0 mLs Intravenous Stopped 09/05/17 0148)    And  sodium chloride 0.9 % bolus 1,000 mL (0 mLs Intravenous Stopped  09/05/17 0123)    And  sodium chloride 0.9 % bolus 250 mL (0 mLs Intravenous Stopped 09/05/17 0300)  piperacillin-tazobactam (ZOSYN) IVPB 3.375 g (0 g Intravenous Stopped 09/05/17 0030)  vancomycin (VANCOCIN) IVPB 1000 mg/200 mL premix (0 mg Intravenous Stopped 09/05/17 0058)  acetaminophen (TYLENOL) suppository 650 mg (650 mg Rectal Given 09/04/17 2347)  potassium chloride 10 mEq in 100 mL IVPB (0 mEq Intravenous Stopped 09/05/17 0356)  magnesium sulfate IVPB 2 g 50 mL (0 g Intravenous Stopped 09/05/17 0724)    Mobility non-ambulatory

## 2017-09-05 NOTE — H&P (Addendum)
History and Physical    [name] Cook MWN:[id number] DOB: August 17, 1926 DOA: 09/04/2017  PCP: System, Pcp Not In   Patient coming from: SNF  Chief Complaint: Lethargy, loss of appetite   HPI: [name] Cook is a 84 y.o. male with medical history significant for type 2 diabetes mellitus, hypertension, coronary artery disease, dementia, and recent diagnosis of bladder cancer with metastases, now presenting to the emergency department from his SNF for evaluation of lethargy.  Patient was discharged from the hospital 1 day earlier after management of urosepsis with urine cultures growing MSSA.  He had been treated with Zosyn in the hospital and transition to Augmentin at time of discharge.  He was in stable and much improved condition at time of hospital discharge, but was noted by SNF personnel to be increasingly lethargic, not eating or drinking, and not as interactive over the past day.  His condition continued to worsen and he was transported to the ED for further evaluation.  ED Course: Upon arrival to the ED, patient is found to be febrile to 38.9 C, tachycardic to 150, tachypneic, and with blood pressure 101/59.  EKG features a tachyarrhythmia with rate 152 and RBBB.  Chest x-ray is notable for mild bibasilar atelectasis, but otherwise clear.  Chemistry panel is notable for potassium 3.1, alkaline phosphatase 516, AST 189, ALT 98, and creatinine of 2.33, up from 1.5 the day prior.  CBC is unremarkable.  Lactic acid is elevated to 6.48.  Blood cultures were collected, 30 cc/kg NS given, and the patient was started on empiric vancomycin and Zosyn.  Heart rate and blood pressure improved with the IV fluids and the patient will be admitted to the stepdown unit for ongoing evaluation and management of severe sepsis, suspected secondary to UTI.  Review of Systems:  All other systems reviewed and apart from HPI, are negative.  Past Medical History:  Diagnosis Date  . Bladder cancer ([place])   . Coronary  artery disease   . Dementia    mild  . Diabetes mellitus without complication ([place])   . HOH (hard of hearing)    does not wear hearing aides, but can hear you if you speak loudly  . Hypertension   . Skin cancer 2002   on face  . STEMI involving right coronary artery ([place]) 2008   S/P 2.5 x 20 mm Taxus DES RCA, med rx for 30% LAD, 80% OM1 & dCFX, EF 60%    Past Surgical History:  Procedure Laterality Date  . APPENDECTOMY    . CATARACT EXTRACTION W/PHACO Left 06/10/2016   Procedure: CATARACT EXTRACTION PHACO AND INTRAOCULAR LENS PLACEMENT (IOC);  Surgeon: Estill Cotta, MD;  Location: [facility] ORS;  Service: Ophthalmology;  Laterality: Left;  Lot # [id number] H [place]: 01:04.0 AP% 25.5 CDE: 29.46  . CHOLECYSTECTOMY    . CORONARY ANGIOPLASTY WITH STENT PLACEMENT  2008   2.5 x 20 mm Taxus DES to 95% RCA, 80% dCFX, 80% OM1, 30% LAD > med rx, EF 60%  . EYE SURGERY    . HERNIA REPAIR     bilateral inguinal hernia  . PROSTATE SURGERY    . TRANSURETHRAL RESECTION OF BLADDER TUMOR N/A 07/19/2017   Procedure: TRANSURETHRAL RESECTION OF BLADDER TUMOR (TURBT);  Surgeon: Lucas Mallow, MD;  Location: [facility] ORS;  Service: Urology;  Laterality: N/A;     reports that he quit smoking about 49 years ago. His smoking use included cigarettes. He has a 10.00 pack-year smoking history. he has never used  smokeless tobacco. He reports that he does not drink alcohol or use drugs.  No Known Allergies  Family History  Problem Relation Age of Onset  . Cancer Neg Hx      Prior to Admission medications   Medication Sig Start Date End Date Taking? Authorizing Provider  acetaminophen (TYLENOL) 500 MG tablet Take 500 mg by mouth every 6 (six) hours as needed for moderate pain.   Yes [provider]  amLODipine (NORVASC) 5 MG tablet Take 1 tablet (5 mg total) by mouth daily. 09/03/17  Yes Bonnielee Haff, MD  amoxicillin-clavulanate (AUGMENTIN) 875-125 MG tablet Take 1 tablet by mouth every 12 (twelve)  hours for 10 days. 09/03/17 09/13/17 Yes Bonnielee Haff, MD  artificial tears (LACRILUBE) OINT ophthalmic ointment Place 1 application into both eyes at bedtime.    Yes [provider]  docusate sodium (COLACE) 100 MG capsule Take 100 mg by mouth 2 (two) times daily.   Yes [provider]  hydrocortisone (ANUSOL-HC) 25 MG suppository Place 1 suppository (25 mg total) rectally 2 (two) times daily. 09/02/17  Yes Bonnielee Haff, MD  Magnesium Hydroxide (MILK OF MAGNESIA CONCENTRATE PO) Take 30 mLs by mouth daily as needed (for constipation).   Yes [provider]  metoprolol tartrate (LOPRESSOR) 50 MG tablet Take 1 tablet (50 mg total) by mouth 2 (two) times daily. 12/22/16  Yes Barrett, Evelene Croon, PA-C  Multiple Vitamins-Minerals (PRESERVISION AREDS PO) Take 1 tablet by mouth 2 (two) times daily.    Yes [provider]  nitroGLYCERIN (NITROSTAT) 0.4 MG SL tablet Place 0.4 mg under the tongue every 5 (five) minutes as needed for chest pain.   Yes [provider]  Nutritional Supplements (ENSURE NUTRITION SHAKE) LIQD Take 1 Can by mouth 3 (three) times daily between meals. 09/02/17  Yes Bonnielee Haff, MD  oxyCODONE (OXY IR/ROXICODONE) 5 MG immediate release tablet Take 0.5 tablets (2.5 mg total) by mouth every 6 (six) hours as needed for moderate pain. 09/02/17  Yes Bonnielee Haff, MD  polyethylene glycol [facility] / GLYCOLAX) packet Take 17 g by mouth daily.   Yes [provider]  potassium chloride SA (K-DUR,KLOR-CON) 20 MEQ tablet Take 1 tablet (20 mEq total) by mouth daily. 09/03/17  Yes Bonnielee Haff, MD  rosuvastatin (CRESTOR) 20 MG tablet Take 20 mg by mouth daily.    Yes [provider]  saccharomyces boulardii (FLORASTOR) 250 MG capsule Take 1 capsule (250 mg total) by mouth 2 (two) times daily. 09/03/17  Yes Bonnielee Haff, MD  senna (SENOKOT) 8.6 MG TABS tablet Take 1 tablet (8.6 mg total) by mouth daily as needed for mild constipation.  05/20/17  Yes Domenic Polite, MD    Physical Exam: Vitals:   09/05/17 0115 09/05/17 0130 09/05/17 0145 09/05/17 0300  BP: (!) 162/86 (!) 156/86 (!) 157/73 (!) 150/84  Pulse: (!) 43  (!) 41 (!) 149  Resp: (!) 31 (!) 25 (!) 25 17  Temp: (!) 100.4 F (38 C) 99.9 F (37.7 C) 99.7 F (37.6 C) 99.7 F (37.6 C)  SpO2: 100%  100% 100%      Constitutional: Tachypneic, lethargic  Eyes: PERTLA, lids and conjunctivae normal ENMT: Mucous membranes are dry. Posterior pharynx clear of any exudate or lesions.   Neck: normal, supple, no masses, no thyromegaly Respiratory: clear to auscultation bilaterally, no wheezing, no crackles. Tachypneic.  Cardiovascular: Rate ~120 and regular. No extremity edema.  Abdomen: No distension, soft. Bowel sounds active.  Musculoskeletal: no clubbing / cyanosis. No  joint deformity upper and lower extremities.   Skin: no significant rashes, lesions, ulcers. Poor turgor. Neurologic: No facial asymmetry. Sensation to light touch intact. Moving all extremities. Answering yes/no questions only.     Labs on Admission: I have personally reviewed following labs and imaging studies  CBC: Recent Labs  Lab 08/29/17 1653 08/30/17 0409 08/31/17 0421 09/02/17 0339 09/04/17 2353  WBC 14.4* 12.8* 9.3 14.4* 10.4  NEUTROABS 13.0*  --   --   --  9.1*  HGB 12.3* 12.0* 11.8* 13.5 12.7*  HCT 37.6* 36.5* 36.3* 41.5 39.4  MCV 88.1 87.1 87.7 87.9 87.4  PLT 369 331 313 239 314   Basic Metabolic Panel: Recent Labs  Lab 08/29/17 1653 08/30/17 0409 08/31/17 0421 09/01/17 0354 09/02/17 0339 09/02/17 1247 09/02/17 1802 09/03/17 0354 09/04/17 2353  NA 132* 133* 136  --  139  --   --  139 140  K 4.8 2.8* 3.3*  --  2.7* 2.8* 4.6 4.0 3.1*  CL 87* 91* 97*  --  95*  --   --  100* 102  CO2 30 29 24   --  27  --   --  26 21*  GLUCOSE 151* 76 84  --  152*  --   --  165* 172*  BUN 30* 28* 28*  --  28*  --   --  28* 38*  CREATININE 1.77* 1.55* 1.57*  1.45* 1.58* 1.79*  --    --  1.53* 2.33*  CALCIUM 8.8* 8.3* 7.9*  --  8.2*  --   --  7.9* 8.0*  MG 2.1 2.0 1.9 2.4  --  2.1  --  2.0  --   PHOS 3.9 3.1 2.6 2.5  --   --   --   --   --    GFR: Estimated Creatinine Clearance: 22.5 mL/min (A) (by C-G formula based on SCr of 2.33 mg/dL (H)). Liver Function Tests: Recent Labs  Lab 08/29/17 1653 08/30/17 0409 09/04/17 2353  AST 72* 61* 189*  ALT 53 49 98*  ALKPHOS 455* 400* 516*  BILITOT 1.3* 1.2 1.2  PROT 8.1 7.8 7.0  ALBUMIN 2.8* 2.6* 2.3*   No results for input(s): LIPASE, AMYLASE in the last 168 hours. Recent Labs  Lab 08/30/17 0024  AMMONIA 22   Coagulation Profile: No results for input(s): INR, PROTIME in the last 168 hours. Cardiac Enzymes: No results for input(s): CKTOTAL, CKMB, CKMBINDEX, TROPONINI in the last 168 hours. BNP (last 3 results) No results for input(s): PROBNP in the last 8760 hours. HbA1C: No results for input(s): HGBA1C in the last 72 hours. CBG: Recent Labs  Lab 09/02/17 2041 09/03/17 0034 09/03/17 0429 09/03/17 0745 09/03/17 1249  GLUCAP 102* 156* 173* 76 154*   Lipid Profile: No results for input(s): CHOL, HDL, LDLCALC, TRIG, CHOLHDL, LDLDIRECT in the last 72 hours. Thyroid Function Tests: No results for input(s): TSH, T4TOTAL, FREET4, T3FREE, THYROIDAB in the last 72 hours. Anemia Panel: No results for input(s): VITAMINB12, FOLATE, FERRITIN, TIBC, IRON, RETICCTPCT in the last 72 hours. Urine analysis:    Component Value Date/Time   COLORURINE YELLOW 08/29/2017 1653   APPEARANCEUR TURBID (A) 08/29/2017 1653   LABSPEC 1.016 08/29/2017 1653   PHURINE 6.0 08/29/2017 1653   GLUCOSEU NEGATIVE 08/29/2017 1653   HGBUR MODERATE (A) 08/29/2017 1653   BILIRUBINUR NEGATIVE 08/29/2017 1653   KETONESUR NEGATIVE 08/29/2017 1653   PROTEINUR 100 (A) 08/29/2017 1653   NITRITE NEGATIVE 08/29/2017 1653   LEUKOCYTESUR LARGE (A)  08/29/2017 1653   Sepsis Labs: @LABRCNTIP (procalcitonin:4,lacticidven:4) ) Recent Results (from  the past 240 hour(s))  Blood Culture (routine x 2)     Status: None   Collection Time: 08/29/17  4:53 PM  Result Value Ref Range Status   Specimen Description   Final    RIGHT ANTECUBITAL Performed at [facility] [address] [place], [place] [address]    Special Requests   Final    BOTTLES DRAWN AEROBIC AND ANAEROBIC Blood Culture adequate volume Performed at [facility] [address], [place], [place] [address]    Culture   Final    NO GROWTH 5 DAYS Performed at [facility], [place] [address] [place], [place] [address]    Report Status 09/04/2017 FINAL  Final  Urine culture     Status: Abnormal   Collection Time: 08/29/17  4:53 PM  Result Value Ref Range Status   Specimen Description   Final    URINE, CLEAN CATCH Performed at [facility], [place] [address], [place], [place] [address]    Special Requests   Final    NONE Performed at [facility], [place] [address], [place], [place] [address]    Culture >=100,000 COLONIES/mL STAPHYLOCOCCUS AUREUS (A)  Final   Report Status 09/01/2017 FINAL  Final   Organism ID, Bacteria STAPHYLOCOCCUS AUREUS (A)  Final      Susceptibility   Staphylococcus aureus - MIC*    CIPROFLOXACIN <=0.5 SENSITIVE Sensitive     GENTAMICIN <=0.5 SENSITIVE Sensitive     NITROFURANTOIN <=16 SENSITIVE Sensitive     OXACILLIN 0.5 SENSITIVE Sensitive     TETRACYCLINE <=1 SENSITIVE Sensitive     VANCOMYCIN <=0.5 SENSITIVE Sensitive     TRIMETH/SULFA <=10 SENSITIVE Sensitive     CLINDAMYCIN <=0.25 SENSITIVE Sensitive     RIFAMPIN <=0.5 SENSITIVE Sensitive     Inducible Clindamycin NEGATIVE Sensitive     * >=100,000 COLONIES/mL STAPHYLOCOCCUS AUREUS  Blood Culture (routine x 2)     Status: None   Collection Time: 08/29/17  4:58 PM  Result Value Ref Range Status   Specimen Description   Final    LEFT ANTECUBITAL Performed at [facility] [address] [place], [place] [address]    Special Requests   Final    BOTTLES DRAWN AEROBIC AND ANAEROBIC Blood Culture adequate volume Performed at [facility] [address] [place], [place] [address]    Culture   Final    NO GROWTH 5 DAYS Performed at [facility], [place] [address], [place], [place] [address]    Report Status 09/04/2017 FINAL  Final  MRSA PCR Screening     Status: None   Collection Time: 08/30/17 12:47 AM  Result Value Ref Range Status   MRSA by PCR NEGATIVE NEGATIVE Final    Comment:        The GeneXpert MRSA Assay (FDA approved for NASAL specimens only), is one component of a comprehensive MRSA colonization surveillance program. It is not intended to diagnose MRSA infection nor to guide or monitor treatment for MRSA infections. Performed at [facility], [place] [address] [place],  [address]      Radiological Exams on Admission: Dg Chest Port 1 View  Result Date: 09/05/2017 CLINICAL DATA:  Acute onset of tachycardia.  Altered mental status. EXAM: PORTABLE CHEST 1 VIEW COMPARISON:  Chest radiograph performed 08/29/2017 FINDINGS: The lungs are well-aerated. Mild bibasilar atelectasis  is noted. There is no evidence of pleural effusion or pneumothorax. The cardiomediastinal silhouette is within normal limits. No acute osseous abnormalities are seen. IMPRESSION: Mild bibasilar atelectasis noted.  Lungs otherwise clear. Electronically Signed   By: Garald Balding M.D.   On: 09/05/2017 00:57    EKG: Independently reviewed. Tachyarrhythmia (rate 152), RBBB.   Assessment/Plan  1. Severe sepsis  - Presents from SNF with lethargy, loss of appetite, and decreased interaction - Found to be febrile and tachycardic with lactate 6.48 and AKI  - CXR is clear, UA pending  - Blood cultures collected in ED, will add urine culture  - Treated in ED with 30 cc/kg NS, vancomycin, and Zosyn  - Continue empiric broad-spectrum abx,  follow-up UA, trend lactate, continue IVF hydration, prn APAP    2. Acute encephalopathy  - Patient has history of dementia but reportedly converses and feeds self at baseline  - Noted by SNF staff to be very somnolent on day of presentation, not communicating much at all and not eating or drinking  - No focal deficit identified, suspect a toxic-metabolic etiology in setting of sepsis  - Treat sepsis as above, continue supportive care   3. Acute kidney injury superimposed on CKD stage III  - SCr is 2.33 on admission, up from 1.53 one day earlier  - Likely prerenal in setting of sepsis with AMS and loss of appetite; ATN possible  - Given sepsis with suspected urinary source, will check renal US for obstructive etiology   - Renally-dose medications, avoid nephrotoxins, repeat chem panel in am    4. CAD  - Continue Crestor, hold beta-blocker initially in setting of severe sepsis    5. Hx of hypertension  - BP at goal  - Hold antihypertensives initially in setting of severe sepsis    6. Type II DM  - Has been diet-controlled  - Follow CBG's and start SSI with Novolog while in hospital   7. Bladder cancer  - Underwent partial resection in January 2019 and has been undergoing radiation; oncology advised against palliative chemo and recommended hospice  - Known sacral and adrenal mets, suspected liver mets  - Family expresses understanding of prognosis    8. Hypokalemia  - Serum potassium is 3.1 on admission with sinus arrhythmia noted in ED  - Not appropriate for oral replacement at this time  - Add KCl to IVF, continue cardiac monitoring, repeat chem panel in am    9. Elevated LFT's - Alkaline phosphatase and transaminases elevated on admission  - Recently elevated in setting of sepsis and was improving, now back up  - Likely secondary to severe sepsis +/- suspected liver metastases    DVT prophylaxis: sq heparin  Code Status: Full  Family Communication: Daughter and  granddaughter updated at bedside  Consults called: None Admission status: Inpatient    Vianne Bulls, MD [facility] Pager [phone]  If 7PM-7AM, please contact night-coverage [email or web address] Password [email or web address]  09/05/2017, 3:14 AM

## 2017-09-05 NOTE — Progress Notes (Addendum)
Palliative:  Full note to follow. Extensive conversation with family today with below decisions:  - DNR - Continue current therapies with IVF, antibiotics, Cardizem infusion with hopes of some improvement to give him quality time with his family with goal to transition to hospice facility upon discharge - Do not escalate care further (no feeding tube, vasopressors, BiPAP, etc) - Comfort care if further decline in hospital - Comfort is priority in care - Given decline and poor prognosis family agrees to forego his last two radiation treatments in light of comfort focused care (radiation has been very hard on him)   Vinie Sill, NP Palliative Medicine Team Pager # [phone] (M-F 8a-5p) Team Phone # [phone] (Nights/Weekends)

## 2017-09-05 NOTE — ED Notes (Signed)
Dr.Opyd at bedside to evaluate pt.  

## 2017-09-05 NOTE — Progress Notes (Signed)
Patient is a 84 year old male with past medical history of type 2 diabetes, hypertension, coronary artery disease, dementia, recent diagnosis of bladder cancer with metastasis who presented from the skilled nursing facility to the emergency department with complaints of lethargy and altered mental status.  Patient was just discharged from here after treatment of urosepsis. Patient was admitted overnight for the management of sepsis secondary to UTI again.  Patient was found to be febrile, tachycardic and tachypneic on presentation.  Found to have elevated lactic acid level.  Patient has been started on broad-spectrum antibiotics.  Started on IV fluids. Patient seen and examined the bedside this morning.  His mental status looks to have improved.  He was alert and was not in obvious distress.  Patient was also found to have acute kidney injury on CKD.  We will continue to monitor the patient.  He will be moved to stepdown unit.

## 2017-09-05 NOTE — ED Notes (Signed)
Pt switched from non-rebreather to 2L nasal cannula

## 2017-09-05 NOTE — ED Notes (Signed)
POC lactic was given to MD and RN.

## 2017-09-06 ENCOUNTER — Ambulatory Visit: Payer: Medicare Other

## 2017-09-06 DIAGNOSIS — Z7189 Other specified counseling: Secondary | ICD-10-CM

## 2017-09-06 DIAGNOSIS — Z515 Encounter for palliative care: Secondary | ICD-10-CM

## 2017-09-06 LAB — GLUCOSE, CAPILLARY
GLUCOSE-CAPILLARY: 127 mg/dL — AB (ref 65–99)
GLUCOSE-CAPILLARY: 133 mg/dL — AB (ref 65–99)
GLUCOSE-CAPILLARY: 65 mg/dL (ref 65–99)
Glucose-Capillary: 108 mg/dL — ABNORMAL HIGH (ref 65–99)
Glucose-Capillary: 91 mg/dL (ref 65–99)
Glucose-Capillary: 55 mg/dL — ABNORMAL LOW (ref 65–99)
Glucose-Capillary: 62 mg/dL — ABNORMAL LOW (ref 65–99)

## 2017-09-06 LAB — COMPREHENSIVE METABOLIC PANEL
ALT: 189 U/L — AB (ref 17–63)
AST: 255 U/L — ABNORMAL HIGH (ref 15–41)
Albumin: 1.8 g/dL — ABNORMAL LOW (ref 3.5–5.0)
Alkaline Phosphatase: 516 U/L — ABNORMAL HIGH (ref 38–126)
Anion gap: 10 (ref 5–15)
BUN: 44 mg/dL — ABNORMAL HIGH (ref 6–20)
CHLORIDE: 115 mmol/L — AB (ref 101–111)
CO2: 24 mmol/L (ref 22–32)
CREATININE: 2.39 mg/dL — AB (ref 0.61–1.24)
Calcium: 7.5 mg/dL — ABNORMAL LOW (ref 8.9–10.3)
GFR calc Af Amer: 26 mL/min — ABNORMAL LOW (ref >60)
GFR, EST NON AFRICAN AMERICAN: 23 mL/min — AB (ref >60)
Glucose, Bld: 118 mg/dL — ABNORMAL HIGH (ref 65–99)
POTASSIUM: 3.1 mmol/L — AB (ref 3.5–5.1)
SODIUM: 149 mmol/L — AB (ref 135–145)
Total Bilirubin: 1.2 mg/dL (ref 0.3–1.2)
Total Protein: 6.2 g/dL — ABNORMAL LOW (ref 6.5–8.1)

## 2017-09-06 LAB — URINE CULTURE

## 2017-09-06 LAB — LACTIC ACID, PLASMA
LACTIC ACID, VENOUS: 2.7 mmol/L — AB (ref 0.5–1.9)
LACTIC ACID, VENOUS: 3.2 mmol/L — AB (ref 0.5–1.9)

## 2017-09-06 LAB — PROTIME-INR
INR: 1.72
PROTHROMBIN TIME: 20.1 s — AB (ref 11.4–15.2)

## 2017-09-06 LAB — APTT: aPTT: 43 seconds — ABNORMAL HIGH (ref 24–36)

## 2017-09-06 MED ORDER — DEXTROSE 50 % IV SOLN
INTRAVENOUS | Status: AC
  Filled 2017-09-06: qty 50

## 2017-09-06 MED ORDER — LORAZEPAM 2 MG/ML IJ SOLN: 0.2500 mg | INTRAMUSCULAR | Status: DC | PRN

## 2017-09-06 MED ORDER — POTASSIUM CHLORIDE CRYS ER 20 MEQ PO TBCR
20.0000 meq | EXTENDED_RELEASE_TABLET | Freq: Once | ORAL | Status: AC
  Administered 2017-09-06: 20 meq via ORAL
  Filled 2017-09-06: qty 1

## 2017-09-06 MED ORDER — SODIUM CHLORIDE 0.45 % IV SOLN
INTRAVENOUS | Status: DC
  Administered 2017-09-06: 10:00:00 via INTRAVENOUS

## 2017-09-06 MED ORDER — POTASSIUM CHLORIDE 10 MEQ/100ML IV SOLN
10.0000 meq | INTRAVENOUS | Status: AC
  Administered 2017-09-06 (×4): 10 meq via INTRAVENOUS
  Filled 2017-09-06 (×2): qty 100

## 2017-09-06 MED ORDER — DEXTROSE 5 % IV SOLN
INTRAVENOUS | Status: DC
  Administered 2017-09-06: 1000 mL via INTRAVENOUS

## 2017-09-06 MED ORDER — PIPERACILLIN-TAZOBACTAM IN DEX 2-0.25 GM/50ML IV SOLN
2.2500 g | Freq: Three times a day (TID) | INTRAVENOUS | Status: DC
  Administered 2017-09-06 – 2017-09-08 (×7): 2.25 g via INTRAVENOUS
  Filled 2017-09-06 (×9): qty 50

## 2017-09-06 MED ORDER — GLUCAGON HCL RDNA (DIAGNOSTIC) 1 MG IJ SOLR
INTRAMUSCULAR | Status: AC
  Administered 2017-09-06: 1 mg
  Filled 2017-09-06: qty 1

## 2017-09-06 MED ORDER — VANCOMYCIN HCL IN DEXTROSE 750-5 MG/150ML-% IV SOLN
750.0000 mg | INTRAVENOUS | Status: DC
  Administered 2017-09-07: 750 mg via INTRAVENOUS
  Filled 2017-09-06: qty 150

## 2017-09-06 MED ORDER — SODIUM CHLORIDE 0.9 % IV BOLUS (SEPSIS)
500.0000 mL | Freq: Once | INTRAVENOUS | Status: AC
  Administered 2017-09-06: 500 mL via INTRAVENOUS

## 2017-09-06 NOTE — Progress Notes (Signed)
Dr. Tawanna Solo texted and informed CBG-55 and glucagon 1 mg given IV. Cbg 65. Orders were given to change IV fluids to D5 @ 75. O2 @5L  intact. Family at bedside. Pt answers questions appropriately. Dr. Tawanna Solo also informed of Lactic Acid-3.2 Pt repositioned to Right side with pillow between legs, Foam dressing changed to sacrum after being soiled. Foam dressing applied to Rt. Upper back, 3 cm scratch.

## 2017-09-06 NOTE — Progress Notes (Signed)
Pharmacy Antibiotic Note  [name] Cook is a 84 y.o. male with bladder cancer recently treated for sepsis secondary to acute pyelonephritis and discharged from [facility] on 09/03/17.  Ucx on 3/3 showed MSSA.  During this hospitalization, he was treated with vanc, zosyn and then transitioned over to augmentin.  He presented back to the ED on 09/04/2017 with c/o AMS. To start zosyn and vancomycin for sepsis.  Today, 09/06/2017 Day #2 Vancomycin/Zosyn SCr up 2.39, CrCl 18 ml/min   Plan: - Adjust Zosyn to 2.25g IV q8h - Decrease Vancomycin to 750mg  IV q48h - next dose 3/12 at 10P - Check levels at steady state if continues, goal AUC 400-500 - Daily scr  ____________________________________  Height: 6\' 1"  (185.4 cm) Weight: 132 lb 7.9 oz (60.1 kg) IBW/kg (Calculated) : 79.9  Temp (24hrs), Avg:98.2 F (36.8 C), Min:97.5 F (36.4 C), Max:99 F (37.2 C)  Recent Labs  Lab 08/31/17 0421  09/02/17 0339 09/03/17 0354  09/04/17 2353  09/05/17 0525 09/05/17 1343 09/05/17 1455 09/05/17 2007 09/06/17 0403  WBC 9.3  --  14.4*  --   --  10.4  --  4.8  --   --   --   --   CREATININE 1.57*  1.45*   < > 1.79* 1.53*  --  2.33*  --  2.38*  --   --   --  2.39*  LATICACIDVEN  --   --   --   --    < >  --    < > 3.1* 3.57* 2.9* 2.2* 2.7*   < > = values in this interval not displayed.    Estimated Creatinine Clearance: 18.2 mL/min (A) (by C-G formula based on SCr of 2.39 mg/dL (H)).    No Known Allergies  Antimicrobials this admission:  3/3 Vanc >> 3/4 3/3 Zosyn >>3/6 3/6 augmentin>> last inpt dose on 3/8  3/9 Zosyn >> 3/9 Vanc >>  Dose adjustments this admission:  3/11 decrease vanc from 1g q48h to 750mg  q48h, zosyn EI to 2.25g q8h  Microbiology results:  3/3 ucx: >100K staph aureus (pan sens) 3/3 bcx x2: neg FINAL  3/10 BCx: sent 3/10 UCx: sent  3/10 MRSA PCR: neg   Thank you for allowing pharmacy to be a part of this patient's care.  Peggyann Juba, PharmD, BCPS Pager:  [phone] 09/06/2017 7:12 AM

## 2017-09-06 NOTE — Progress Notes (Signed)
Pt given pain medicine x2 overnight for pain and moaning. Pt reporting pain in hip from radiation. Basline dementia unsure of orientation status. Lactate elevated overnight. 500 NS bolus given. Thick liquid. Pt sleeping throughout night.

## 2017-09-06 NOTE — Progress Notes (Signed)
Lactate of 2.7 text paged to physician

## 2017-09-06 NOTE — Progress Notes (Addendum)
PROGRESS NOTE    [name] Cook  RXV:[id number] DOB: 1927/06/05 DOA: 09/04/2017 PCP: System, Pcp Not In   Brief Narrative: Patient is a 84 year old male with past medical history of type 2 diabetes, hypertension, coronary artery disease, dementia, recent diagnosis of bladder cancer with metastasis who presented from the skilled nursing facility to the emergency department with complaints of lethargy and altered mental status.  Patient was just discharged from here after treatment of urosepsis. Patient was admitted  for the management of sepsis secondary to UTI again.  Patient was found to be febrile, tachycardic and tachypneic on presentation.  Found to have elevated lactic acid level.  Patient has been started on broad-spectrum antibiotics. Started on IV fluids.Patient was also found to have acute kidney injury on CKD Patient seen and examined the bedside this morning.  His mental status looks to have improved but he is significantly weak and critically ill.   Assessment & Plan:   Principal Problem:   Severe sepsis ([place]) Active Problems:   Diabetes mellitus without complication ([place])   Coronary artery disease   Hypertension   Dementia   Cancer of bladder (HCC)   Hypokalemia   AKI (acute kidney injury) ([place])   CKD (chronic kidney disease), stage III (HCC)   Acute encephalopathy   LFT elevation   Severe sepsis: Continue broad-spectrum antibiotics.  Currently afebrile and hemodynamically stable.  Lactic acid remains elevated despite being  IV fluids.  Chest x-ray was clear on presentation.  We will follow-up cultures.  Acute encephalopathy: Likely secondary to sepsis.  Mental status has improved but he remains significantly weak and lethargic.  Patient has a very poor oral intake. He was seen by speech on last admission and was recommended dysphagia 3 diet with honey thickened liquid.  Hypokalemia/hyponatremia: We will start on hypotonic normal saline.  Continue potassium  supplementation.  AKI on  CKD stage III: Continue fluids. Avoid nephrotoxins.  Continue to monitor kidney function  Metastatic bladder cancer:Underwent partial resection in January 2019 and has been undergoing radiation; oncology advised against palliative chemo and recommended hospice .Known sacral and adrenal mets, suspected liver mets  with elevated ALP. Palliative care following.  Very poor prognosis.  He is a hospice candidate.    History of coronary artery disease: Continue current medications  History of hypertension: Currently blood pressure stable.  Hold antihypertensives  Hypokalemia: Supplemented and corrected  Elevated LFTs: Likely secondary to liver metastasis or severe sepsis.  Diabetes type 2: Continue sliding scale insulin.  Deconditioning/poor prognosis: Palliative care following.  Patient family are leaning towards hospice.  They do not want to escalate the management .I think he is a good candidate  for hospice and oncology is also recommending that. He will be started on comfort care but to continue with antibiotics and fluids.  He will be moved to regular floor preferably 3 W.   DVT prophylaxis:SCD Code Status: DNR Family Communication: None present at the bedside Disposition Plan: Hospice   Consultants: Palliative care  Procedures: None  Antimicrobials: Vancomycin and Zosyn since 09/05/17  Subjective: Patient seen and examined at bedside this morning.  Remains lethargic and weak.  He is hardly able to speak  Objective: Vitals:   09/06/17 0600 09/06/17 0700 09/06/17 0800 09/06/17 1200  BP: (!) 146/45 (!) 153/80 (!) 129/56 (!) 124/48  Pulse: 77 (!) 35 92 62  Resp: 19 (!) 24 17 19   Temp: 98.6 F (37 C) 98.2 F (36.8 C) 98.2 F (36.8 C) 98.9 F (37.2 C)  TempSrc:    Axillary  SpO2: 99% 100% 96% 96%  Weight:      Height:        Intake/Output Summary (Last 24 hours) at 09/06/2017 1349 Last data filed at 09/06/2017 0600 Gross per 24 hour  Intake 100  ml  Output 685 ml  Net -585 ml   Filed Weights   09/05/17 1832  Weight: 60.1 kg (132 lb 7.9 oz)    Examination:  General exam: Critically ill elderly male , lethargic, frail HEENT:PERRL,Oral mucosa moist, Ear/Nose normal on gross exam Respiratory system: Bilateral decreased air entry  cardiovascular system: S1 & S2 heard, RRR. No JVD, murmurs, rubs, gallops or clicks. Gastrointestinal system: Abdomen is nondistended, soft and nontender. No organomegaly or masses felt. Normal bowel sounds heard. Central nervous system: Alert but not oriented.  No focal neurological deficits. Extremities: No edema, no clubbing ,no cyanosis, distal peripheral pulses palpable. Skin: No rashes, lesions or ulcers,no icterus ,no pallor  Data Reviewed: I have personally reviewed following labs and imaging studies  CBC: Recent Labs  Lab 08/31/17 0421 09/02/17 0339 09/04/17 2353 09/05/17 0525  WBC 9.3 14.4* 10.4 4.8  NEUTROABS  --   --  9.1* 3.7  HGB 11.8* 13.5 12.7* 12.1*  HCT 36.3* 41.5 39.4 38.1*  MCV 87.7 87.9 87.4 87.6  PLT 313 239 170 229*   Basic Metabolic Panel: Recent Labs  Lab 08/31/17 0421 09/01/17 0354 09/02/17 0339 09/02/17 1247 09/02/17 1802 09/03/17 0354 09/04/17 2353 09/05/17 0525 09/06/17 0403  NA 136  --  139  --   --  139 140 142 149*  K 3.3*  --  2.7* 2.8* 4.6 4.0 3.1* 4.5 3.1*  CL 97*  --  95*  --   --  100* 102 108 115*  CO2 24  --  27  --   --  26 21* 24 24  GLUCOSE 84  --  152*  --   --  165* 172* 194* 118*  BUN 28*  --  28*  --   --  28* 38* 36* 44*  CREATININE 1.57*  1.45* 1.58* 1.79*  --   --  1.53* 2.33* 2.38* 2.39*  CALCIUM 7.9*  --  8.2*  --   --  7.9* 8.0* 7.2* 7.5*  MG 1.9 2.4  --  2.1  --  2.0  --  2.0  --   PHOS 2.6 2.5  --   --   --   --   --   --   --    GFR: Estimated Creatinine Clearance: 18.2 mL/min (A) (by C-G formula based on SCr of 2.39 mg/dL (H)). Liver Function Tests: Recent Labs  Lab 09/04/17 2353 09/05/17 0525 09/06/17 0403  AST  189* 703* 255*  ALT 98* 206* 189*  ALKPHOS 516* 517* 516*  BILITOT 1.2 1.7* 1.2  PROT 7.0 6.3* 6.2*  ALBUMIN 2.3* 2.0* 1.8*   No results for input(s): LIPASE, AMYLASE in the last 168 hours. No results for input(s): AMMONIA in the last 168 hours. Coagulation Profile: Recent Labs  Lab 09/06/17 0403  INR 1.72   Cardiac Enzymes: No results for input(s): CKTOTAL, CKMB, CKMBINDEX, TROPONINI in the last 168 hours. BNP (last 3 results) No results for input(s): PROBNP in the last 8760 hours. HbA1C: No results for input(s): HGBA1C in the last 72 hours. CBG: Recent Labs  Lab 09/05/17 2041 09/06/17 0032 09/06/17 0334 09/06/17 0727 09/06/17 1105  GLUCAP 145* 127* 108* 91 133*   Lipid Profile: No  results for input(s): CHOL, HDL, LDLCALC, TRIG, CHOLHDL, LDLDIRECT in the last 72 hours. Thyroid Function Tests: No results for input(s): TSH, T4TOTAL, FREET4, T3FREE, THYROIDAB in the last 72 hours. Anemia Panel: No results for input(s): VITAMINB12, FOLATE, FERRITIN, TIBC, IRON, RETICCTPCT in the last 72 hours. Sepsis Labs: Recent Labs  Lab 09/05/17 1343 09/05/17 1455 09/05/17 2007 09/06/17 0403  LATICACIDVEN 3.57* 2.9* 2.2* 2.7*    Recent Results (from the past 240 hour(s))  Blood Culture (routine x 2)     Status: None   Collection Time: 08/29/17  4:53 PM  Result Value Ref Range Status   Specimen Description   Final    RIGHT ANTECUBITAL Performed at [facility] [address] [place], [place] [address]    Special Requests   Final    BOTTLES DRAWN AEROBIC AND ANAEROBIC Blood Culture adequate volume Performed at [facility] [address] [place], [place] [address]    Culture   Final    NO GROWTH 5 DAYS Performed at [facility], [place] [address] [place], [place] [address]    Report Status 09/04/2017 FINAL  Final  Urine culture     Status: Abnormal   Collection Time: 08/29/17  4:53 PM  Result Value Ref Range Status    Specimen Description   Final    URINE, CLEAN CATCH Performed at [facility] [place] [address] [place], [place] [address]    Special Requests   Final    NONE Performed at [facility], [place] [address] [place], [place] [address]    Culture >=100,000 COLONIES/mL STAPHYLOCOCCUS AUREUS (A)  Final   Report Status 09/01/2017 FINAL  Final   Organism ID, Bacteria STAPHYLOCOCCUS AUREUS (A)  Final      Susceptibility   Staphylococcus aureus - MIC*    CIPROFLOXACIN <=0.5 SENSITIVE Sensitive     GENTAMICIN <=0.5 SENSITIVE Sensitive     NITROFURANTOIN <=16 SENSITIVE Sensitive     OXACILLIN 0.5 SENSITIVE Sensitive     TETRACYCLINE <=1 SENSITIVE Sensitive     VANCOMYCIN <=0.5 SENSITIVE Sensitive     TRIMETH/SULFA <=10 SENSITIVE Sensitive     CLINDAMYCIN <=0.25 SENSITIVE Sensitive     RIFAMPIN <=0.5 SENSITIVE Sensitive     Inducible Clindamycin NEGATIVE Sensitive     * >=100,000 COLONIES/mL STAPHYLOCOCCUS AUREUS  Blood Culture (routine x 2)     Status: None   Collection Time: 08/29/17  4:58 PM  Result Value Ref Range Status   Specimen Description   Final    LEFT ANTECUBITAL Performed at [facility] [address], [place], [place] [address]    Special Requests   Final    BOTTLES DRAWN AEROBIC AND ANAEROBIC Blood Culture adequate volume Performed at [facility] [address], [place], [place] [address]    Culture   Final    NO GROWTH 5 DAYS Performed at [facility], [place] [address] [place],  [address]    Report Status 09/04/2017 FINAL  Final  MRSA PCR Screening     Status: None   Collection Time: 08/30/17 12:47 AM  Result Value Ref Range Status   MRSA by PCR NEGATIVE NEGATIVE Final    Comment:        The GeneXpert MRSA Assay (FDA approved for NASAL specimens only), is one component of a comprehensive MRSA colonization surveillance program. It is not intended to diagnose MRSA infection nor  to guide or monitor treatment for  MRSA infections. Performed at [facility], [place] [address], [place], [place] [address]   Blood Culture (routine x 2)     Status: None (Preliminary result)   Collection Time: 09/04/17 11:46 PM  Result Value Ref Range Status   Specimen Description   Final    BLOOD RIGHT HAND Performed at [facility] [address], [place], [place] [address]    Special Requests   Final    BOTTLES DRAWN AEROBIC AND ANAEROBIC Blood Culture results may not be optimal due to an inadequate volume of blood received in culture bottles Performed at [facility] [address], [place], [place] [address]    Culture   Final    NO GROWTH 1 DAY Performed at [facility], [place] [address], [place], [place] [address]    Report Status PENDING  Incomplete  Blood Culture (routine x 2)     Status: None (Preliminary result)   Collection Time: 09/04/17 11:52 PM  Result Value Ref Range Status   Specimen Description   Final    BLOOD BLOOD LEFT FOREARM Performed at [facility] [address], [place], [place] [address]    Special Requests   Final    BOTTLES DRAWN AEROBIC AND ANAEROBIC Blood Culture adequate volume Performed at [facility] [address], [place], [place] [address]    Culture   Final    NO GROWTH 1 DAY Performed at [facility], [place] [address] [place], [place] [address]    Report Status PENDING  Incomplete  Culture, Urine     Status: Abnormal   Collection Time: 09/05/17 12:59 AM  Result Value Ref Range Status   Specimen Description   Final    URINE, CATHETERIZED Performed at [facility] [address] [place], [place] [address]    Special Requests   Final    NONE Performed at [facility], [place] [address] [place], [place] [address]    Culture MULTIPLE SPECIES PRESENT, SUGGEST RECOLLECTION (A)  Final   Report  Status 09/06/2017 FINAL  Final  MRSA PCR Screening     Status: None   Collection Time: 09/05/17  2:50 PM  Result Value Ref Range Status   MRSA by PCR NEGATIVE NEGATIVE Final    Comment:        The GeneXpert MRSA Assay (FDA approved for NASAL specimens only), is one component of a comprehensive MRSA colonization surveillance program. It is not intended to diagnose MRSA infection nor to guide or monitor treatment for MRSA infections. Performed at [facility], [place] [address], [place], [place] [address]          Radiology Studies: US Renal  Result Date: 09/05/2017 CLINICAL DATA:  84 y/o M; history of left hydronephrosis and bladder mass. EXAM: RENAL / URINARY TRACT ULTRASOUND COMPLETE COMPARISON:  08/30/2017 renal ultrasound. 08/29/2017 CT abdomen and pelvis. FINDINGS: Right Kidney: Length: 11.0 cm. Echogenicity within normal limits. No mass or hydronephrosis visualized. Left Kidney: Length: 9.7 cm. Measurements in oblique plane, likely under sample. Stable moderate hydronephrosis. Bladder: Stable ill-defined mass within the wall of the bladder demonstrating blood flow on color Doppler. IMPRESSION: 1. Stable left moderate hydronephrosis. 2. Stable ill-defined mass within the wall of the bladder. Electronically Signed   By: Kristine Garbe M.D.   On: 09/05/2017 05:09   Dg Chest Port 1 View  Result Date: 09/05/2017 CLINICAL DATA:  Acute onset of tachycardia.  Altered mental status. EXAM: PORTABLE CHEST 1 VIEW COMPARISON:  Chest radiograph performed 08/29/2017 FINDINGS: The lungs are well-aerated. Mild bibasilar atelectasis is noted. There is no evidence of pleural effusion or pneumothorax. The cardiomediastinal silhouette is within normal limits. No acute osseous abnormalities are seen. IMPRESSION: Mild bibasilar atelectasis noted.  Lungs otherwise clear. Electronically Signed   By: Garald Balding M.D.   On: 09/05/2017 00:57        Scheduled Meds: .  artificial tears  1 application Both Eyes QHS  . feeding supplement (ENSURE ENLIVE)  237 mL Oral TID BM  . heparin  5,000 Units Subcutaneous Q8H  . hydrocortisone  25 mg Rectal BID  . insulin aspart  0-9 Units Subcutaneous Q4H  . metoprolol tartrate  50 mg Oral BID  . sodium chloride flush  3 mL Intravenous Q12H   Continuous Infusions: . sodium chloride 100 mL/hr at 09/06/17 0941  . piperacillin-tazobactam (ZOSYN)  IV Stopped (09/06/17 1100)  . potassium chloride 10 mEq (09/06/17 1250)  . [START ON 09/07/2017] vancomycin       LOS: 1 day    Time spent: 25 mins     Elisavet Buehrer Jodie Echevaria, MD [facility] Pager [phone]  If 7PM-7AM, please contact night-coverage [email or web address] Password [email or web address] 09/06/2017, 1:49 PM

## 2017-09-06 NOTE — Progress Notes (Signed)
Palliative:  I met today with Mr.Bembenek. He appears very lethargic and struggles to make eye contact with me but is able to for a brief few moments. He has received pain medication and is able to answer simple yes/no questions. He answers no when asked if he is having any pain. He denies any other discomforts. Heart rate is better controlled and he appears stable but ill and approaching EOL. No family/visitors at beside.   I did call and speak with daughter, Shauna Hugh. Diane plans to be here shortly and her children plan to join her this evening. I explained to Diane my assessment and also explained concern for aspiration risk and poor intake. I explained that these symptoms are not unexpected given how ill he is. She is tearful but understands. Goal is maintained to continue IVF/abx and for comfort otherwise and consider transition to hospice facility tomorrow. I recommended to titrate down pills given aspiration risk and to treat comfort via IV medications as well as transition to regular floor room for better comfort - Diane agrees with plan. Emotional support provided.   35 min  Vinie Sill, NP Palliative Medicine Team Pager # [phone] (M-F 8a-5p) Team Phone # [phone] (Nights/Weekends)

## 2017-09-07 ENCOUNTER — Ambulatory Visit: Payer: Medicare Other

## 2017-09-07 LAB — BASIC METABOLIC PANEL
ANION GAP: 12 (ref 5–15)
BUN: 49 mg/dL — AB (ref 6–20)
CHLORIDE: 115 mmol/L — AB (ref 101–111)
CO2: 21 mmol/L — ABNORMAL LOW (ref 22–32)
Calcium: 7.5 mg/dL — ABNORMAL LOW (ref 8.9–10.3)
Creatinine, Ser: 2.75 mg/dL — ABNORMAL HIGH (ref 0.61–1.24)
GFR calc Af Amer: 22 mL/min — ABNORMAL LOW (ref >60)
GFR, EST NON AFRICAN AMERICAN: 19 mL/min — AB (ref >60)
GLUCOSE: 131 mg/dL — AB (ref 65–99)
POTASSIUM: 3.7 mmol/L (ref 3.5–5.1)
Sodium: 148 mmol/L — ABNORMAL HIGH (ref 135–145)

## 2017-09-07 LAB — CBC WITH DIFFERENTIAL/PLATELET
BASOS PCT: 0 %
Basophils Absolute: 0 10*3/uL (ref 0.0–0.1)
EOS PCT: 0 %
Eosinophils Absolute: 0 10*3/uL (ref 0.0–0.7)
HEMATOCRIT: 35.1 % — AB (ref 39.0–52.0)
HEMOGLOBIN: 10.9 g/dL — AB (ref 13.0–17.0)
LYMPHS PCT: 8 %
Lymphs Abs: 0.6 10*3/uL — ABNORMAL LOW (ref 0.7–4.0)
MCH: 27.4 pg (ref 26.0–34.0)
MCHC: 31.1 g/dL (ref 30.0–36.0)
MCV: 88.2 fL (ref 78.0–100.0)
MONOS PCT: 5 %
Monocytes Absolute: 0.4 10*3/uL (ref 0.1–1.0)
NEUTROS ABS: 6.8 10*3/uL (ref 1.7–7.7)
Neutrophils Relative %: 87 %
Platelets: 122 10*3/uL — ABNORMAL LOW (ref 150–400)
RBC: 3.98 MIL/uL — ABNORMAL LOW (ref 4.22–5.81)
RDW: 16.2 % — ABNORMAL HIGH (ref 11.5–15.5)
WBC MORPHOLOGY: INCREASED
WBC: 7.8 10*3/uL (ref 4.0–10.5)

## 2017-09-07 LAB — GLUCOSE, CAPILLARY
GLUCOSE-CAPILLARY: 132 mg/dL — AB (ref 65–99)
GLUCOSE-CAPILLARY: 151 mg/dL — AB (ref 65–99)
GLUCOSE-CAPILLARY: 99 mg/dL (ref 65–99)
GLUCOSE-CAPILLARY: 143 mg/dL — AB (ref 65–99)
Glucose-Capillary: 94 mg/dL (ref 65–99)

## 2017-09-07 LAB — LACTIC ACID, PLASMA
Lactic Acid, Venous: 3.1 mmol/L (ref 0.5–1.9)
Lactic Acid, Venous: 3 mmol/L (ref 0.5–1.9)

## 2017-09-07 MED ORDER — SODIUM CHLORIDE 0.9 % IV BOLUS (SEPSIS)
500.0000 mL | Freq: Once | INTRAVENOUS | Status: AC
  Administered 2017-09-07: 500 mL via INTRAVENOUS

## 2017-09-07 MED ORDER — DEXTROSE-NACL 5-0.9 % IV SOLN
INTRAVENOUS | Status: DC
  Administered 2017-09-07: 75 mL/h via INTRAVENOUS

## 2017-09-07 NOTE — Progress Notes (Signed)
Palliative:  Mr. [name] Cook is lying in bed. Answers simple yes/no questions. Denies pain. When I ask how he is doing he says "okay." RN at bedside and says he only ate a bite of his lunch.   I spoke more with daughter, [name] Cook, about transition to hospice facility. I explained that his renal function is actually worse today despite IVF. I explained that I do not believe the IVF or antibiotics are helpful and agree to proceed with transition to hospice at this time. She is saddened but agrees. All questions/concerns addressed. Emotional support provided.   15 min  Vinie Sill, NP Palliative Medicine Team Pager # [phone] (M-F 8a-5p) Team Phone # [phone] (Nights/Weekends)

## 2017-09-07 NOTE — Progress Notes (Addendum)
PROGRESS NOTE    [name] Cook  HYI:[id number] DOB: 11-24-1926 DOA: 09/04/2017 PCP: System, Pcp Not In   Brief Narrative: Patient is a 84 year old male with past medical history of type 2 diabetes, hypertension, coronary artery disease, dementia, recent diagnosis of bladder cancer with metastasis who presented from the skilled nursing facility to the emergency department with complaints of lethargy and altered mental status.  Patient was just discharged from here after treatment of urosepsis. Patient was admitted  for the management of sepsis secondary to UTI again.  Patient was found to be febrile, tachycardic and tachypneic on presentation.  Found to have elevated lactic acid level.  Patient has been started on broad-spectrum antibiotics. Started on IV fluids.Patient was also found to have acute kidney injury on CKD Patient has very poor prognosis and hospice was suggested by his oncologist.  Palliative care is following.  Since patient remains critically ill, I discussed with the family today and they are agreeable for hospice initiation.  Social Worker consulted.  Assessment & Plan:   Principal Problem:   Severe sepsis ([place]) Active Problems:   Diabetes mellitus without complication ([place])   Coronary artery disease   Hypertension   Dementia   Cancer of bladder (HCC)   Hypokalemia   AKI (acute kidney injury) ([place])   CKD (chronic kidney disease), stage III (HCC)   Acute encephalopathy   LFT elevation   Goals of care, counseling/discussion   Palliative care encounter   Severe sepsis: On broad-spectrum antibiotics.  Currently afebrile and hemodynamically stable.  Lactic acid remains elevated despite being  IV fluids.  Chest x-ray was clear on presentation.  Acute encephalopathy: Likely secondary to sepsis.  Mental status has improved but he remains significantly weak and lethargic.  Patient has a very poor oral intake. He was seen by speech on last admission and was recommended  dysphagia 3 diet with honey thickened liquid.  Hypernatremia: On D5NS due to poor oral intake.  AKI on  CKD stage III: On IV fluids without much improvement. Avoid nephrotoxins.   Metastatic bladder cancer:Underwent partial resection in January 2019 and has been undergoing radiation; oncology advised against palliative chemo and recommended hospice .Known sacral and adrenal mets, suspected liver mets  with elevated ALP. Palliative care following.  Very poor prognosis.  He is a hospice candidate.    History of coronary artery disease: Continue current medications  History of hypertension: Currently blood pressure stable.  Hold antihypertensives  Hypokalemia: Supplemented and corrected  Elevated LFTs: Likely secondary to liver metastasis or severe sepsis.  Diabetes type 2: Continue sliding scale insulin.  Deconditioning/poor prognosis: Palliative care following.  Patient family are interested in transitioning to hospice.  They do not want to escalate the management .I think he is a good candidate  for hospice and oncology is also recommending that. He will  Be continued with antibiotics and fluids until palliative care initiated full comfort measures.  He has been moved to  3 W. I  will not do any blood work from today.   DVT prophylaxis:SCD Code Status: DNR Family Communication: Discussed with the son and daughter on the bedside Disposition Plan: Hospice   Consultants: Palliative care  Procedures: None  Antimicrobials: Vancomycin and Zosyn since 09/05/17  Subjective: Patient seen and examined the bedside.  Remains extremely lethargic.  Objective: Vitals:   09/06/17 2209 09/07/17 0149 09/07/17 0507 09/07/17 1242  BP: (!) 139/54 130/62 (!) 143/58 (!) 151/60  Pulse: 95 82 84 78  Resp: 20 20 20  20  Temp: 98.1 F (36.7 C) 98 F (36.7 C) 98.2 F (36.8 C) (!) 97.4 F (36.3 C)  TempSrc: Axillary Oral Axillary Axillary  SpO2: 100% 100% 100% 100%  Weight:      Height:         Intake/Output Summary (Last 24 hours) at 09/07/2017 1544 Last data filed at 09/07/2017 0948 Gross per 24 hour  Intake -  Output 1500 ml  Net -1500 ml   Filed Weights   09/05/17 1832  Weight: 60.1 kg (132 lb 7.9 oz)    Examination:  General exam: Critically ill elderly male, frail, lethargic HEENT:PERRL,Oral mucosa moist, Ear/Nose normal on gross exam Respiratory system: Bilateral decreased air entry cardiovascular system: S1 & S2 heard, RRR. No JVD, murmurs, rubs, gallops or clicks. Gastrointestinal system: Abdomen is nondistended, soft and nontender. No organomegaly or masses felt. Normal bowel sounds heard. Central nervous system: Alert but not  oriented. No focal neurological deficits. Extremities: No edema, no clubbing ,no cyanosis, distal peripheral pulses palpable. Skin: No rashes, no icterus ,no pallor MSK: Cachectic  Data Reviewed: I have personally reviewed following labs and imaging studies  CBC: Recent Labs  Lab 09/02/17 0339 09/04/17 2353 09/05/17 0525 09/07/17 0402  WBC 14.4* 10.4 4.8 7.8  NEUTROABS  --  9.1* 3.7 6.8  HGB 13.5 12.7* 12.1* 10.9*  HCT 41.5 39.4 38.1* 35.1*  MCV 87.9 87.4 87.6 88.2  PLT 239 170 131* 268*   Basic Metabolic Panel: Recent Labs  Lab 09/01/17 0354  09/02/17 1247  09/03/17 0354 09/04/17 2353 09/05/17 0525 09/06/17 0403 09/07/17 0402  NA  --    < >  --   --  139 140 142 149* 148*  K  --    < > 2.8*   < > 4.0 3.1* 4.5 3.1* 3.7  CL  --    < >  --   --  100* 102 108 115* 115*  CO2  --    < >  --   --  26 21* 24 24 21*  GLUCOSE  --    < >  --   --  165* 172* 194* 118* 131*  BUN  --    < >  --   --  28* 38* 36* 44* 49*  CREATININE 1.58*   < >  --   --  1.53* 2.33* 2.38* 2.39* 2.75*  CALCIUM  --    < >  --   --  7.9* 8.0* 7.2* 7.5* 7.5*  MG 2.4  --  2.1  --  2.0  --  2.0  --   --   PHOS 2.5  --   --   --   --   --   --   --   --    < > = values in this interval not displayed.   GFR: Estimated Creatinine Clearance: 15.8  mL/min (A) (by C-G formula based on SCr of 2.75 mg/dL (H)). Liver Function Tests: Recent Labs  Lab 09/04/17 2353 09/05/17 0525 09/06/17 0403  AST 189* 703* 255*  ALT 98* 206* 189*  ALKPHOS 516* 517* 516*  BILITOT 1.2 1.7* 1.2  PROT 7.0 6.3* 6.2*  ALBUMIN 2.3* 2.0* 1.8*   No results for input(s): LIPASE, AMYLASE in the last 168 hours. No results for input(s): AMMONIA in the last 168 hours. Coagulation Profile: Recent Labs  Lab 09/06/17 0403  INR 1.72   Cardiac Enzymes: No results for input(s): CKTOTAL, CKMB, CKMBINDEX, TROPONINI in the last  168 hours. BNP (last 3 results) No results for input(s): PROBNP in the last 8760 hours. HbA1C: No results for input(s): HGBA1C in the last 72 hours. CBG: Recent Labs  Lab 09/06/17 2006 09/06/17 2357 09/07/17 0421 09/07/17 0812 09/07/17 1240  GLUCAP 62* 94 132* 151* 99   Lipid Profile: No results for input(s): CHOL, HDL, LDLCALC, TRIG, CHOLHDL, LDLDIRECT in the last 72 hours. Thyroid Function Tests: No results for input(s): TSH, T4TOTAL, FREET4, T3FREE, THYROIDAB in the last 72 hours. Anemia Panel: No results for input(s): VITAMINB12, FOLATE, FERRITIN, TIBC, IRON, RETICCTPCT in the last 72 hours. Sepsis Labs: Recent Labs  Lab 09/06/17 0403 09/06/17 1620 09/07/17 0402 09/07/17 0818  LATICACIDVEN 2.7* 3.2* 3.1* 3.0*    Recent Results (from the past 240 hour(s))  Blood Culture (routine x 2)     Status: None   Collection Time: 08/29/17  4:53 PM  Result Value Ref Range Status   Specimen Description   Final    RIGHT ANTECUBITAL Performed at [facility], [place] [address], [place], [place] [address]    Special Requests   Final    BOTTLES DRAWN AEROBIC AND ANAEROBIC Blood Culture adequate volume Performed at [place] [address] [place], [place] [address]    Culture   Final    NO GROWTH 5 DAYS Performed at [facility], [place] [address], [place], [place] [address]     Report Status 09/04/2017 FINAL  Final  Urine culture     Status: Abnormal   Collection Time: 08/29/17  4:53 PM  Result Value Ref Range Status   Specimen Description   Final    URINE, CLEAN CATCH Performed at [facility], [place] [address] [place], [place] [address]    Special Requests   Final    NONE Performed at [facility], [place] [address], [place], [place] [address]    Culture >=100,000 COLONIES/mL STAPHYLOCOCCUS AUREUS (A)  Final   Report Status 09/01/2017 FINAL  Final   Organism ID, Bacteria STAPHYLOCOCCUS AUREUS (A)  Final      Susceptibility   Staphylococcus aureus - MIC*    CIPROFLOXACIN <=0.5 SENSITIVE Sensitive     GENTAMICIN <=0.5 SENSITIVE Sensitive     NITROFURANTOIN <=16 SENSITIVE Sensitive     OXACILLIN 0.5 SENSITIVE Sensitive     TETRACYCLINE <=1 SENSITIVE Sensitive     VANCOMYCIN <=0.5 SENSITIVE Sensitive     TRIMETH/SULFA <=10 SENSITIVE Sensitive     CLINDAMYCIN <=0.25 SENSITIVE Sensitive     RIFAMPIN <=0.5 SENSITIVE Sensitive     Inducible Clindamycin NEGATIVE Sensitive     * >=100,000 COLONIES/mL STAPHYLOCOCCUS AUREUS  Blood Culture (routine x 2)     Status: None   Collection Time: 08/29/17  4:58 PM  Result Value Ref Range Status   Specimen Description   Final    LEFT ANTECUBITAL Performed at [place] [address] [place], [place] [address]    Special Requests   Final    BOTTLES DRAWN AEROBIC AND ANAEROBIC Blood Culture adequate volume Performed at [place] [address], [place], [place] [address]    Culture   Final    NO GROWTH 5 DAYS Performed at [facility], [place] [address] [place], [place] [address]    Report Status 09/04/2017 FINAL  Final  MRSA PCR Screening     Status: None   Collection Time: 08/30/17 12:47 AM  Result Value Ref Range Status  MRSA by PCR NEGATIVE NEGATIVE Final    Comment:        The GeneXpert MRSA Assay (FDA approved for NASAL  specimens only), is one component of a comprehensive MRSA colonization surveillance program. It is not intended to diagnose MRSA infection nor to guide or monitor treatment for MRSA infections. Performed at [facility], [place] [address], [place], [place] [address]   Blood Culture (routine x 2)     Status: None (Preliminary result)   Collection Time: 09/04/17 11:46 PM  Result Value Ref Range Status   Specimen Description   Final    BLOOD RIGHT HAND Performed at [place] [address], [place], [place] [address]    Special Requests   Final    BOTTLES DRAWN AEROBIC AND ANAEROBIC Blood Culture results may not be optimal due to an inadequate volume of blood received in culture bottles Performed at [place] [address] [place], [place] [address]    Culture   Final    NO GROWTH 2 DAYS Performed at [place] [address], [place], [place] [address]    Report Status PENDING  Incomplete  Blood Culture (routine x 2)     Status: None (Preliminary result)   Collection Time: 09/04/17 11:52 PM  Result Value Ref Range Status   Specimen Description   Final    BLOOD BLOOD LEFT FOREARM Performed at [place] [address] [place], [place] [address]    Special Requests   Final    BOTTLES DRAWN AEROBIC AND ANAEROBIC Blood Culture adequate volume Performed at [place] [address] [place], [place] [address]    Culture   Final    NO GROWTH 2 DAYS Performed at [place] [address], [place], [place] [address]    Report Status PENDING  Incomplete  Culture, Urine     Status: Abnormal   Collection Time: 09/05/17 12:59 AM  Result Value Ref Range Status   Specimen Description   Final    URINE, CATHETERIZED Performed at [place] [address], [place], [place] [address]    Special Requests   Final    NONE Performed at [facility], [place] [address] [place], [place] [address]    Culture MULTIPLE SPECIES PRESENT, SUGGEST RECOLLECTION (A)  Final   Report Status 09/06/2017 FINAL  Final  MRSA PCR Screening     Status: None   Collection Time: 09/05/17  2:50 PM  Result Value Ref Range Status   MRSA by PCR NEGATIVE NEGATIVE Final    Comment:        The GeneXpert MRSA Assay (FDA approved for NASAL specimens only), is one component of a comprehensive MRSA colonization surveillance program. It is not intended to diagnose MRSA infection nor to guide or monitor treatment for MRSA infections. Performed at [facility], [place] [address], [place], [place] [address]          Radiology Studies: No results found.      Scheduled Meds: . artificial tears  1 application Both Eyes QHS  . feeding supplement (ENSURE ENLIVE)  237 mL Oral TID BM  . heparin  5,000 Units Subcutaneous Q8H  . hydrocortisone  25 mg Rectal BID  . insulin aspart  0-9 Units Subcutaneous Q4H  . metoprolol tartrate  50 mg Oral BID  . sodium chloride flush  3 mL Intravenous Q12H  Continuous Infusions: . dextrose 5 % and 0.9% NaCl 75 mL/hr (09/07/17 0759)  . piperacillin-tazobactam (ZOSYN)  IV Stopped (09/07/17 1203)  . vancomycin       LOS: 2 days    Time spent: 25 mins     Elliot Simoneaux Jodie Echevaria, MD [facility] Pager [phone]  If 7PM-7AM, please contact night-coverage [email or web address] Password [facility] 09/07/2017, 3:44 PM

## 2017-09-07 NOTE — Progress Notes (Signed)
CSW consulted to assist with pt transitioning to hospice care. Pt and family known to CSW from recent admission. At that time facilitated return to [facility], however daughter reports pt's status continues to decline and they have discussed with attending and palliative team that transitioning to hospice care is their desire at this point.  Discussed options of residential hospice facilities- [facility] is preference, states they would consider other facilities if unavailable. Made referral and will follow to assist.  See below for complete assessment completed 08/30/17. Notable changes in situation discussed above.  Sharren Bridge, MSW, LCSW Clinical Social Work 09/07/2017 [phone]     Clinical Social Work Assessment  Patient Details  Name: [name] Cook MRN: [id number] Date of Birth: February 11, 1927  Date of referral:  08/30/17               Reason for consult:  (pt admitted from facility)                   Permission sought to share information with:    Permission granted to share information::                Name::     daughter Shauna Hugh             Agency::                Relationship::                Contact Information:     Housing/Transportation Living arrangements for the past 2 months:  Assisted Living Facility(pt has been at [facility] for rehab since 07/20/17) Source of Information:  Facility, Medical Team Patient Interpreter Needed:  None Criminal Activity/Legal Involvement Pertinent to Current Situation/Hospitalization:  No - Comment as needed Significant Relationships:  Adult Children, Warehouse manager Lives with:  Facility Resident Do you feel safe going back to the place where you live?  Yes Need for family participation in patient care:  Yes (Comment)(pt with dementia- per past assessments daughter primary decision maker)  Care giving concerns:  Pt admitted from [facility] SNF- has been there for rehab since 07/20/17. Previously was resident of [facility]  per chart. Currently admitted for acute metabolic encephalopathy in the setting of sepsis 2/2 pyelonephritis. Per review of chart also patient of [facility] receiving palliative radiation for recently diagnosed metastasized urothelial carcinoma(07/19/17)  Social Worker assessment / plan:  CSW following for assistance with disposition as pt is admitted from a facility- has been at [facility] SNF since 1/22 and prior to that was at [facility]-see above.  Pt sleeping- unable to gather information. Left voicemail for daughter to gather care information and will follow up with her and pt. Per [facility], pt has been there 45 days for rehab. PT eval ordered and CSW will appreciate assistance in determining pt's level of need once closer to [place]. Will follow and assist.   Employment status:  Retired Forensic scientist:  Medicare PT Recommendations:  Not assessed at this time(pending) Information / Referral to community resources:     Patient/Family's Response to care:  UTA  Patient/Family's Understanding of and Emotional Response to Diagnosis, Current Treatment, and Prognosis:  UTA  Emotional Assessment Appearance:  Appears stated age Attitude/Demeanor/Rapport:  (sleeping) Affect (typically observed):  Calm Orientation:  (UTA) Alcohol / Substance use:  Not Applicable Psych involvement (Current and /or in the community):  No (Comment)  Discharge Needs  Concerns to be addressed:  Care Coordination, Decision making concerns, Discharge Planning  Concerns Readmission within the last 30 days:  No Current discharge risk:  (still assessing) Barriers to Discharge:  Continued Medical Work up   [employer], LCSW 08/30/2017, 3:43 PM [phone]

## 2017-09-07 NOTE — Progress Notes (Signed)
CRITICAL VALUE STICKER  CRITICAL VALUE: Lactic level 3.1  RECEIVER: Melrose Nakayama RN  DATE & TIME NOTIFIED: 09/07/17 0452  MESSENGER (representative from lab):  MD NOTIFIED:  Schorr NP  [place]: [id number]  RESPONSE:  Awaiting Respond

## 2017-09-07 NOTE — Consult Note (Signed)
[facility] [place] [facility])  Received request from [place] for family interest in [facility]. Chart reviewed. Farrel Gordon, [facility] RN Liaison spoke with family by phone to confirm interest and explain services. Family aware no [employer] availability today. Will follow up with CSW and family tomorrow.   Thank you,  [name], LCSW [phone]

## 2017-09-08 ENCOUNTER — Ambulatory Visit: Payer: Medicare Other

## 2017-09-08 DIAGNOSIS — F0390 Unspecified dementia without behavioral disturbance: Secondary | ICD-10-CM

## 2017-09-08 DIAGNOSIS — A419 Sepsis, unspecified organism: Principal | ICD-10-CM

## 2017-09-08 DIAGNOSIS — R6520 Severe sepsis without septic shock: Secondary | ICD-10-CM

## 2017-09-08 DIAGNOSIS — C679 Malignant neoplasm of bladder, unspecified: Secondary | ICD-10-CM

## 2017-09-08 LAB — CREATININE, SERUM
CREATININE: 3.12 mg/dL — AB (ref 0.61–1.24)
GFR, EST AFRICAN AMERICAN: 19 mL/min — AB (ref >60)
GFR, EST NON AFRICAN AMERICAN: 16 mL/min — AB (ref >60)

## 2017-09-08 LAB — GLUCOSE, CAPILLARY
GLUCOSE-CAPILLARY: 163 mg/dL — AB (ref 65–99)
Glucose-Capillary: 116 mg/dL — ABNORMAL HIGH (ref 65–99)
Glucose-Capillary: 118 mg/dL — ABNORMAL HIGH (ref 65–99)
Glucose-Capillary: 139 mg/dL — ABNORMAL HIGH (ref 65–99)
Glucose-Capillary: 121 mg/dL — ABNORMAL HIGH (ref 65–99)

## 2017-09-08 NOTE — Progress Notes (Signed)
Pt will admit to [facility]- pt's daughter completed admission paperwork with liaison at bedside. Arranged [facility] transportation.  Sharren Bridge, MSW, LCSW Clinical Social Work 09/08/2017 [phone]

## 2017-09-08 NOTE — Discharge Summary (Signed)
Physician Discharge Summary  [name] Cook:[id number] DOB: [birth year] DOA: 09/04/2017  PCP: System, Pcp Not In  Admit date: 09/04/2017 Discharge date: 09/08/2017  Admitted From:  SNF Disposition:  Hospice home   Discharge Condition:  Fair CODE STATUS:  DNR  Consultations:  Oncology  Palliative care    Discharge Diagnoses:  Principal Problem:   Severe sepsis ([name] Cook) Active Problems:   Diabetes mellitus without complication ([name] Cook)   Coronary artery disease   Hypertension   Dementia   Cancer of bladder ([name] Cook)   Hypokalemia   AKI (acute kidney injury) ([name] Cook)   CKD (chronic kidney disease), stage III ([name] Cook)   Acute encephalopathy   LFT elevation   Goals of care, counseling/discussion   Palliative care encounter       Brief Summary: Patient is a 84 year old male with past medical history of type 2 diabetes, hypertension, coronary artery disease, dementia, recent diagnosis of bladder cancer with metastasis who presented from the skilled nursing facility to the emergency department with complaints of lethargy and altered mental status.  He was just discharged from the hospital on 3/8 after treatment of urosepsis. He was admitted for the management of sepsis secondary to UTI again. He was found to be febrile,tachycardic and tachypneic on presentation with an elevated lactic acid level and an AKI Started on broad-spectrum antibiotics & IV fluids.  Due to his poor prognosis, hospice was suggested by his oncologist.  Palliative care has been assisting with management and it has been decided that the best place for [name] Cook at this time will be a hospice home.    Discharge Exam: Vitals:   09/08/17 0445 09/08/17 0959  BP: 133/89 (!) 144/71  Pulse: (!) 106 88  Resp: 18 18  Temp: (!) 97.1 F (36.2 C) (!) 97.4 F (36.3 C)  SpO2: 97% 96%   Vitals:   09/07/17 1800 09/07/17 2120 09/08/17 0445 09/08/17 0959  BP:  128/67 133/89 (!) 144/71  Pulse:  91 (!) 106 88  Resp:  18 18 18    Temp:  97.9 F (36.6 C) (!) 97.1 F (36.2 C) (!) 97.4 F (36.3 C)  TempSrc:  Oral Oral Oral  SpO2: 98% 99% 97% 96%  Weight:      Height:        General: Pt is alert, awake, not in acute distress Cardiovascular: RRR, S1/S2 +, no rubs, no gallops Respiratory: CTA bilaterally, no wheezing, no rhonchi Abdominal: Soft, NT, ND, bowel sounds + Extremities: no edema, no cyanosis   Discharge Instructions   Allergies as of 09/08/2017   No Known Allergies     Medication List    STOP taking these medications   amLODipine 5 MG tablet Commonly known as:  NORVASC   amoxicillin-clavulanate 875-125 MG tablet Commonly known as:  AUGMENTIN   artificial tears Oint ophthalmic ointment Commonly known as:  LACRILUBE   docusate sodium 100 MG capsule Commonly known as:  COLACE   ENSURE NUTRITION SHAKE Liqd   hydrocortisone 25 MG suppository Commonly known as:  ANUSOL-HC   metoprolol tartrate 50 MG tablet Commonly known as:  LOPRESSOR   MILK OF MAGNESIA CONCENTRATE PO   nitroGLYCERIN 0.4 MG SL tablet Commonly known as:  NITROSTAT   oxyCODONE 5 MG immediate release tablet Commonly known as:  Oxy IR/ROXICODONE   polyethylene glycol packet Commonly known as:  MIRALAX / GLYCOLAX   potassium chloride SA 20 MEQ tablet Commonly known as:  K-DUR,KLOR-CON   PRESERVISION AREDS PO   rosuvastatin 20 MG tablet  Commonly known as:  CRESTOR   saccharomyces boulardii 250 MG capsule Commonly known as:  FLORASTOR   senna 8.6 MG Tabs tablet Commonly known as:  SENOKOT     TAKE these medications   acetaminophen 500 MG tablet Commonly known as:  TYLENOL Take 500 mg by mouth every 6 (six) hours as needed for moderate pain.      Contact information for after-discharge care    Destination    [facility] .   Service:  Skilled Nursing Contact information: [place] [phone]             No Known  Allergies   Procedures/Studies:    Ct Abdomen Pelvis Wo Contrast  Result Date: 08/29/2017 CLINICAL DATA:  Nausea, vomiting. Current history of bladder cancer. EXAM: CT ABDOMEN AND PELVIS WITHOUT CONTRAST TECHNIQUE: Multidetector CT imaging of the abdomen and pelvis was performed following the standard protocol without IV contrast. COMPARISON:  CT scan of August 09, 2017. FINDINGS: Lower chest: Increased bibasilar ill-defined densities are noted concerning for atelectasis or possibly inflammation. Hepatobiliary: Right hepatic low density is again noted concerning for metastatic disease. Status post cholecystectomy. No biliary dilatation. Pancreas: Unremarkable. No pancreatic ductal dilatation or surrounding inflammatory changes. Spleen: Normal in size without focal abnormality. Adrenals/Urinary Tract: 3.7 cm left adrenal mass is noted concerning for possible metastatic lesion. Right adrenal gland appears normal. Stable small hyperdense cyst is seen in right kidney. Stable moderate left hydroureteronephrosis is noted without obstructing calculus. This most likely is due to obstruction due to tumor in the bladder. Large mass is seen involving the anterior and left side of the urinary bladder, consistent with history of bladder carcinoma. Stomach/Bowel: The stomach appears normal. There is no evidence of bowel obstruction or inflammation. Status post appendectomy. Sigmoid diverticulosis is noted without inflammation. Vascular/Lymphatic: Aortic atherosclerosis. No enlarged abdominal or pelvic lymph nodes. Reproductive: Crescent-shaped fluid collection is seen along the left side of the prostate gland which most likely represents postsurgical change consistent with history of trans urethral resection. Other: No abdominal wall hernia or abnormality. No abdominopelvic ascites. Musculoskeletal: No acute or significant osseous findings. IMPRESSION: Increased bibasilar ill-defined densities are noted concerning for  atelectasis or possibly inflammation. Right hepatic low density is noted concerning for metastatic disease. 3.7 cm left adrenal mass is noted concerning for metastatic disease. Stable moderate left hydroureteronephrosis is noted without obstructing calculus. This most likely is due to obstruction due to large bladder tumor, which is unchanged compared to prior exam and consistent with history of bladder carcinoma. Sigmoid diverticulosis is noted without inflammation. Aortic Atherosclerosis (ICD10-I70.0). Electronically Signed   By: Marijo [place], M.D.   On: 08/29/2017 19:14   Ct Head Wo Contrast  Result Date: 08/29/2017 CLINICAL DATA:  84 year old male with history of altered mental status. EXAM: CT HEAD WITHOUT CONTRAST TECHNIQUE: Contiguous axial images were obtained from the base of the skull through the vertex without intravenous contrast. COMPARISON:  Head CT 08/05/2012. FINDINGS: Brain: Moderate cerebral and mild cerebellar atrophy. Patchy and confluent areas of decreased attenuation are noted throughout the deep and periventricular white matter of the cerebral hemispheres bilaterally, compatible with chronic microvascular ischemic disease.No evidence of acute infarction, hemorrhage, hydrocephalus, extra-axial collection or mass lesion/mass effect. Vascular: No hyperdense vessel or unexpected calcification. Skull: Normal. Negative for fracture or focal lesion. Sinuses/Orbits: No acute finding. Other: None. IMPRESSION: 1. No acute intracranial abnormalities. 2. Moderate cerebral and mild cerebellar atrophy with chronic microvascular ischemic changes in  the cerebral white matter, similar to the prior study, as above. Electronically Signed   By: Vinnie Langton M.D.   On: 08/29/2017 18:58   US Renal  Result Date: 09/05/2017 CLINICAL DATA:  84 y/o M; history of left hydronephrosis and bladder mass. EXAM: RENAL / URINARY TRACT ULTRASOUND COMPLETE COMPARISON:  08/30/2017 renal ultrasound. 08/29/2017 CT  abdomen and pelvis. FINDINGS: Right Kidney: Length: 11.0 cm. Echogenicity within normal limits. No mass or hydronephrosis visualized. Left Kidney: Length: 9.7 cm. Measurements in oblique plane, likely under sample. Stable moderate hydronephrosis. Bladder: Stable ill-defined mass within the wall of the bladder demonstrating blood flow on color Doppler. IMPRESSION: 1. Stable left moderate hydronephrosis. 2. Stable ill-defined mass within the wall of the bladder. Electronically Signed   By: Kristine Garbe M.D.   On: 09/05/2017 05:09   US Renal  Result Date: 08/30/2017 CLINICAL DATA:  Hydronephrosis. Review of radiologic records demonstrates history of bladder cancer. EXAM: RENAL / URINARY TRACT ULTRASOUND COMPLETE COMPARISON:  CT 6 hours prior.  CT 08/09/2017 FINDINGS: Right Kidney: Length: 11.0 cm. Mild prominence of the right renal pelvis without frank hydronephrosis. Echogenicity is normal. No evidence of mass. Left Kidney: Length: 12.6 cm. Moderate hydronephrosis is seen on prior CT. No shadowing stone. Bladder: Nondistended with irregular wall thickening measuring 5.8 x 1.7 x 3.6 cm with internal vascularity corresponding to mass on CT. IMPRESSION: 1. Moderate left hydronephrosis, similar to prior exams. No right hydronephrosis. 2. Nondistended urinary bladder with irregular bladder mass, previously characterized on CT. Electronically Signed   By: Jeb Levering M.D.   On: 08/30/2017 01:18   Dg Chest Port 1 View  Result Date: 09/05/2017 CLINICAL DATA:  Acute onset of tachycardia.  Altered mental status. EXAM: PORTABLE CHEST 1 VIEW COMPARISON:  Chest radiograph performed 08/29/2017 FINDINGS: The lungs are well-aerated. Mild bibasilar atelectasis is noted. There is no evidence of pleural effusion or pneumothorax. The cardiomediastinal silhouette is within normal limits. No acute osseous abnormalities are seen. IMPRESSION: Mild bibasilar atelectasis noted.  Lungs otherwise clear. Electronically  Signed   By: Garald Balding M.D.   On: 09/05/2017 00:57   Dg Chest Port 1 View  Result Date: 08/29/2017 CLINICAL DATA:  Altered mental status. EXAM: PORTABLE CHEST 1 VIEW COMPARISON:  Radiographs of December 12, 2016. FINDINGS: The heart size and mediastinal contours are within normal limits. Atherosclerosis of thoracic aorta is noted. No pneumothorax or pleural effusion is noted. Left lung is clear. Mild right basilar subsegmental atelectasis is noted. The visualized skeletal structures are unremarkable. IMPRESSION: Mild right basilar subsegmental atelectasis. Aortic Atherosclerosis (ICD10-I70.0). Electronically Signed   By: Marijo Conception, M.D.   On: 08/29/2017 17:42     The results of significant diagnostics from this hospitalization (including imaging, microbiology, ancillary and laboratory) are listed below for reference.     Microbiology: Recent Results (from the past 240 hour(s))  Blood Culture (routine x 2)     Status: None   Collection Time: 08/29/17  4:53 PM  Result Value Ref Range Status   Specimen Description   Final    RIGHT ANTECUBITAL Performed at [facility] [address], [place], [place] [address]    Special Requests   Final    BOTTLES DRAWN AEROBIC AND ANAEROBIC Blood Culture adequate volume Performed at [facility] [address], [place], [place] [address]    Culture   Final    NO GROWTH 5 DAYS Performed at [facility], [place] [address]  [place], [place] [address]    Report Status 09/04/2017 FINAL  Final  Urine culture     Status: Abnormal   Collection Time: 08/29/17  4:53 PM  Result Value Ref Range Status   Specimen Description   Final    URINE, CLEAN CATCH Performed at [facility], [place] [address], [place], [place] [address]    Special Requests   Final    NONE Performed at [facility], [place] [address] [name], [place] [address]    Culture >=100,000 COLONIES/mL  STAPHYLOCOCCUS AUREUS (A)  Final   Report Status 09/01/2017 FINAL  Final   Organism ID, Bacteria STAPHYLOCOCCUS AUREUS (A)  Final      Susceptibility   Staphylococcus aureus - MIC*    CIPROFLOXACIN <=0.5 SENSITIVE Sensitive     GENTAMICIN <=0.5 SENSITIVE Sensitive     NITROFURANTOIN <=16 SENSITIVE Sensitive     OXACILLIN 0.5 SENSITIVE Sensitive     TETRACYCLINE <=1 SENSITIVE Sensitive     VANCOMYCIN <=0.5 SENSITIVE Sensitive     TRIMETH/SULFA <=10 SENSITIVE Sensitive     CLINDAMYCIN <=0.25 SENSITIVE Sensitive     RIFAMPIN <=0.5 SENSITIVE Sensitive     Inducible Clindamycin NEGATIVE Sensitive     * >=100,000 COLONIES/mL STAPHYLOCOCCUS AUREUS  Blood Culture (routine x 2)     Status: None   Collection Time: 08/29/17  4:58 PM  Result Value Ref Range Status   Specimen Description   Final    LEFT ANTECUBITAL Performed at [facility] [address] [place], [place] [address]    Special Requests   Final    BOTTLES DRAWN AEROBIC AND ANAEROBIC Blood Culture adequate volume Performed at [facility] [address] [place], [place] [address]    Culture   Final    NO GROWTH 5 DAYS Performed at [facility], [place] [address], [place], [place] [address]    Report Status 09/04/2017 FINAL  Final  MRSA PCR Screening     Status: None   Collection Time: 08/30/17 12:47 AM  Result Value Ref Range Status   MRSA by PCR NEGATIVE NEGATIVE Final    Comment:        The GeneXpert MRSA Assay (FDA approved for NASAL specimens only), is one component of a comprehensive MRSA colonization surveillance program. It is not intended to diagnose MRSA infection nor to guide or monitor treatment for MRSA infections. Performed at [facility], [place] [address] [place], [place] [address]   Blood Culture (routine x 2)     Status: None (Preliminary result)   Collection Time: 09/04/17 11:46 PM  Result Value Ref Range Status   Specimen Description    Final    BLOOD RIGHT HAND Performed at [facility] [address], [place], [place] [address]    Special Requests   Final    BOTTLES DRAWN AEROBIC AND ANAEROBIC Blood Culture results may not be optimal due to an inadequate volume of blood received in culture bottles Performed at [place] [address] [place], [place] [address]    Culture   Final    NO GROWTH 2 DAYS Performed at [facility] [name], [place], [place] [address]    Report Status PENDING  Incomplete  Blood Culture (routine x 2)     Status: None (Preliminary result)   Collection Time: 09/04/17 11:52 PM  Result Value Ref Range Status   Specimen Description   Final  BLOOD BLOOD LEFT FOREARM Performed at [facility] [address] [place], [place] [address]    Special Requests   Final    BOTTLES DRAWN AEROBIC AND ANAEROBIC Blood Culture adequate volume Performed at [facility] [address], [place], [place] [address]    Culture   Final    NO GROWTH 2 DAYS Performed at [facility] [place] [address], [place], [place] [address]    Report Status PENDING  Incomplete  Culture, Urine     Status: Abnormal   Collection Time: 09/05/17 12:59 AM  Result Value Ref Range Status   Specimen Description   Final    URINE, CATHETERIZED Performed at [facility] [address], [place], [place] [address]    Special Requests   Final    NONE Performed at [facility], [place] [address] [place], [place] [address]    Culture MULTIPLE SPECIES PRESENT, SUGGEST RECOLLECTION (A)  Final   Report Status 09/06/2017 FINAL  Final  MRSA PCR Screening     Status: None   Collection Time: 09/05/17  2:50 PM  Result Value Ref Range Status   MRSA by PCR NEGATIVE NEGATIVE Final    Comment:        The GeneXpert MRSA Assay (FDA approved for NASAL specimens only), is one component of a comprehensive  MRSA colonization surveillance program. It is not intended to diagnose MRSA infection nor to guide or monitor treatment for MRSA infections. Performed at [facility], [place] [address] [place], [place] [address]      Labs: BNP (last 3 results) No results for input(s): BNP in the last 8760 hours. Basic Metabolic Panel: Recent Labs  Lab 09/02/17 1247  09/03/17 0354 09/04/17 2353 09/05/17 0525 09/06/17 0403 09/07/17 0402 09/08/17 0401  NA  --   --  139 140 142 149* 148*  --   K 2.8*   < > 4.0 3.1* 4.5 3.1* 3.7  --   CL  --   --  100* 102 108 115* 115*  --   CO2  --   --  26 21* 24 24 21*  --   GLUCOSE  --   --  165* 172* 194* 118* 131*  --   BUN  --   --  28* 38* 36* 44* 49*  --   CREATININE  --   --  1.53* 2.33* 2.38* 2.39* 2.75* 3.12*  CALCIUM  --   --  7.9* 8.0* 7.2* 7.5* 7.5*  --   MG 2.1  --  2.0  --  2.0  --   --   --    < > = values in this interval not displayed.   Liver Function Tests: Recent Labs  Lab 09/04/17 2353 09/05/17 0525 09/06/17 0403  AST 189* 703* 255*  ALT 98* 206* 189*  ALKPHOS 516* 517* 516*  BILITOT 1.2 1.7* 1.2  PROT 7.0 6.3* 6.2*  ALBUMIN 2.3* 2.0* 1.8*   No results for input(s): LIPASE, AMYLASE in the last 168 hours. No results for input(s): AMMONIA in the last 168 hours. CBC: Recent Labs  Lab 09/02/17 0339 09/04/17 2353 09/05/17 0525 09/07/17 0402  WBC 14.4* 10.4 4.8 7.8  NEUTROABS  --  9.1* 3.7 6.8  HGB 13.5 12.7* 12.1* 10.9*  HCT 41.5 39.4 38.1* 35.1*  MCV 87.9 87.4 87.6 88.2  PLT 239 170 131* 122*   Cardiac Enzymes: No results for input(s): CKTOTAL, CKMB, CKMBINDEX, TROPONINI  in the last 168 hours. BNP: Invalid input(s): POCBNP CBG: Recent Labs  Lab 09/07/17 2159 09/08/17 0155 09/08/17 0441 09/08/17 0756 09/08/17 1135  GLUCAP 116* 163* 118* 139* 121*   D-Dimer No results for input(s): DDIMER in the last 72 hours. Hgb A1c No results for input(s): HGBA1C in the last 72 hours. Lipid Profile No  results for input(s): CHOL, HDL, LDLCALC, TRIG, CHOLHDL, LDLDIRECT in the last 72 hours. Thyroid function studies No results for input(s): TSH, T4TOTAL, T3FREE, THYROIDAB in the last 72 hours.  Invalid input(s): FREET3 Anemia work up No results for input(s): VITAMINB12, FOLATE, FERRITIN, TIBC, IRON, RETICCTPCT in the last 72 hours. Urinalysis    Component Value Date/Time   COLORURINE YELLOW 09/05/2017 0059   APPEARANCEUR TURBID (A) 09/05/2017 0059   LABSPEC 1.013 09/05/2017 0059   PHURINE 6.0 09/05/2017 0059   GLUCOSEU 0 (A) 09/05/2017 0059   HGBUR 3 (A) 09/05/2017 0059   BILIRUBINUR 0 (A) 09/05/2017 0059   KETONESUR 0 (A) 09/05/2017 0059   PROTEINUR 300 (A) 09/05/2017 0059   NITRITE 0 (A) 09/05/2017 0059   LEUKOCYTESUR 2 (A) 09/05/2017 0059   Sepsis Labs Invalid input(s): PROCALCITONIN,  WBC,  LACTICIDVEN Microbiology Recent Results (from the past 240 hour(s))  Blood Culture (routine x 2)     Status: None   Collection Time: 08/29/17  4:53 PM  Result Value Ref Range Status   Specimen Description   Final    RIGHT ANTECUBITAL Performed at [facility], [place] [address], [place], [place] [address]    Special Requests   Final    BOTTLES DRAWN AEROBIC AND ANAEROBIC Blood Culture adequate volume Performed at [place] [address] [place], [place] [address]    Culture   Final    NO GROWTH 5 DAYS Performed at [facility], [place] [address] [place], [place] [address]    Report Status 09/04/2017 FINAL  Final  Urine culture     Status: Abnormal   Collection Time: 08/29/17  4:53 PM  Result Value Ref Range Status   Specimen Description   Final    URINE, CLEAN CATCH Performed at [facility], [place] [address], [place], [place] [address]    Special Requests   Final    NONE Performed at [facility], [place] [address], [place], [place] [address]    Culture >=100,000 COLONIES/mL STAPHYLOCOCCUS AUREUS  (A)  Final   Report Status 09/01/2017 FINAL  Final   Organism ID, Bacteria STAPHYLOCOCCUS AUREUS (A)  Final      Susceptibility   Staphylococcus aureus - MIC*    CIPROFLOXACIN <=0.5 SENSITIVE Sensitive     GENTAMICIN <=0.5 SENSITIVE Sensitive     NITROFURANTOIN <=16 SENSITIVE Sensitive     OXACILLIN 0.5 SENSITIVE Sensitive     TETRACYCLINE <=1 SENSITIVE Sensitive     VANCOMYCIN <=0.5 SENSITIVE Sensitive     TRIMETH/SULFA <=10 SENSITIVE Sensitive     CLINDAMYCIN <=0.25 SENSITIVE Sensitive     RIFAMPIN <=0.5 SENSITIVE Sensitive     Inducible Clindamycin NEGATIVE Sensitive     * >=100,000 COLONIES/mL STAPHYLOCOCCUS AUREUS  Blood Culture (routine x 2)     Status: None   Collection Time: 08/29/17  4:58 PM  Result Value Ref Range Status   Specimen Description   Final    LEFT ANTECUBITAL Performed at [facility] [address] [place],  [address]    Special Requests   Final    BOTTLES DRAWN  AEROBIC AND ANAEROBIC Blood Culture adequate volume Performed at [facility] [address], [place], [place] [address]    Culture   Final    NO GROWTH 5 DAYS Performed at [facility], [place] [address], [place], [place] [address]    Report Status 09/04/2017 FINAL  Final  MRSA PCR Screening     Status: None   Collection Time: 08/30/17 12:47 AM  Result Value Ref Range Status   MRSA by PCR NEGATIVE NEGATIVE Final    Comment:        The GeneXpert MRSA Assay (FDA approved for NASAL specimens only), is one component of a comprehensive MRSA colonization surveillance program. It is not intended to diagnose MRSA infection nor to guide or monitor treatment for MRSA infections. Performed at [facility], [place] [address] [place], [place] [address]   Blood Culture (routine x 2)     Status: None (Preliminary result)   Collection Time: 09/04/17 11:46 PM  Result Value Ref Range Status   Specimen Description   Final    BLOOD  RIGHT HAND Performed at [facility] [place] [address] [place], [place] [address]    Special Requests   Final    BOTTLES DRAWN AEROBIC AND ANAEROBIC Blood Culture results may not be optimal due to an inadequate volume of blood received in culture bottles Performed at [place] [address] [place], [place] [address]    Culture   Final    NO GROWTH 2 DAYS Performed at [facility] [address], [place], [place] [address]    Report Status PENDING  Incomplete  Blood Culture (routine x 2)     Status: None (Preliminary result)   Collection Time: 09/04/17 11:52 PM  Result Value Ref Range Status   Specimen Description   Final    BLOOD BLOOD LEFT FOREARM Performed at [facility] [name] [place], [place] [address]    Special Requests   Final    BOTTLES DRAWN AEROBIC AND ANAEROBIC Blood Culture adequate volume Performed at [facility] [address], [place], [place] [address]    Culture   Final    NO GROWTH 2 DAYS Performed at [facility] [address], [place], [place] [address]    Report Status PENDING  Incomplete  Culture, Urine     Status: Abnormal   Collection Time: 09/05/17 12:59 AM  Result Value Ref Range Status   Specimen Description   Final    URINE, CATHETERIZED Performed at [facility] [address], [place], [place] [address]    Special Requests   Final    NONE Performed at [facility], [place] [address], [place], [place] [address]    Culture MULTIPLE SPECIES PRESENT, SUGGEST RECOLLECTION (A)  Final   Report Status 09/06/2017 FINAL  Final  MRSA PCR Screening     Status: None   Collection Time: 09/05/17  2:50 PM  Result Value Ref Range Status   MRSA by PCR NEGATIVE NEGATIVE Final    Comment:        The GeneXpert MRSA Assay (FDA approved for NASAL specimens only), is one component of a comprehensive MRSA  colonization surveillance program. It is not intended to diagnose MRSA infection nor to guide or monitor treatment for MRSA infections. Performed at [facility], [place] [address], [place], [place] [address]      Time coordinating  discharge: Over 30 minutes  SIGNED:   Debbe Odea, MD  [facility] 09/08/2017, 12:21 PM Pager   If 7PM-7AM, please contact night-coverage [email or web address] Password [email or web address]

## 2017-09-08 NOTE — Progress Notes (Addendum)
Per [facility] no current bed availabilty at [facility].  Pt sleeping, no family at bedside- CSW left voicemail for daughter [name] in order to discuss other facility options.   10:26: Daughter returned call and agreed to referral for pt to [facility] home.  Sharren Bridge, MSW, LCSW Clinical Social Work 09/08/2017 [phone]

## 2017-09-09 ENCOUNTER — Ambulatory Visit: Payer: Medicare Other

## 2017-09-10 ENCOUNTER — Ambulatory Visit: Payer: Medicare Other

## 2017-09-10 LAB — CULTURE, BLOOD (ROUTINE X 2)
CULTURE: NO GROWTH
CULTURE: NO GROWTH
Special Requests: ADEQUATE

## 2017-09-14 ENCOUNTER — Telehealth: Payer: Self-pay | Admitting: *Deleted

## 2017-09-14 NOTE — Telephone Encounter (Signed)
Received voice mail message from Kallie Locks, son in law, that stated,"i need to cancel all appointments for Mr. Clum. He has passed away. My return number is [phone]." LOS sent to scheduling to cancel all future appointments.

## 2017-09-21 ENCOUNTER — Encounter: Payer: Self-pay | Admitting: Radiation Oncology

## 2017-09-21 NOTE — Progress Notes (Signed)
  Radiation Oncology         [phone] ________________________________  Name: [name] Cook MRN: [id number]  Date: 09/21/2017  DOB: 1926/12/24  End of Treatment Note  Diagnosis: 84 y.o. 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 During treatment he complained of persistnet rectal pain/pressure but denied fatigue, nausea/ vomiting, or rectal bleeding.  He did not experience any increased lower urinary tract symptoms and denied any skin irritation in the treatment field.  Plan: The patient has completed radiation treatment. The patient will return to radiation oncology clinic for routine followup in one month. I advised him to call or return sooner if he has any questions or concerns related to his recovery or treatment. ________________________________  Sheral Apley. Tammi Klippel, M.D.   This document serves as a record of services personally performed by Tyler Pita, MD. It was created on his behalf by Bethann Humble, a trained medical scribe. The creation of this record is based on the scribe's personal observations and the provider's statements to them. This document has been checked and approved by the attending provider.

## 2017-09-27 DEATH — deceased

## 2017-10-05 ENCOUNTER — Ambulatory Visit: Payer: POS | Admitting: Oncology

## 2017-10-08 ENCOUNTER — Ambulatory Visit: Payer: Self-pay | Admitting: Urology

## 2018-07-06 IMAGING — US US RENAL
1 series · 14 of 25 positions shown · non-contrast
Comparison: CT 6 hours prior.  CT 08/09/2017

CLINICAL DATA: Hydronephrosis. Review of radiologic records
demonstrates history of bladder cancer.

EXAM:
RENAL / URINARY TRACT ULTRASOUND COMPLETE

[Series 1: us renal · 0.26mm/px · 14 of 45 slices shown]
[im 1/45]
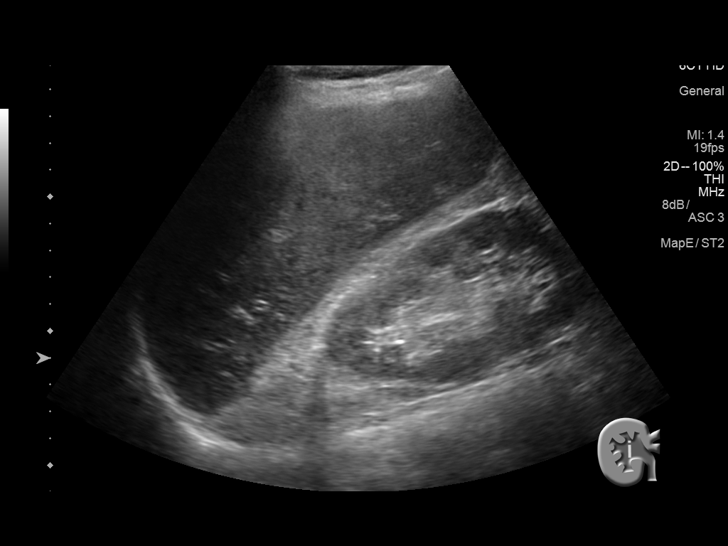
[im 4/45]
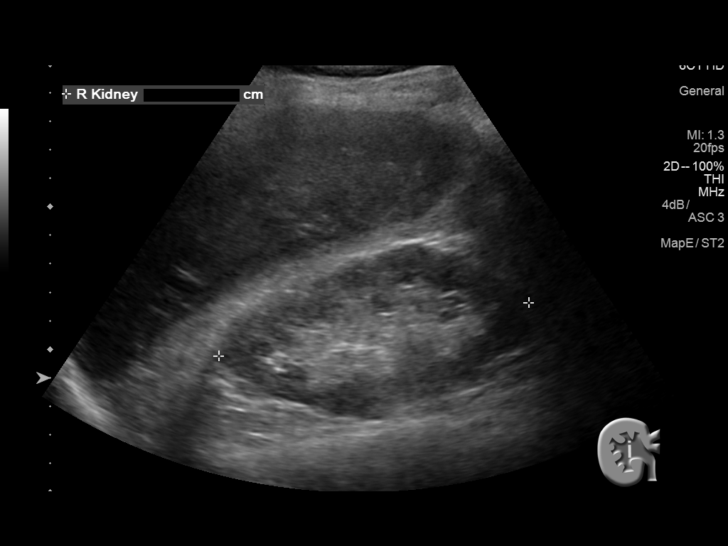
[im 8/45]
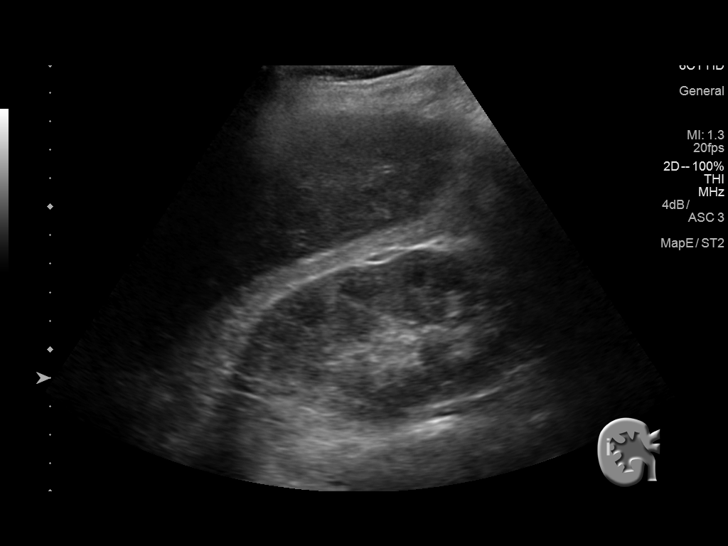
[im 12/45]
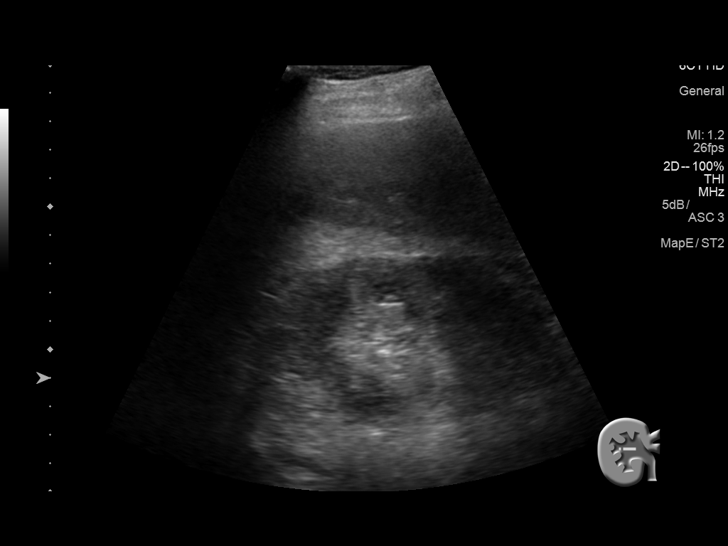
[im 15/45]
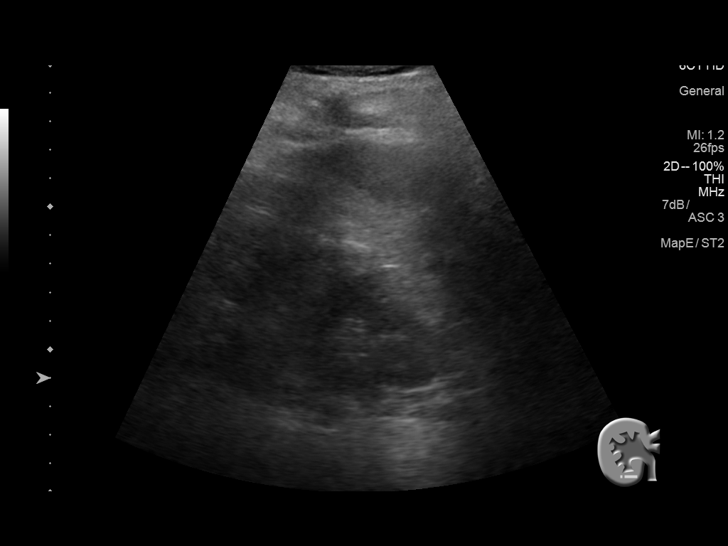
[im 17/45]
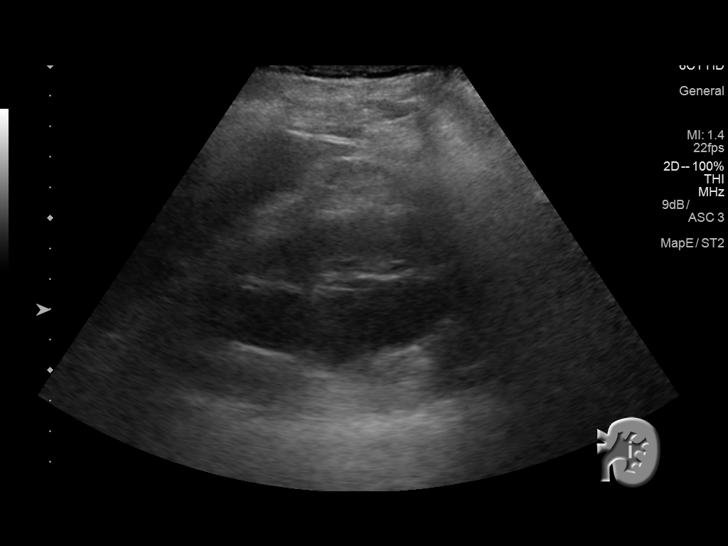
[im 21/45]
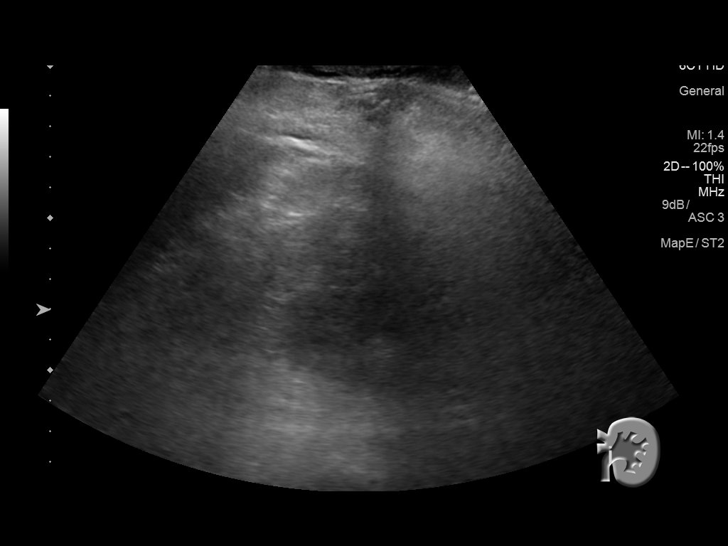
[im 24/45]
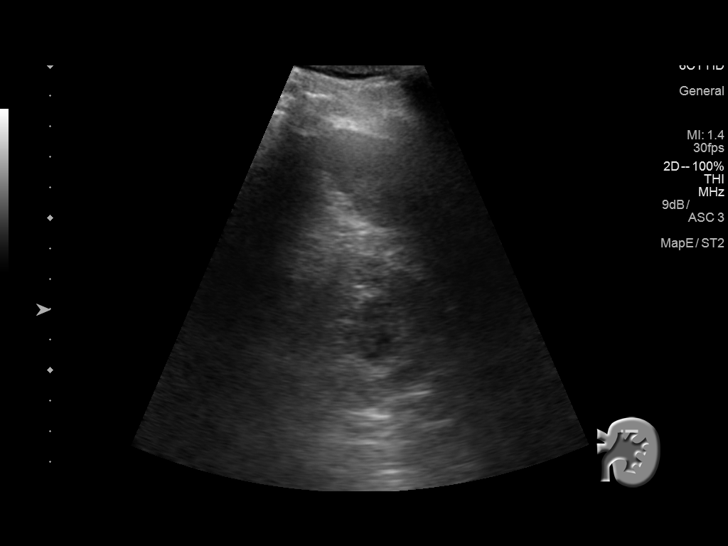
[im 28/45]
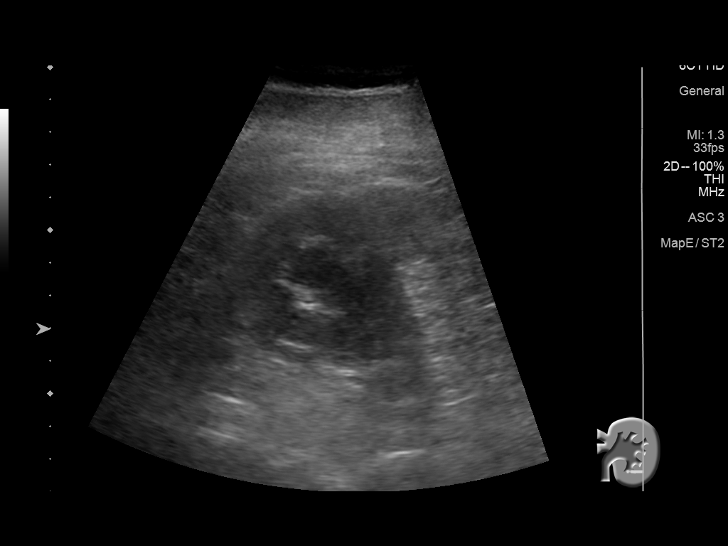
[im 30/45]
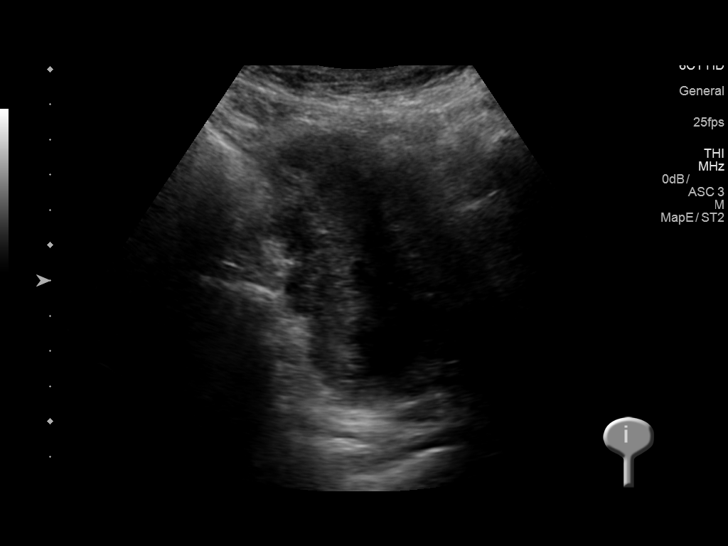
[im 34/45]
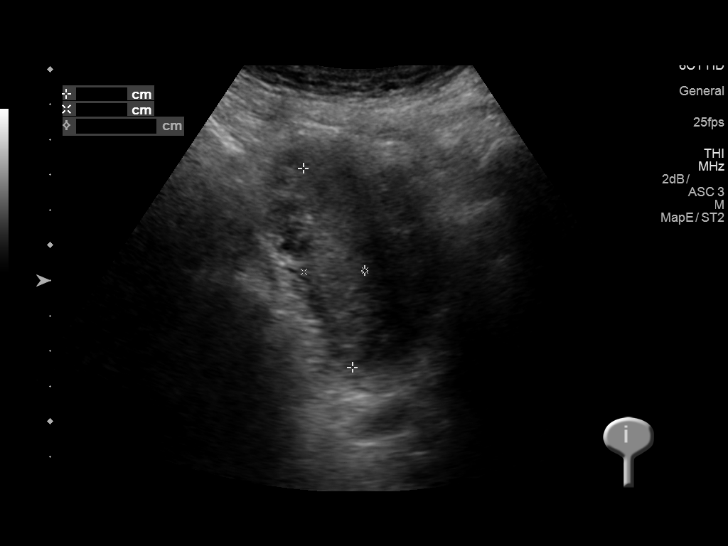
[im 37/45]
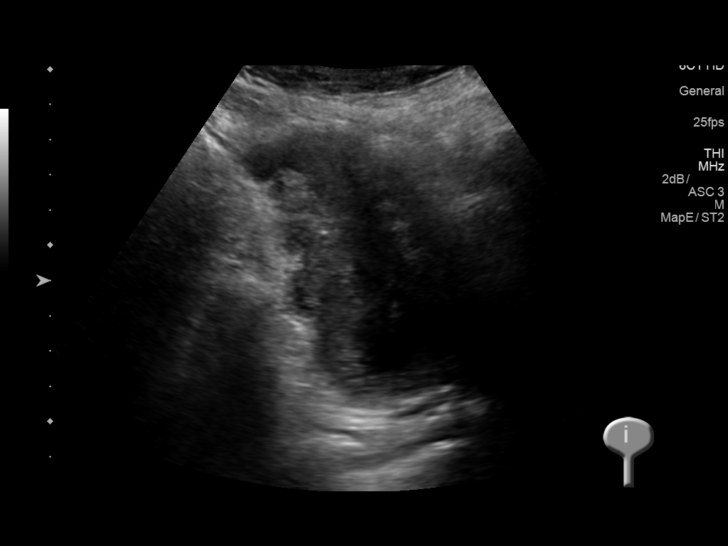
[im 41/45]
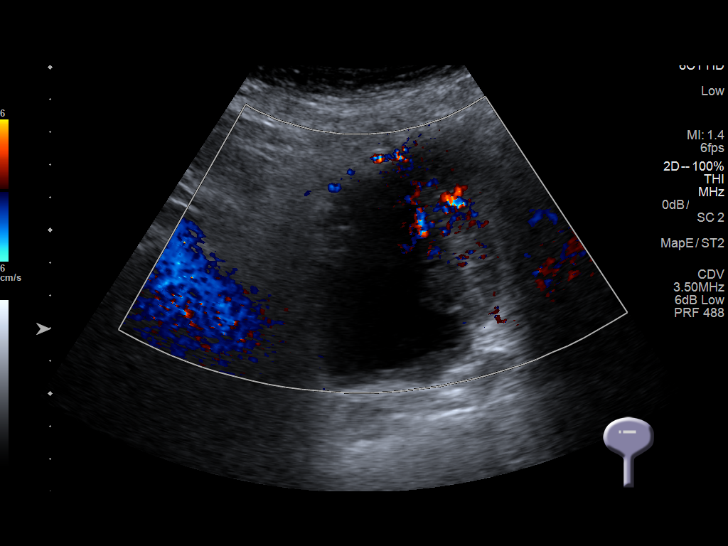
[im 45/45]
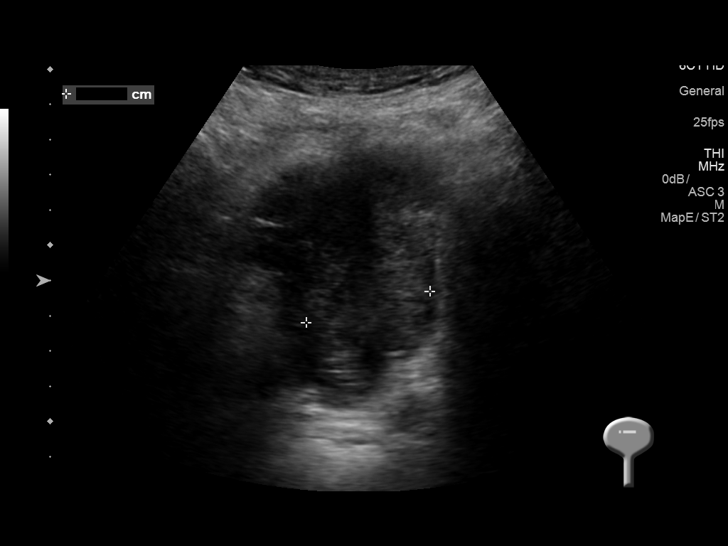

[14 of 25 positions shown; findings below may reference images not displayed]

FINDINGS: Right Kidney:

Length: 11.0 cm. Mild prominence of the right renal pelvis without
frank hydronephrosis. Echogenicity is normal. No evidence of mass.

Left Kidney:

Length: 12.6 cm.. Moderate hydronephrosis is seen on prior CT. No
shadowing stone.

Bladder:

Nondistended with irregular wall thickening measuring 5.8 x 1.7 x
3.6 cm with internal vascularity corresponding to mass on CT.
IMPRESSION: 1. Moderate left hydronephrosis, similar to prior exams. No right
hydronephrosis.
2. Nondistended urinary bladder with irregular bladder mass,
previously characterized on CT.

## 2018-07-12 IMAGING — US US RENAL
1 series · 14 of 25 positions shown · non-contrast
Comparison: 08/30/2017 renal ultrasound. 08/29/2017 CT abdomen and
pelvis.

CLINICAL DATA: 89 y/o M; history of left hydronephrosis and bladder
mass.

EXAM:
RENAL / URINARY TRACT ULTRASOUND COMPLETE

[Series 1: us renal · 0.28mm/px · 49 acquisitions, 14 frames shown]
[im 1/49]
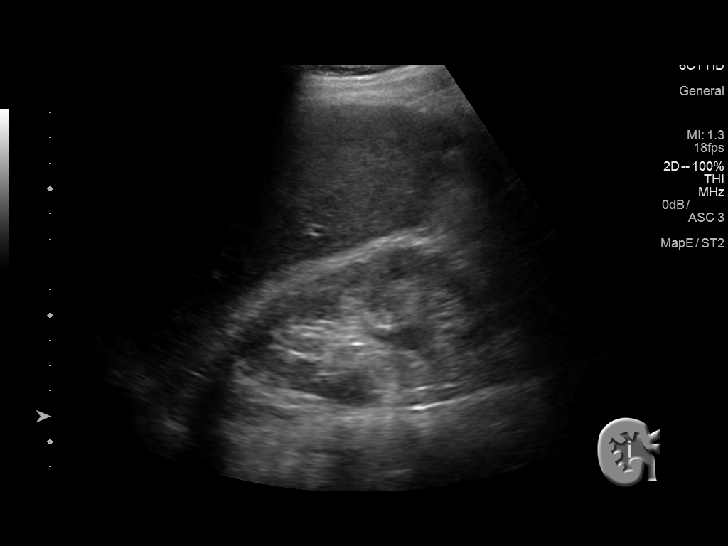
[im 5/49]
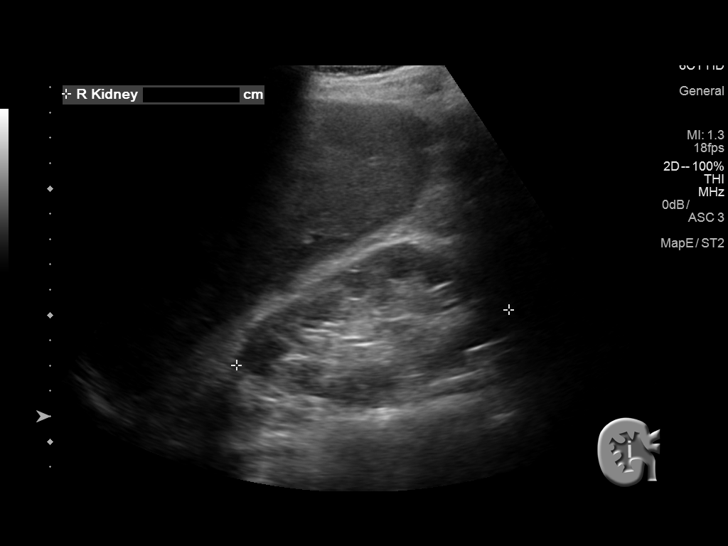
[im 9/49]
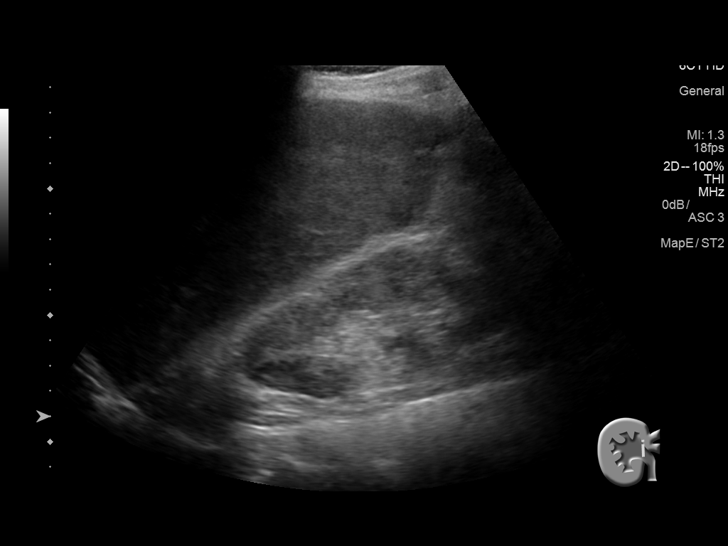
[im 13/49]
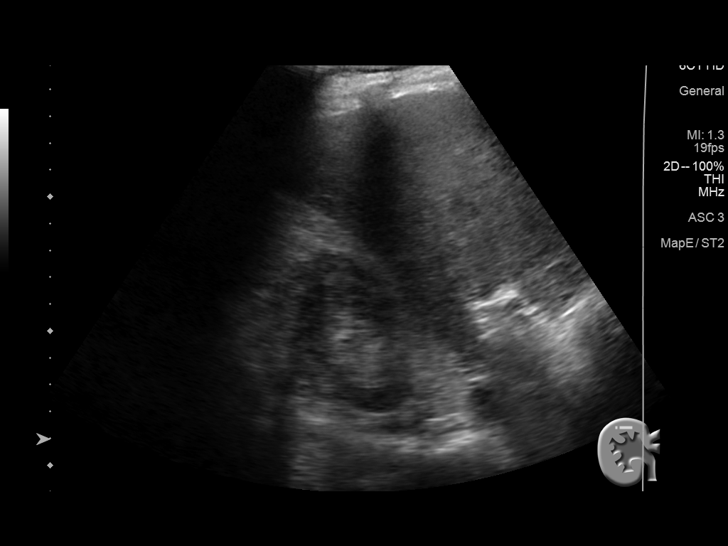
[im 17/49]
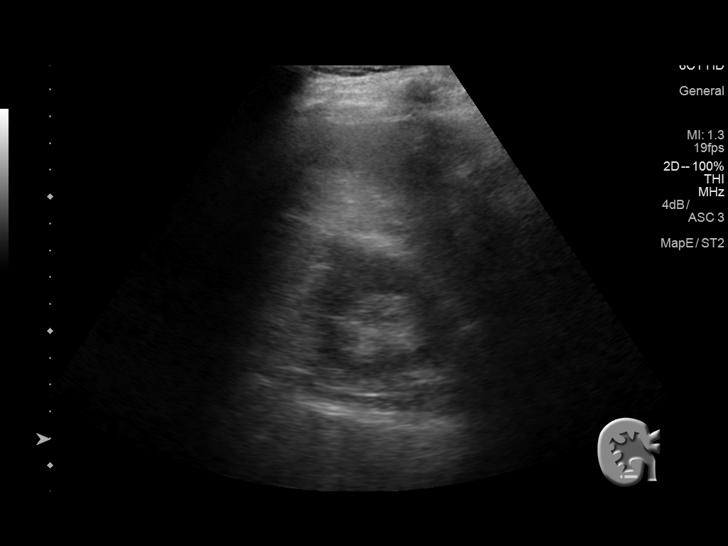
[im 19/49]
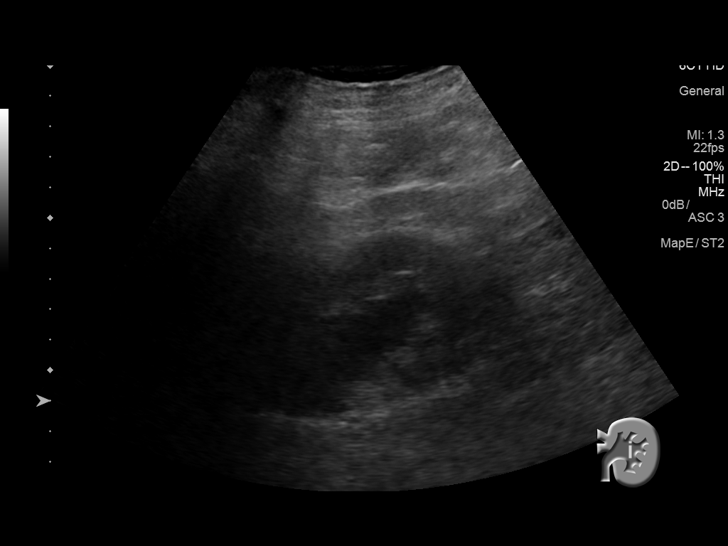
[im 23/49]
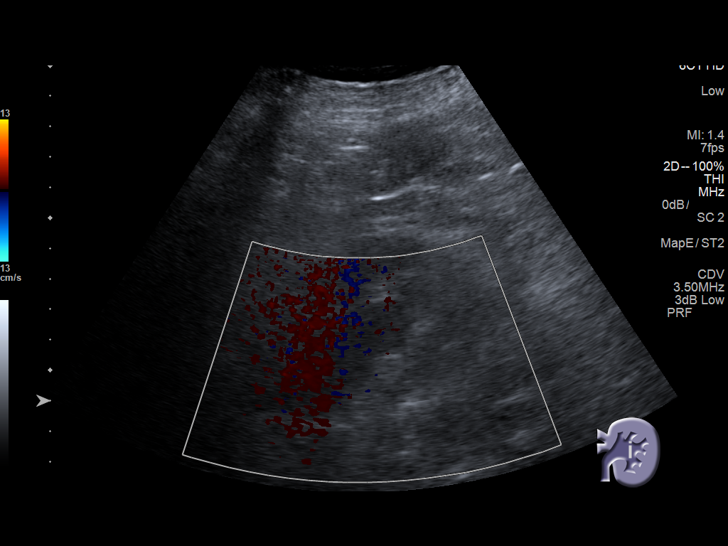
[im 27/49]
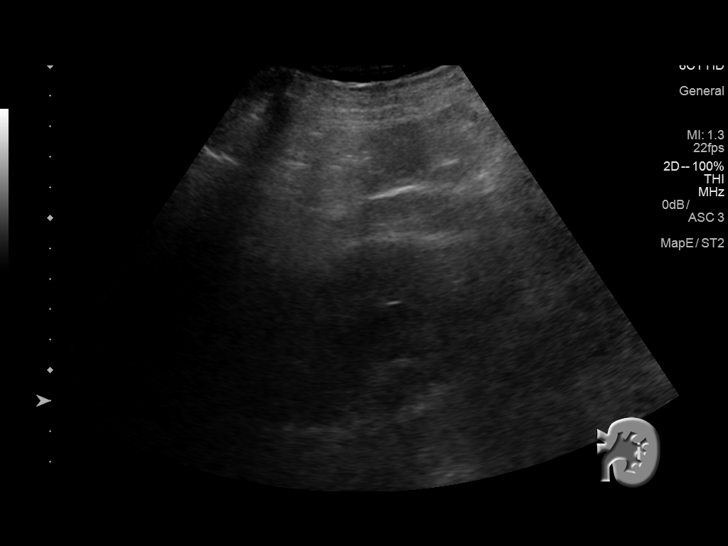
[im 31/49]
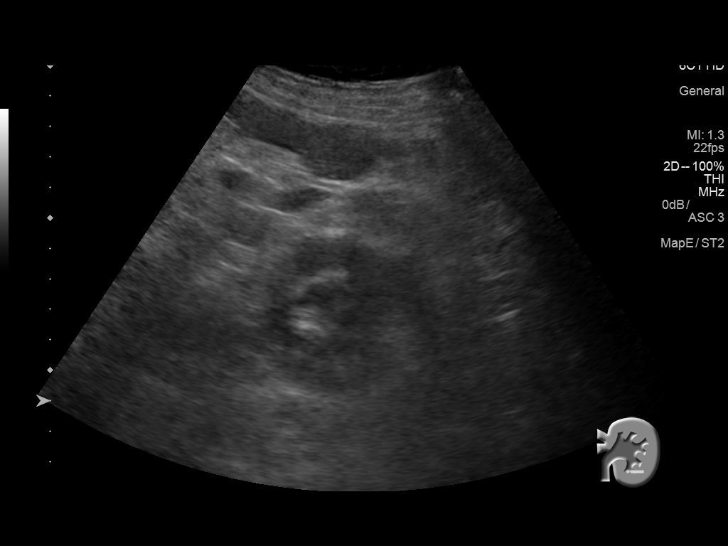
[im 33/49]
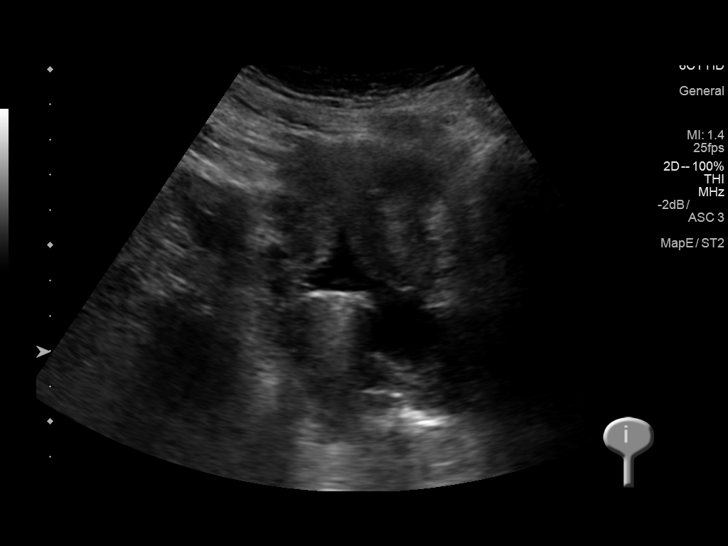
[im 37/49]
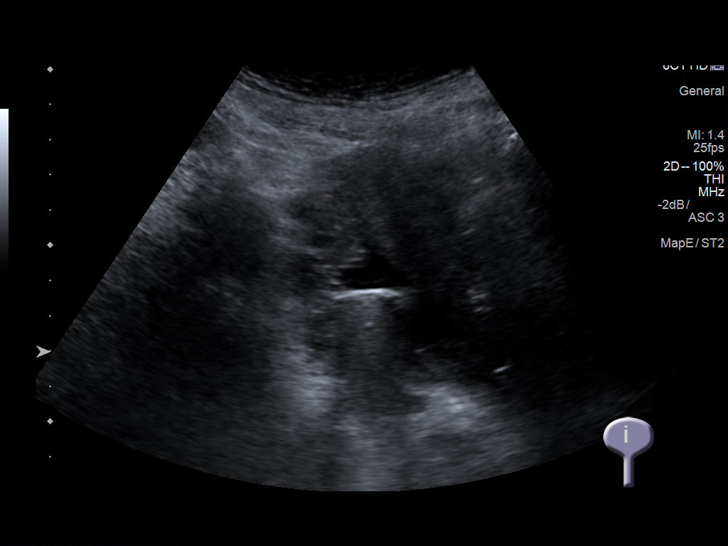
[im 41/49]
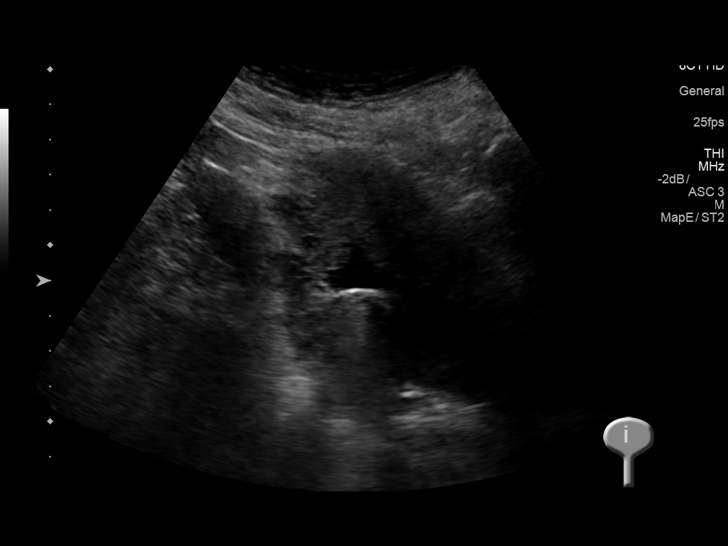
[im 45/49]
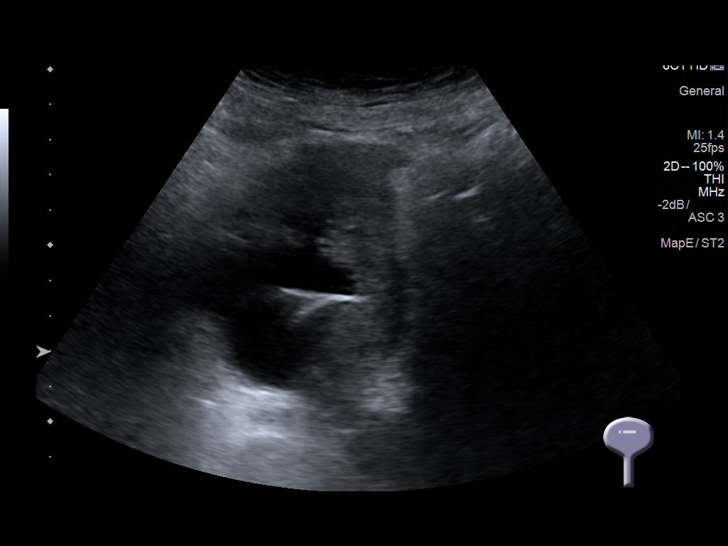
[im 49/49]
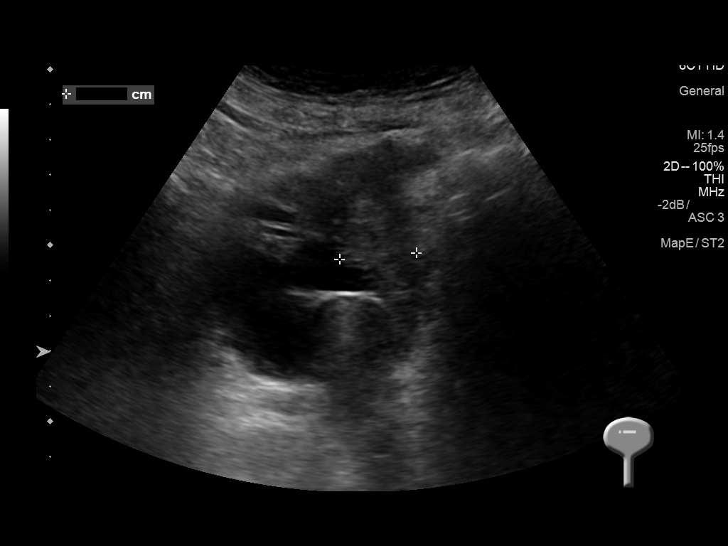

[14 of 25 positions shown; findings below may reference images not displayed]

FINDINGS: Right Kidney:

Length: 11.0 cm. Echogenicity within normal limits. No mass or
hydronephrosis visualized.

Left Kidney:

Length: 9.7 cm. Measurements in oblique plane, likely under sample.
Stable moderate hydronephrosis.

Bladder:

Stable ill-defined mass within the wall of the bladder demonstrating
blood flow on color Doppler.
IMPRESSION: 1. Stable left moderate hydronephrosis.
2. Stable ill-defined mass within the wall of the bladder.

By: Brianita Hanampa M.D.

## 2019-02-12 IMAGING — DX DG CHEST 2V
2 series · 2 of 2 positions shown · non-contrast
Comparison: Chest radiograph performed 08/30/2015

CLINICAL DATA: Acute onset of midsternal chest pain, nausea and
vomiting. Initial encounter.

EXAM:
CHEST  2 VIEW

[chest lat]
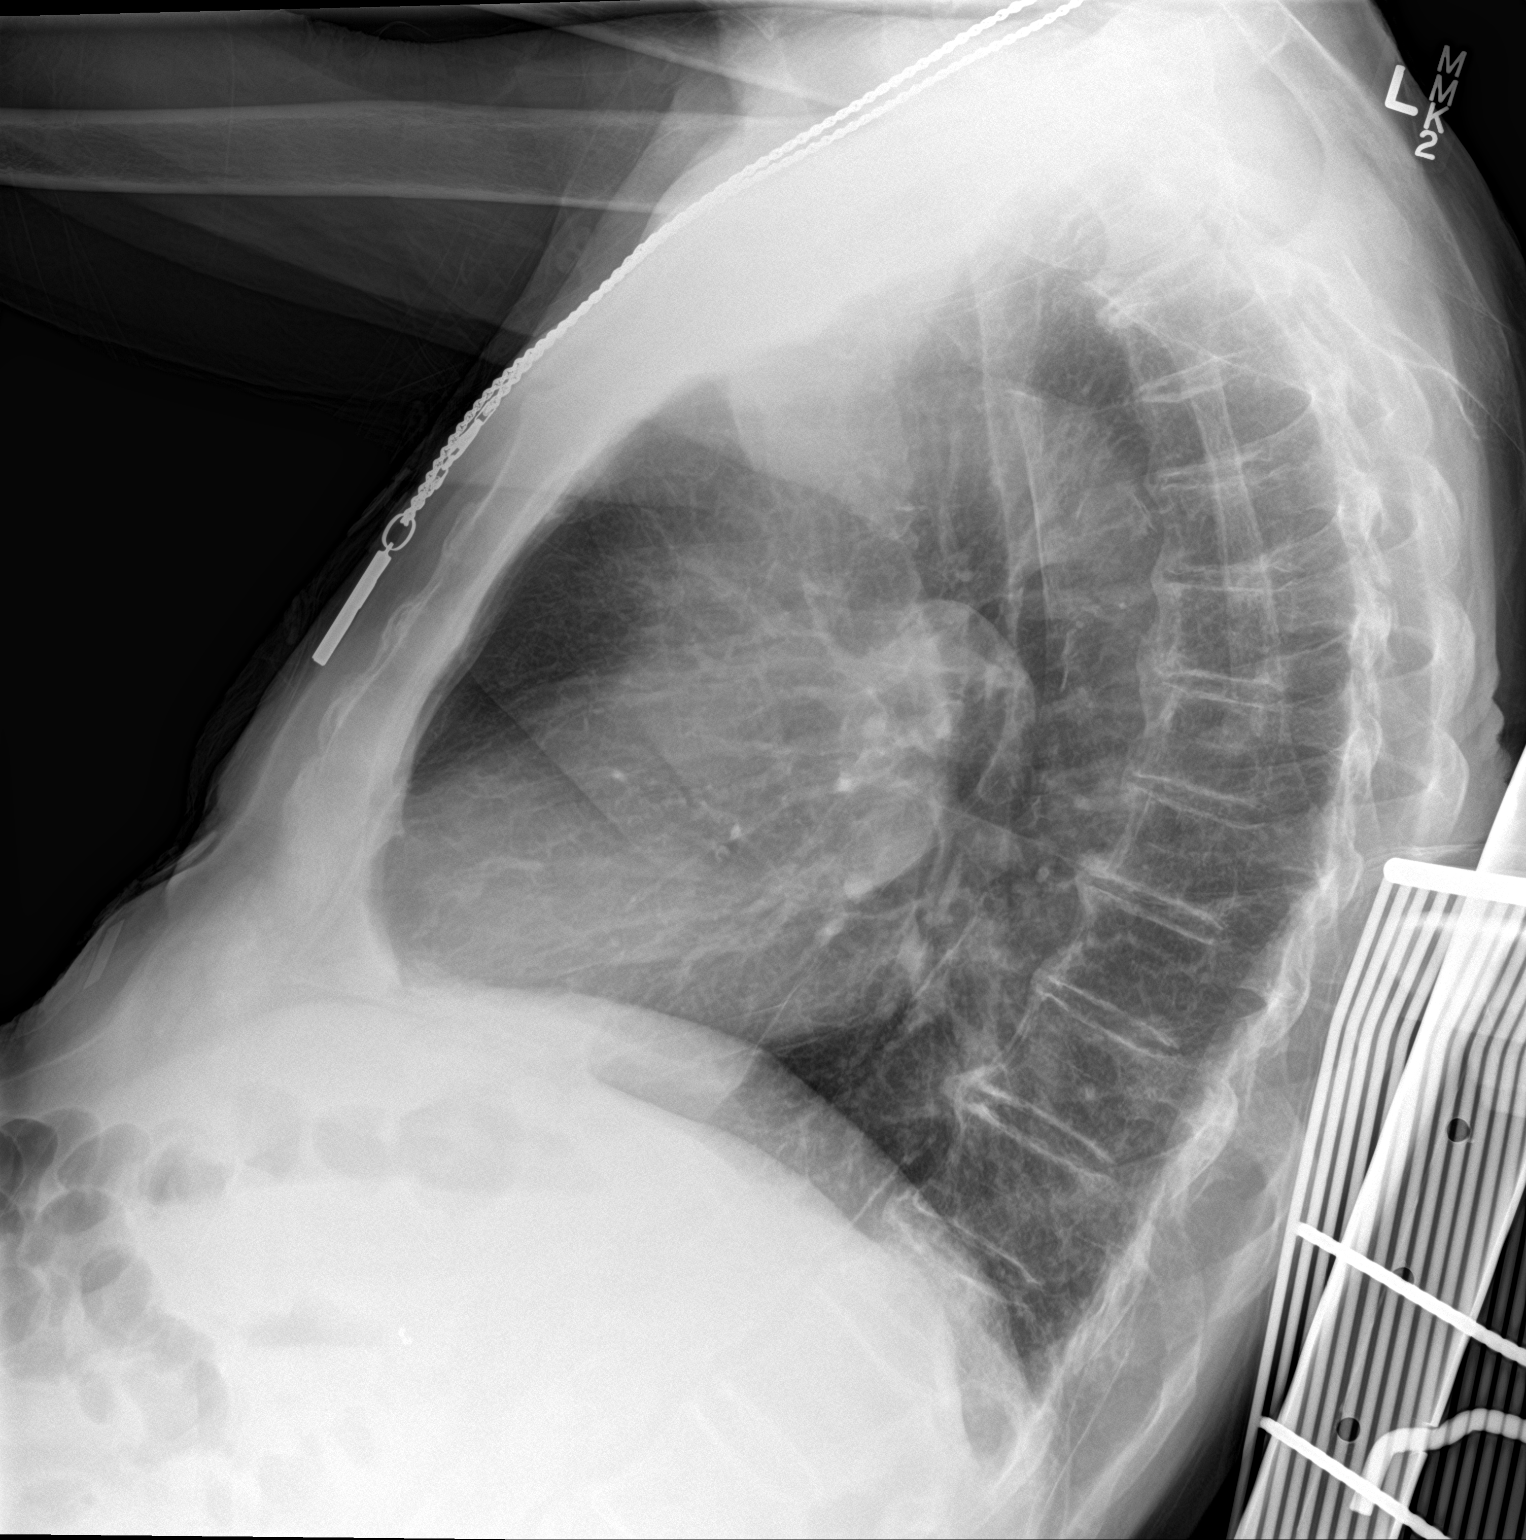

[chest ap]
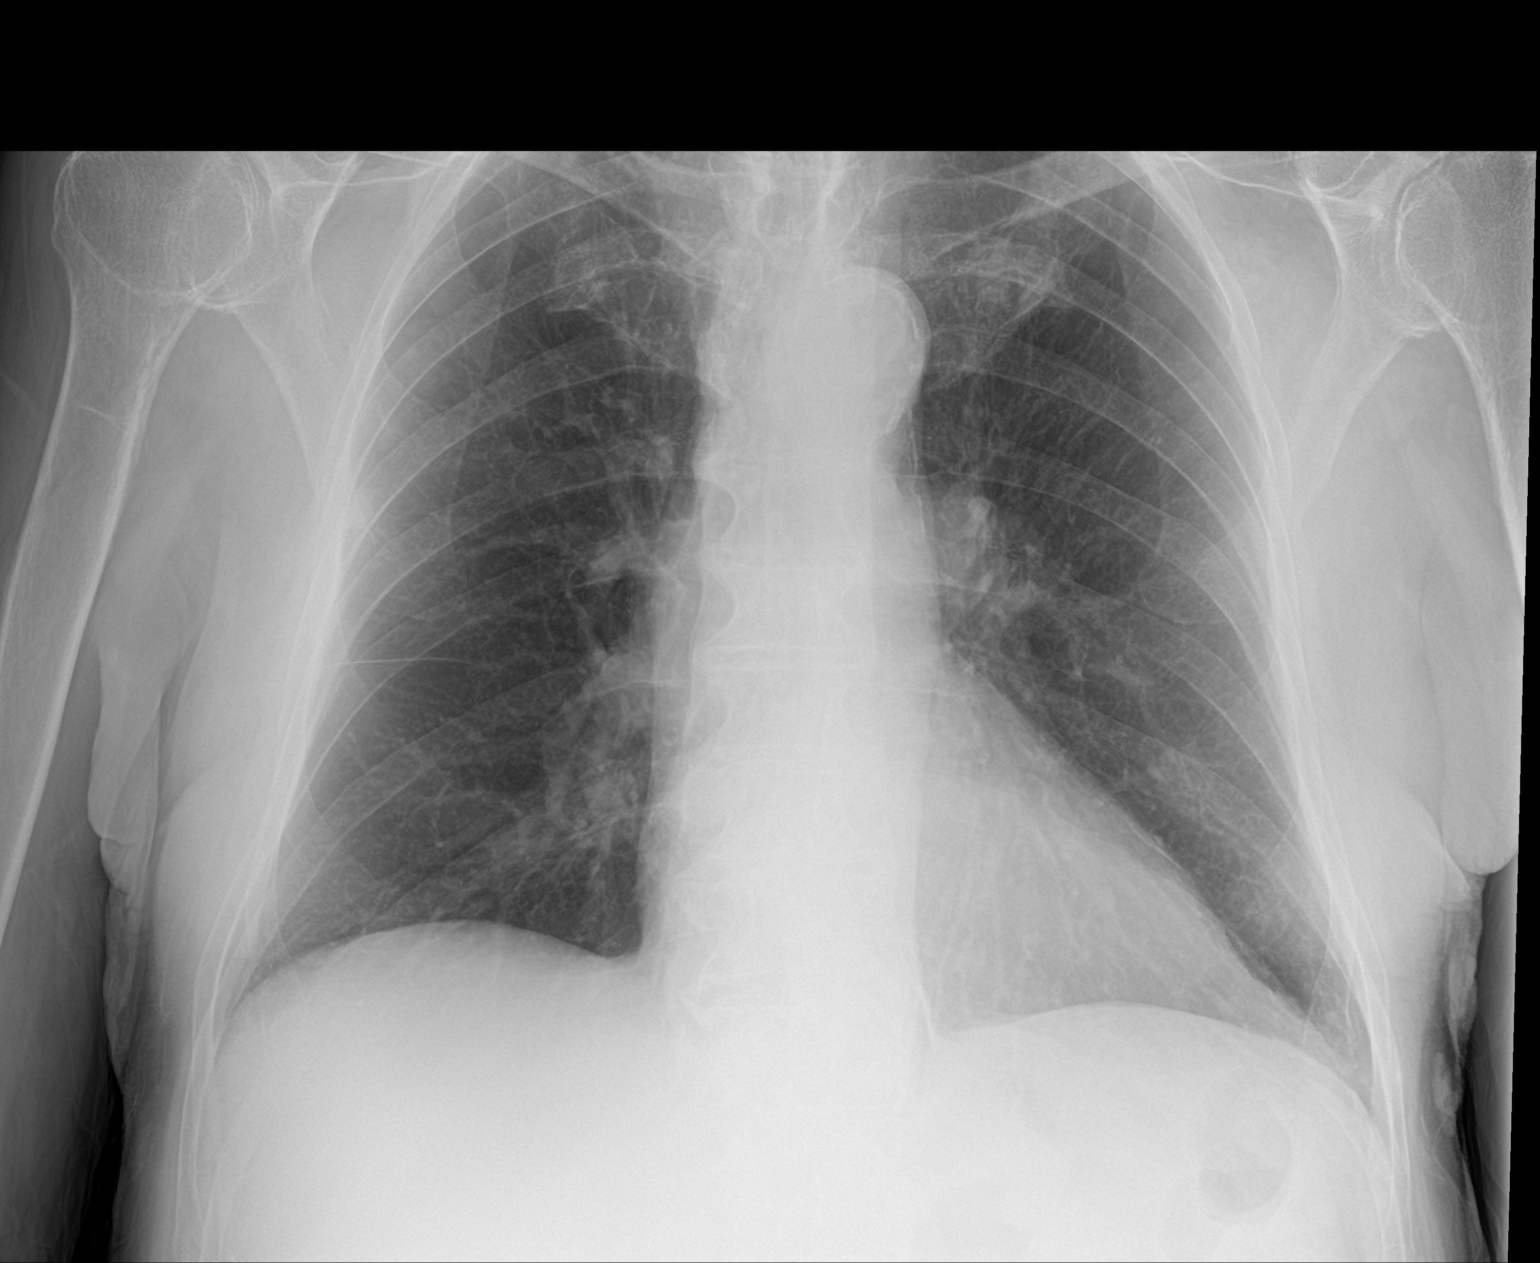

[2 of 2 positions shown; findings below may reference images not displayed]

FINDINGS: The lungs are well-aerated and clear. There is no evidence of focal
opacification, pleural effusion or pneumothorax.

The heart is normal in size; the mediastinal contour is within
normal limits. No acute osseous abnormalities are seen.
IMPRESSION: No acute cardiopulmonary process seen.
